# Patient Record
Sex: Male | Born: 1960 | Race: White | Hispanic: No | Marital: Married | Smoking: Never smoker
Health system: Southern US, Community
[De-identification: ages and names within clinical notes are randomized; demographics above are authoritative.]

## PROBLEM LIST (undated history)

## (undated) DIAGNOSIS — G4733 Obstructive sleep apnea (adult) (pediatric): Secondary | ICD-10-CM

## (undated) DIAGNOSIS — G4730 Sleep apnea, unspecified: Secondary | ICD-10-CM

## (undated) DIAGNOSIS — Z87442 Personal history of urinary calculi: Secondary | ICD-10-CM

## (undated) DIAGNOSIS — F419 Anxiety disorder, unspecified: Secondary | ICD-10-CM

## (undated) DIAGNOSIS — C44611 Basal cell carcinoma of skin of unspecified upper limb, including shoulder: Secondary | ICD-10-CM

## (undated) DIAGNOSIS — M9112 Juvenile osteochondrosis of head of femur [Legg-Calve-Perthes], left leg: Secondary | ICD-10-CM

## (undated) DIAGNOSIS — I83899 Varicose veins of unspecified lower extremities with other complications: Secondary | ICD-10-CM

## (undated) DIAGNOSIS — K611 Rectal abscess: Secondary | ICD-10-CM

## (undated) DIAGNOSIS — R7303 Prediabetes: Secondary | ICD-10-CM

## (undated) DIAGNOSIS — I872 Venous insufficiency (chronic) (peripheral): Secondary | ICD-10-CM

## (undated) DIAGNOSIS — T8859XA Other complications of anesthesia, initial encounter: Secondary | ICD-10-CM

## (undated) DIAGNOSIS — N400 Enlarged prostate without lower urinary tract symptoms: Secondary | ICD-10-CM

## (undated) DIAGNOSIS — I10 Essential (primary) hypertension: Secondary | ICD-10-CM

## (undated) DIAGNOSIS — D229 Melanocytic nevi, unspecified: Secondary | ICD-10-CM

## (undated) DIAGNOSIS — K458 Other specified abdominal hernia without obstruction or gangrene: Secondary | ICD-10-CM

## (undated) DIAGNOSIS — E291 Testicular hypofunction: Secondary | ICD-10-CM

## (undated) DIAGNOSIS — E559 Vitamin D deficiency, unspecified: Secondary | ICD-10-CM

## (undated) DIAGNOSIS — E785 Hyperlipidemia, unspecified: Secondary | ICD-10-CM

## (undated) DIAGNOSIS — M6208 Separation of muscle (nontraumatic), other site: Secondary | ICD-10-CM

## (undated) DIAGNOSIS — K5792 Diverticulitis of intestine, part unspecified, without perforation or abscess without bleeding: Secondary | ICD-10-CM

## (undated) DIAGNOSIS — J342 Deviated nasal septum: Secondary | ICD-10-CM

## (undated) DIAGNOSIS — N200 Calculus of kidney: Secondary | ICD-10-CM

## (undated) HISTORY — DX: Sleep apnea, unspecified: G47.30

## (undated) HISTORY — DX: Anxiety disorder, unspecified: F41.9

## (undated) HISTORY — DX: Basal cell carcinoma of skin of unspecified upper limb, including shoulder: C44.611

## (undated) HISTORY — DX: Obstructive sleep apnea (adult) (pediatric): G47.33

## (undated) HISTORY — DX: Varicose veins of unspecified lower extremity with other complications: I83.899

## (undated) HISTORY — DX: Hyperlipidemia, unspecified: E78.5

## (undated) HISTORY — DX: Other specified abdominal hernia without obstruction or gangrene: K45.8

## (undated) HISTORY — DX: Benign prostatic hyperplasia without lower urinary tract symptoms: N40.0

## (undated) HISTORY — DX: Testicular hypofunction: E29.1

## (undated) HISTORY — DX: Deviated nasal septum: J34.2

## (undated) HISTORY — DX: Rectal abscess: K61.1

## (undated) HISTORY — DX: Juvenile osteochondrosis of head of femur (legg-calve-perthes), left leg: M91.12

## (undated) HISTORY — DX: Diverticulitis of intestine, part unspecified, without perforation or abscess without bleeding: K57.92

## (undated) HISTORY — DX: Essential (primary) hypertension: I10

## (undated) HISTORY — DX: Separation of muscle (nontraumatic), other site: M62.08

## (undated) HISTORY — DX: Vitamin D deficiency, unspecified: E55.9

## (undated) HISTORY — DX: Calculus of kidney: N20.0

## (undated) HISTORY — PX: TISSUE GRAFT: SHX251

## (undated) HISTORY — DX: Melanocytic nevi, unspecified: D22.9

## (undated) HISTORY — PX: INCISION AND DRAINAGE PERIRECTAL ABSCESS: SHX1804

## (undated) HISTORY — PX: HERNIA REPAIR: SHX51

## (undated) HISTORY — PX: NASAL SEPTUM SURGERY: SHX37

---
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
## 1996-10-14 HISTORY — PX: INCISION AND DRAINAGE PERIRECTAL ABSCESS: SHX1804

## 2001-10-14 HISTORY — PX: SHOULDER ARTHROSCOPY: SHX128

## 2007-10-15 DIAGNOSIS — C439 Malignant melanoma of skin, unspecified: Secondary | ICD-10-CM

## 2007-10-15 HISTORY — DX: Malignant melanoma of skin, unspecified: C43.9

## 2007-10-15 HISTORY — PX: OTHER SURGICAL HISTORY: SHX169

## 2007-10-15 HISTORY — PX: MELANOMA EXCISION: SHX5266

## 2008-10-14 DIAGNOSIS — M5430 Sciatica, unspecified side: Secondary | ICD-10-CM

## 2008-10-14 HISTORY — DX: Sciatica, unspecified side: M54.30

## 2010-04-13 HISTORY — PX: COLONOSCOPY: SHX174

## 2010-04-13 LAB — HM COLONOSCOPY

## 2013-10-11 ENCOUNTER — Encounter: Payer: Self-pay | Admitting: Internal Medicine

## 2013-10-13 ENCOUNTER — Ambulatory Visit (INDEPENDENT_AMBULATORY_CARE_PROVIDER_SITE_OTHER): Payer: BC Managed Care – PPO | Admitting: Internal Medicine

## 2013-10-13 ENCOUNTER — Encounter: Payer: Self-pay | Admitting: Internal Medicine

## 2013-10-13 VITALS — BP 148/92 | HR 80 | Temp 97.9°F | Resp 18 | Wt 208.6 lb

## 2013-10-13 DIAGNOSIS — E349 Endocrine disorder, unspecified: Secondary | ICD-10-CM | POA: Insufficient documentation

## 2013-10-13 DIAGNOSIS — I10 Essential (primary) hypertension: Secondary | ICD-10-CM

## 2013-10-13 DIAGNOSIS — E782 Mixed hyperlipidemia: Secondary | ICD-10-CM

## 2013-10-13 DIAGNOSIS — G4733 Obstructive sleep apnea (adult) (pediatric): Secondary | ICD-10-CM

## 2013-10-13 DIAGNOSIS — E559 Vitamin D deficiency, unspecified: Secondary | ICD-10-CM

## 2013-10-13 DIAGNOSIS — Z79899 Other long term (current) drug therapy: Secondary | ICD-10-CM

## 2013-10-13 DIAGNOSIS — E291 Testicular hypofunction: Secondary | ICD-10-CM

## 2013-10-13 DIAGNOSIS — R7309 Other abnormal glucose: Secondary | ICD-10-CM

## 2013-10-13 HISTORY — DX: Endocrine disorder, unspecified: E34.9

## 2013-10-13 LAB — HEPATIC FUNCTION PANEL
AST: 23 U/L (ref 0–37)
Albumin: 4.6 g/dL (ref 3.5–5.2)
Bilirubin, Direct: 0.1 mg/dL (ref 0.0–0.3)
Total Bilirubin: 0.4 mg/dL (ref 0.3–1.2)

## 2013-10-13 LAB — LIPID PANEL
Cholesterol: 217 mg/dL — ABNORMAL HIGH (ref 0–200)
HDL: 51 mg/dL (ref >39)
LDL Cholesterol: 117 mg/dL — ABNORMAL HIGH (ref 0–99)
Total CHOL/HDL Ratio: 4.3 Ratio
Triglycerides: 244 mg/dL — ABNORMAL HIGH (ref <150)
VLDL: 49 mg/dL — ABNORMAL HIGH (ref 0–40)

## 2013-10-13 LAB — TESTOSTERONE: Testosterone: 190 ng/dL — ABNORMAL LOW (ref 300–890)

## 2013-10-13 NOTE — Progress Notes (Signed)
Patient ID: Tony Bailey, male   DOB: Jul 30, 1961, 52 y.o.   MRN: [id number]   This very nice 52 y.o. MWM - new patient  presents for 2 month follow up with Hypertension, Hyperlipidemia, Pre-Diabetes and Vitamin D Deficiency.    BP was 146/90 at 1st OV in Oct and is high again today at 148/92 by nurse and recheck by myself is 160/104. Patient denies any cardiac type chest pain, palpitations, dyspnea/orthopnea/PND, dizziness, claudication, or dependent edema.   He had been treated for hyperlipidemia at another office.  At last OV -  Cholesterol was  210, Triglycerides were 192, HDL 50 and LDL 122 and Tx was switched from Pravastatin to alternate day Atorvastatin. Patient denies myalgias or other med SE's.    Also, he was found to have PreDiabetes with an A1c of 5.8%. Patient denies any symptoms of reactive hypoglycemia, diabetic polys, paresthesias or visual blurring.   Further, the patient has Vitamin D Deficiency with a vitamin D of  47 and was advised to begin supplementation. Patient supplements vitamin D without any suspected side-effects.  Medication Sig Dispense Refill  . aspirin 81 MG tablet Take 81 mg by mouth daily.      . Multiple Vitamin (MULTIVITAMIN) tablet Take 1 tablet by mouth daily.       Vitamin D 1000 units 1 tablet bid    . Omega-3 Fatty Acids (FISH OIL) 1000 MG CAPS Take 1,000 mg by mouth 3(three) times daily.          No Known Allergies  PMHx:   Past Medical History  Diagnosis Date  . Hypertension   . Hyperlipidemia   . Kidney stones   . Anxiety    PreDiabetes    Vitamin D Deficiency    Testosterone Deficiency   . OSA (obstructive sleep apnea)     FHx:    Reviewed / unchanged  SHx:    Reviewed / unchanged  Systems Review: Constitutional: Denies fever, chills, wt changes, headaches, insomnia, fatigue, night sweats, change in appetite. Eyes: Denies redness, blurred vision, diplopia, discharge, itchy, watery eyes.  ENT: Denies discharge, congestion, post  nasal drip, epistaxis, sore throat, earache, hearing loss, dental pain, tinnitus, vertigo, sinus pain, snoring.  CV: Denies chest pain, palpitations, irregular heartbeat, syncope, dyspnea, diaphoresis, orthopnea, PND, claudication, edema. Respiratory: denies cough, dyspnea, DOE, pleurisy, hoarseness, laryngitis, wheezing.  Gastrointestinal: Denies dysphagia, odynophagia, heartburn, reflux, water brash, abdominal pain or cramps, nausea, vomiting, bloating, diarrhea, constipation, hematemesis, melena, hematochezia,  or hemorrhoids. Genitourinary: Denies dysuria, frequency, urgency, nocturia, hesitancy, discharge, hematuria, flank pain. Musculoskeletal: Denies arthralgias, myalgias, stiffness, jt. swelling, pain, limp, strain/sprain.  Skin: Denies pruritus, rash, hives, warts, acne, eczema, change in skin lesion(s). Neuro: No weakness, tremor, incoordination, spasms, paresthesia, or pain. Psychiatric: Denies confusion, memory loss, or sensory loss. Endo: Denies change in weight, skin, hair change.  Heme/Lymph: No excessive bleeding, bruising, orenlarged lymph nodes.  BP: 148/92         ----------------rechecked at               160/104  Pulse: 80  Temp: 97.9 F (36.6 C)  Resp: 18      On Exam: Appears well nourished - in no distress. Eyes: PERRLA, EOMs, conjunctiva no swelling or erythema. Sinuses: No frontal/maxillary tenderness ENT/Mouth: EAC's clear, TM's nl w/o erythema, bulging. Nares clear w/o erythema, swelling, exudates. Oropharynx clear without erythema or exudates. Oral hygiene is good. Tongue normal, non obstructing. Hearing intact.  Neck: Supple. Thyroid nl. Car 2+/2+ without bruits,  nodes or JVD. Chest: Respirations nl with BS clear & equal w/o rales, rhonchi, wheezing or stridor.  Cor: Heart sounds normal w/ regular rate and rhythm without sig. murmurs, gallops, clicks, or rubs. Peripheral pulses normal and equal  without edema.  Abdomen: Soft & bowel sounds normal. Non-tender  w/o guarding, rebound, hernias, masses, or organomegaly.  Lymphatics: Unremarkable.  Musculoskeletal: Full ROM all peripheral extremities, joint stability, 5/5 strength, and normal gait.  Skin: Warm, dry without exposed rashes, lesions, ecchymosis apparent.  Neuro: Cranial nerves intact, reflexes equal bilaterally. Sensory-motor testing grossly intact. Tendon reflexes grossly intact.  Pysch: Alert & oriented x 3. Insight and judgement nl & appropriate. No ideations.  Assessment and Plan:  1. Hypertension, labile - Patient defers initiation of treatment until he has opportunity to monitor random BP's at home  2. Hyperlipidemia - Continue diet/meds, exercise,& lifestyle modifications. Continue monitor periodic cholesterol/liver & renal functions   3. Pre-diabetes -  diet, exercise, lifestyle modifications. Recc cinnamon. Monitor appropriate labs.  4. Vitamin D Deficiency - Continue supplementation.  5. OSA on CPAP  Recommended regular exercise, BP monitoring, weight control, and discussed med and SE's. Recommended labs to assess and monitor clinical status. Further disposition pending results of labs.  Approx 40 min spent in chart prep/review, interviewing/examining and counseling the patient.

## 2013-10-13 NOTE — Patient Instructions (Signed)
Hypertension As your heart beats, it forces blood through your arteries. This force is your blood pressure. If the pressure is too high, it is called hypertension (HTN) or high blood pressure. HTN is dangerous because you may have it and not know it. High blood pressure may mean that your heart has to work harder to pump blood. Your arteries may be narrow or stiff. The extra work puts you at risk for heart disease, stroke, and other problems.  Blood pressure consists of two numbers, a higher number over a lower, 110/72, for example. It is stated as "110 over 72." The ideal is below 120 for the top number (systolic) and under 80 for the bottom (diastolic). Write down your blood pressure today. You should pay close attention to your blood pressure if you have certain conditions such as:  Heart failure.  Prior heart attack.  Diabetes  Chronic kidney disease.  Prior stroke.  Multiple risk factors for heart disease. To see if you have HTN, your blood pressure should be measured while you are seated with your arm held at the level of the heart. It should be measured at least twice. A one-time elevated blood pressure reading (especially in the Emergency Department) does not mean that you need treatment. There may be conditions in which the blood pressure is different between your right and left arms. It is important to see your caregiver soon for a recheck. Most people have essential hypertension which means that there is not a specific cause. This type of high blood pressure may be lowered by changing lifestyle factors such as:  Stress.  Smoking.  Lack of exercise.  Excessive weight.  Drug/tobacco/alcohol use.  Eating less salt. Most people do not have symptoms from high blood pressure until it has caused damage to the body. Effective treatment can often prevent, delay or reduce that damage. TREATMENT  When a cause has been identified, treatment for high blood pressure is directed at the  cause. There are a large number of medications to treat HTN. These fall into several categories, and your caregiver will help you select the medicines that are best for you. Medications may have side effects. You should review side effects with your caregiver. If your blood pressure stays high after you have made lifestyle changes or started on medicines,   Your medication(s) may need to be changed.  Other problems may need to be addressed.  Be certain you understand your prescriptions, and know how and when to take your medicine.  Be sure to follow up with your caregiver within the time frame advised (usually within two weeks) to have your blood pressure rechecked and to review your medications.  If you are taking more than one medicine to lower your blood pressure, make sure you know how and at what times they should be taken. Taking two medicines at the same time can result in blood pressure that is too low. SEEK IMMEDIATE MEDICAL CARE IF:  You develop a severe headache, blurred or changing vision, or confusion.  You have unusual weakness or numbness, or a faint feeling.  You have severe chest or abdominal pain, vomiting, or breathing problems. MAKE SURE YOU:   Understand these instructions.  Will watch your condition.  Will get help right away if you are not doing well or get worse. Document Released: 09/30/2005 Document Revised: 12/23/2011 Document Reviewed: 05/20/2008 [facility] Patient Information 2014 [place], [place].  Vitamin D Deficiency Vitamin D is an important vitamin that your body needs. Having too  little of it in your body is called a deficiency. A very bad deficiency can make your bones soft and can cause a condition called rickets.  Vitamin D is important to your body for different reasons, such as:   It helps your body absorb 2 minerals called calcium and phosphorus.  It helps make your bones healthy.  It may prevent some diseases, such as diabetes and multiple  sclerosis.  It helps your muscles and heart. You can get vitamin D in several ways. It is a natural part of some foods. The vitamin is also added to some dairy products and cereals. Some people take vitamin D supplements. Also, your body makes vitamin D when you are in the sun. It changes the sun's rays into a form of the vitamin that your body can use. CAUSES   Not eating enough foods that contain vitamin D.  Not getting enough sunlight.  Having certain digestive system diseases that make it hard to absorb vitamin D. These diseases include Crohn's disease, chronic pancreatitis, and cystic fibrosis.  Having a surgery in which part of the stomach or small intestine is removed.  Being obese. Fat cells pull vitamin D out of your blood. That means that obese people may not have enough vitamin D left in their blood and in other body tissues.  Having chronic kidney or liver disease. RISK FACTORS Risk factors are things that make you more likely to develop a vitamin D deficiency. They include:  Being older.  Not being able to get outside very much.  Living in a nursing home.  Having had broken bones.  Having weak or thin bones (osteoporosis).  Having a disease or condition that changes how your body absorbs vitamin D.  Having dark skin.  Some medicines such as seizure medicines or steroids.  Being overweight or obese. SYMPTOMS Mild cases of vitamin D deficiency may not have any symptoms. If you have a very bad case, symptoms may include:  Bone pain.  Muscle pain.  Falling often.  Broken bones caused by a minor injury, due to osteoporosis. DIAGNOSIS A blood test is the best way to tell if you have a vitamin D deficiency. TREATMENT Vitamin D deficiency can be treated in different ways. Treatment for vitamin D deficiency depends on what is causing it. Options include:  Taking vitamin D supplements.  Taking a calcium supplement. Your caregiver will suggest what dose is best  for you. HOME CARE INSTRUCTIONS  Take any supplements that your caregiver prescribes. Follow the directions carefully. Take only the suggested amount.  Have your blood tested 2 months after you start taking supplements.  Eat foods that contain vitamin D. Healthy choices include:  Fortified dairy products, cereals, or juices. Fortified means vitamin D has been added to the food. Check the label on the package to be sure.  Fatty fish like salmon or trout.  Eggs.  Oysters.  Do not use a tanning bed.  Keep your weight at a healthy level. Lose weight if you need to.  Keep all follow-up appointments. Your caregiver will need to perform blood tests to make sure your vitamin D deficiency is going away. SEEK MEDICAL CARE IF:  You have any questions about your treatment.  You continue to have symptoms of vitamin D deficiency.  You have nausea or vomiting.  You are constipated.  You feel confused.  You have severe abdominal or back pain. MAKE SURE YOU:  Understand these instructions.  Will watch your condition.  Will get  help right away if you are not doing well or get worse. Document Released: 12/23/2011 Document Revised: 01/25/2013 Document Reviewed: 12/23/2011 [facility] Patient Information 2014 [place], [place].  Testosterone This test is used to determine if your testosterone level is abnormal. This could be used to explain difficulty getting an erection (erectile dysfunction), inability of your partner to get pregnant (infertility), premature or delayed puberty if you are male, or the appearance of masculine physical features if you are male. PREPARATION FOR TEST A blood sample is obtained by inserting a needle into a vein in the arm. NORMAL FINDINGS  Free Testosterone: 0.3-2 pg/mL  % Free Testosterone: 0.1%-0.3% Total Testosterone:  7 mos-9 yrs (Tanner Stage I)  Male: Less than 30 ng/dL  Male: Less than 30 ng/dL  16-10 yrs (Tanner Stage II)  Male: Less  than 300 ng/dL  Male: Less than 40 ng/dL  96-04 yrs (Tanner Stage III)  Male: 170-540 ng/dL  Male: Less than 60 ng/dL  54-09 yrs (Tanner Stage IV, V)  Male: 250-910 ng/dL  Male: Less than 70 ng/dL  20 yrs and over  Male: [phone] ng/dL  Male: Less than 70 ng/dL Ranges for normal findings may vary among different laboratories and hospitals. You should always check with your doctor after having lab work or other tests done to discuss the meaning of your test results and whether your values are considered within normal limits. MEANING OF TEST  Your caregiver will go over the test results with you and discuss the importance and meaning of your results, as well as treatment options and the need for additional tests if necessary. OBTAINING THE TEST RESULTS It is your responsibility to obtain your test results. Ask the lab or department performing the test when and how you will get your results. Document Released: 10/17/2004 Document Revised: 12/23/2011 Document Reviewed: 09/11/2008 [facility] Patient Information 2014 [place], [place].

## 2013-10-14 LAB — VITAMIN D 25 HYDROXY (VIT D DEFICIENCY, FRACTURES): Vit D, 25-Hydroxy: 56 ng/mL (ref 30–89)

## 2013-10-19 ENCOUNTER — Telehealth: Payer: Self-pay | Admitting: *Deleted

## 2013-10-19 NOTE — Telephone Encounter (Signed)
Message copied by Emelda Brothers on Tue Oct 19, 2013 10:58 AM ------      Message from: [name]      Created: Thu Oct 14, 2013  8:30 PM       Liver panel ok      Chol 210 -> 210 still high & LDL 117 -> 122 also still high       Recc increase Atorvastatin 80 from 1/2 tab to a whole tablet  3 x Week  - tu -  thurs -sat      Vit D better from 47  To 56 but still low - ideal is betw 70-90  --- Recc increase Vit D to 5000 units EVERY day       ------

## 2013-11-09 ENCOUNTER — Ambulatory Visit (INDEPENDENT_AMBULATORY_CARE_PROVIDER_SITE_OTHER): Payer: BC Managed Care – PPO | Admitting: Internal Medicine

## 2013-11-09 ENCOUNTER — Encounter: Payer: Self-pay | Admitting: Internal Medicine

## 2013-11-09 VITALS — BP 154/102 | Temp 98.1°F | Resp 16 | Wt 207.8 lb

## 2013-11-09 DIAGNOSIS — I10 Essential (primary) hypertension: Secondary | ICD-10-CM

## 2013-11-09 DIAGNOSIS — E782 Mixed hyperlipidemia: Secondary | ICD-10-CM

## 2013-11-09 DIAGNOSIS — E559 Vitamin D deficiency, unspecified: Secondary | ICD-10-CM

## 2013-11-09 DIAGNOSIS — Z79899 Other long term (current) drug therapy: Secondary | ICD-10-CM | POA: Insufficient documentation

## 2013-11-09 MED ORDER — BISOPROLOL-HYDROCHLOROTHIAZIDE 5-6.25 MG PO TABS: 1.0000 | ORAL_TABLET | Freq: Every day | ORAL | Status: DC

## 2013-11-09 NOTE — Progress Notes (Signed)
Patient ID: Tony Bailey, male   DOB: 10-Apr-1961, 53 y.o.   MRN: [id number]   This very nice 53 y.o. MWM presents for  month follow up with Hypertension, Hyperlipidemia, OSA/CPAP, Pre-Diabetes and Vitamin D Deficiency.    HTN predates since Oct 2014. At last visit BP was 148/92 and rechecked at 160/104. Today's BP: 154/102 mmHg. Reviewed list of patient's BP's and they range 145-170+/ 80-95. Patient denies any cardiac type chest pain, palpitations, dyspnea/orthopnea/PND, dizziness, claudication, or dependent edema.   Hyperlipidemia is controlled with diet & meds. Last Cholesterol was  217, Triglycerides were  244, HDL 51  and LDL 117 in Dec 2014 and he was switched from Simvastatin to Atorvastatin 80 x 1/2 TIW. Patient denies myalgias or other med SE's.    Also, the patient has history of PreDiabetes since 07/2013 with last A1c of 5.8%.  He recently started Cinnamon for his insulin sensitivity. Patient denies any symptoms of reactive hypoglycemia, diabetic polys, paresthesias or visual blurring.   Further, Patient has history of Vitamin D Deficiency (47) with last vitamin D of 56 with recommendations to increase to 5,000 u/da. Patient supplements vitamin D without any suspected side-effects.    Medication List       aspirin 81 MG tablet  Take 81 mg by mouth daily.     atorvastatin 80 MG tablet  Commonly known as:  LIPITOR  Take 80 mg by mouth daily. Takes 1/2 tab on Tues Thur and Sat     bisoprolol-hydrochlorothiazide 5-6.25 MG per tablet  Commonly known as:  ZIAC  Take 1 tablet by mouth daily. For BP     cholecalciferol 1000 UNITS tablet  Commonly known as:  VITAMIN D  Take 1,000 Units by mouth 4 (four) times daily.     Cinnamon 500 MG capsule  Take 500 mg by mouth 4 (four) times daily.     Fish Oil 1000 MG Caps  Take 1,000 mg by mouth 3 (three) times daily.     fluticasone 50 MCG/ACT nasal spray  Commonly known as:  FLONASE  Place 1 spray into both nostrils daily. At bedtime      multivitamin tablet  Take 1 tablet by mouth daily.         No Known Allergies  PMHx:   Past Medical History  Diagnosis Date  . Hypertension   . Hyperlipidemia   . Kidney stones   . Anxiety   . OSA (obstructive sleep apnea)   . Unspecified vitamin D deficiency   . Other testicular hypofunction     FHx:    Reviewed / unchanged  SHx:    Reviewed / unchanged  Systems Review: Constitutional: Denies fever, chills, wt changes, headaches, insomnia, fatigue, night sweats, change in appetite. Eyes: Denies redness, blurred vision, diplopia, discharge, itchy, watery eyes.  ENT: Denies discharge, congestion, post nasal drip, epistaxis, sore throat, earache, hearing loss, dental pain, tinnitus, vertigo, sinus pain, snoring.  CV: Denies chest pain, palpitations, irregular heartbeat, syncope, dyspnea, diaphoresis, orthopnea, PND, claudication, edema. Respiratory: denies cough, dyspnea, DOE, pleurisy, hoarseness, laryngitis, wheezing.  Gastrointestinal: Denies dysphagia, odynophagia, heartburn, reflux, water brash, abdominal pain or cramps, nausea, vomiting, bloating, diarrhea, constipation, hematemesis, melena, hematochezia,  or hemorrhoids. Genitourinary: Denies dysuria, frequency, urgency, nocturia, hesitancy, discharge, hematuria, flank pain. Musculoskeletal: Denies arthralgias, myalgias, stiffness, jt. swelling, pain, limp, strain/sprain.  Skin: Denies pruritus, rash, hives, warts, acne, eczema, change in skin lesion(s). Neuro: No weakness, tremor, incoordination, spasms, paresthesia, or pain. Psychiatric: Denies confusion, memory loss, or sensory loss.  Endo: Denies change in weight, skin, hair change.  Heme/Lymph: No excessive bleeding, bruising, orenlarged lymph nodes.  BP: 154/102  Temp: 98.1 F (36.7 C)  Resp: 16     On Exam: Appears well nourished - in no distress. Eyes: PERRLA, EOMs, conjunctiva no swelling or erythema. Sinuses: No frontal/maxillary  tenderness ENT/Mouth: EAC's clear, TM's nl w/o erythema, bulging. Nares clear w/o erythema, swelling, exudates. Oropharynx clear without erythema or exudates. Oral hygiene is good. Tongue normal, non obstructing. Hearing intact.  Neck: Supple. Thyroid nl. Car 2+/2+ without bruits, nodes or JVD. Chest: Respirations nl with BS clear & equal w/o rales, rhonchi, wheezing or stridor.  Cor: Heart sounds normal w/ regular rate and rhythm without sig. murmurs, gallops, clicks, or rubs. Peripheral pulses normal and equal  without edema.  Musculoskeletal: Full ROM all peripheral extremities, joint stability, 5/5 strength, and normal gait.  Skin: Warm, dry without exposed rashes, lesions, ecchymosis apparent.  Neuro: Cranial nerves intact, reflexes equal bilaterally. Sensory-motor testing grossly intact. Tendon reflexes grossly intact.  Pysch: Alert & oriented x 3. Insight and judgement nl & appropriate. No ideations.  Assessment and Plan:  1. Hypertension - Continue monitor blood pressure at home. Continue diet/meds same.  2. Hyperlipidemia - Continue diet/meds, exercise,& lifestyle modifications. Continue monitor periodic cholesterol/liver & renal functions   3. Pre-diabetes - Continue diet, exercise, lifestyle modifications. Monitor appropriate labs.  4. Vitamin D Deficiency - Continue supplementation.  5. OSA/CPAP  Recommended regular exercise, BP monitoring, weight control, and discussed med and SE's. Recommended labs to assess and monitor clinical status. Further disposition pending results of labs ( Lipid profile and HFP today) and plann 3 month F/U.

## 2013-11-09 NOTE — Patient Instructions (Signed)
Cholesterol Cholesterol is a white, waxy, fat-like protein needed by your body in small amounts. The liver makes all the cholesterol you need. It is carried from the liver by the blood through the blood vessels. Deposits (plaque) may build up on blood vessel walls. This makes the arteries narrower and stiffer. Plaque increases the risk for heart attack and stroke. You cannot feel your cholesterol level even if it is very high. The only way to know is by a blood test to check your lipid (fats) levels. Once you know your cholesterol levels, you should keep a record of the test results. Work with your caregiver to to keep your levels in the desired range. WHAT THE RESULTS MEAN:  Total cholesterol is a rough measure of all the cholesterol in your blood.  LDL is the so-called bad cholesterol. This is the type that deposits cholesterol in the walls of the arteries. You want this level to be low.  HDL is the good cholesterol because it cleans the arteries and carries the LDL away. You want this level to be high.  Triglycerides are fat that the body can either burn for energy or store. High levels are closely linked to heart disease. DESIRED LEVELS:  Total cholesterol below 200.  LDL below 100 for people at risk, below 70 for very high risk.  HDL above 50 is good, above 60 is best.  Triglycerides below 150. HOW TO LOWER YOUR CHOLESTEROL:  Diet.  Choose fish or white meat chicken and [place], roasted or baked. Limit fatty cuts of red meat, fried foods, and processed meats, such as sausage and lunch meat.  Eat lots of fresh fruits and vegetables. Choose whole grains, beans, pasta, potatoes and cereals.  Use only small amounts of olive, corn or canola oils. Avoid butter, mayonnaise, shortening or palm kernel oils. Avoid foods with trans-fats.  Use skim/nonfat milk and low-fat/nonfat yogurt and cheeses. Avoid whole milk, cream, ice cream, egg yolks and cheeses. Healthy desserts include angel food  cake, ginger snaps, animal crackers, hard candy, popsicles, and low-fat/nonfat frozen yogurt. Avoid pastries, cakes, pies and cookies.  Exercise.  A regular program helps decrease LDL and raises HDL.  Helps with weight control.  Do things that increase your activity level like gardening, walking, or taking the stairs.  Medication.  May be prescribed by your caregiver to help lowering cholesterol and the risk for heart disease.  You may need medicine even if your levels are normal if you have several risk factors. HOME CARE INSTRUCTIONS   Follow your diet and exercise programs as suggested by your caregiver.  Take medications as directed.  Have blood work done when your caregiver feels it is necessary. MAKE SURE YOU:   Understand these instructions.  Will watch your condition.  Will get help right away if you are not doing well or get worse. Document Released: 06/25/2001 Document Revised: 12/23/2011 Document Reviewed: 07/14/2013 [facility] Patient Information 2014 [place], [place]. Hypertension As your heart beats, it forces blood through your arteries. This force is your blood pressure. If the pressure is too high, it is called hypertension (HTN) or high blood pressure. HTN is dangerous because you may have it and not know it. High blood pressure may mean that your heart has to work harder to pump blood. Your arteries may be narrow or stiff. The extra work puts you at risk for heart disease, stroke, and other problems.  Blood pressure consists of two numbers, a higher number over a lower, 110/72, for example.  It is stated as "110 over 72." The ideal is below 120 for the top number (systolic) and under 80 for the bottom (diastolic). Write down your blood pressure today. You should pay close attention to your blood pressure if you have certain conditions such as:  Heart failure.  Prior heart attack.  Diabetes  Chronic kidney disease.  Prior stroke.  Multiple risk factors for  heart disease. To see if you have HTN, your blood pressure should be measured while you are seated with your arm held at the level of the heart. It should be measured at least twice. A one-time elevated blood pressure reading (especially in the Emergency Department) does not mean that you need treatment. There may be conditions in which the blood pressure is different between your right and left arms. It is important to see your caregiver soon for a recheck. Most people have essential hypertension which means that there is not a specific cause. This type of high blood pressure may be lowered by changing lifestyle factors such as:  Stress.  Smoking.  Lack of exercise.  Excessive weight.  Drug/tobacco/alcohol use.  Eating less salt. Most people do not have symptoms from high blood pressure until it has caused damage to the body. Effective treatment can often prevent, delay or reduce that damage. TREATMENT  When a cause has been identified, treatment for high blood pressure is directed at the cause. There are a large number of medications to treat HTN. These fall into several categories, and your caregiver will help you select the medicines that are best for you. Medications may have side effects. You should review side effects with your caregiver. If your blood pressure stays high after you have made lifestyle changes or started on medicines,   Your medication(s) may need to be changed.  Other problems may need to be addressed.  Be certain you understand your prescriptions, and know how and when to take your medicine.  Be sure to follow up with your caregiver within the time frame advised (usually within two weeks) to have your blood pressure rechecked and to review your medications.  If you are taking more than one medicine to lower your blood pressure, make sure you know how and at what times they should be taken. Taking two medicines at the same time can result in blood pressure that is  too low. SEEK IMMEDIATE MEDICAL CARE IF:  You develop a severe headache, blurred or changing vision, or confusion.  You have unusual weakness or numbness, or a faint feeling.  You have severe chest or abdominal pain, vomiting, or breathing problems. MAKE SURE YOU:   Understand these instructions.  Will watch your condition.  Will get help right away if you are not doing well or get worse. Document Released: 09/30/2005 Document Revised: 12/23/2011 Document Reviewed: 05/20/2008 [facility] Patient Information 2014 [place].

## 2013-11-10 LAB — HEPATIC FUNCTION PANEL
ALBUMIN: 4.8 g/dL (ref 3.5–5.2)
ALT: 29 U/L (ref 0–53)
AST: 22 U/L (ref 0–37)
Alkaline Phosphatase: 56 U/L (ref 39–117)
BILIRUBIN INDIRECT: 0.3 mg/dL (ref 0.0–0.9)
Bilirubin, Direct: 0.1 mg/dL (ref 0.0–0.3)
TOTAL PROTEIN: 7.3 g/dL (ref 6.0–8.3)
Total Bilirubin: 0.4 mg/dL (ref 0.3–1.2)

## 2013-11-10 LAB — LIPID PANEL
CHOLESTEROL: 180 mg/dL (ref 0–200)
HDL: 48 mg/dL (ref >39)
LDL Cholesterol: 75 mg/dL (ref 0–99)
TRIGLYCERIDES: 284 mg/dL — AB (ref <150)
Total CHOL/HDL Ratio: 3.8 Ratio
VLDL: 57 mg/dL — ABNORMAL HIGH (ref 0–40)

## 2013-11-24 ENCOUNTER — Ambulatory Visit: Payer: Self-pay | Admitting: Internal Medicine

## 2014-02-15 ENCOUNTER — Ambulatory Visit: Payer: Self-pay | Admitting: Physician Assistant

## 2014-02-22 ENCOUNTER — Ambulatory Visit: Payer: Self-pay | Admitting: Physician Assistant

## 2014-03-15 ENCOUNTER — Ambulatory Visit: Payer: Self-pay | Admitting: Physician Assistant

## 2014-05-17 ENCOUNTER — Ambulatory Visit: Payer: Self-pay | Admitting: Internal Medicine

## 2014-05-17 ENCOUNTER — Encounter: Payer: Self-pay | Admitting: Internal Medicine

## 2014-05-17 ENCOUNTER — Ambulatory Visit (INDEPENDENT_AMBULATORY_CARE_PROVIDER_SITE_OTHER): Payer: BC Managed Care – PPO | Admitting: Internal Medicine

## 2014-05-17 VITALS — BP 112/70 | HR 76 | Temp 98.2°F | Resp 16 | Ht 74.0 in | Wt 206.2 lb

## 2014-05-17 DIAGNOSIS — E782 Mixed hyperlipidemia: Secondary | ICD-10-CM

## 2014-05-17 DIAGNOSIS — R7309 Other abnormal glucose: Secondary | ICD-10-CM

## 2014-05-17 DIAGNOSIS — E559 Vitamin D deficiency, unspecified: Secondary | ICD-10-CM

## 2014-05-17 DIAGNOSIS — I10 Essential (primary) hypertension: Secondary | ICD-10-CM

## 2014-05-17 DIAGNOSIS — Z79899 Other long term (current) drug therapy: Secondary | ICD-10-CM

## 2014-05-17 LAB — CBC WITH DIFFERENTIAL/PLATELET
Basophils Absolute: 0.1 10*3/uL (ref 0.0–0.1)
Basophils Relative: 1 % (ref 0–1)
Eosinophils Absolute: 0.1 10*3/uL (ref 0.0–0.7)
Eosinophils Relative: 1 % (ref 0–5)
HEMATOCRIT: 42.1 % (ref 39.0–52.0)
HEMOGLOBIN: 14.9 g/dL (ref 13.0–17.0)
Lymphocytes Relative: 32 % (ref 12–46)
Lymphs Abs: 2.1 10*3/uL (ref 0.7–4.0)
MCH: 30.9 pg (ref 26.0–34.0)
MCHC: 35.4 g/dL (ref 30.0–36.0)
MCV: 87.3 fL (ref 78.0–100.0)
MONO ABS: 0.5 10*3/uL (ref 0.1–1.0)
MONOS PCT: 7 % (ref 3–12)
NEUTROS ABS: 3.9 10*3/uL (ref 1.7–7.7)
Neutrophils Relative %: 59 % (ref 43–77)
Platelets: 321 10*3/uL (ref 150–400)
RBC: 4.82 MIL/uL (ref 4.22–5.81)
RDW: 12.9 % (ref 11.5–15.5)
WBC: 6.6 10*3/uL (ref 4.0–10.5)

## 2014-05-17 LAB — HEMOGLOBIN A1C
Hgb A1c MFr Bld: 5.8 % — ABNORMAL HIGH (ref <5.7)
Mean Plasma Glucose: 120 mg/dL — ABNORMAL HIGH (ref <117)

## 2014-05-17 NOTE — Patient Instructions (Signed)

## 2014-05-17 NOTE — Progress Notes (Signed)
Patient ID: Tony Bailey, male   DOB: 1961-04-09, 53 y.o.   MRN: [id number]   This very nice 53 y.o.male presents for 3 month follow up with Hypertension, Hyperlipidemia, Pre-Diabetes and Vitamin D Deficiency.    Patient is treated for HTN & BP has been controlled at home. Today's BP: 112/70 mmHg. Patient denies any cardiac type chest pain, palpitations, dyspnea/orthopnea/PND, dizziness, claudication, or dependent edema.   Hyperlipidemia is controlled with diet & meds. Patient denies myalgias or other med SE's. Last Lipids were 11/09/2013: Cholesterol, Total 180; HDL Cholesterol by NMR 48; LDL (calc) 75; Triglycerides 284*   Also, the patient has history of PreDiabetes and patient denies any symptoms of reactive hypoglycemia, diabetic polys, paresthesias or visual blurring.  Last A1c was No results found for requested labs within last 365 days.   Further, Patient has history of Vitamin D Deficiency and patient supplements vitamin D without any suspected side-effects. Last vitamin D was 10/13/2013: Vit D, 25-Hydroxy 56   Medication List   aspirin 81 MG tablet  Take 81 mg by mouth daily.     atorvastatin 80 MG tablet  Commonly known as:  LIPITOR  Take 80 mg by mouth daily. Takes 1 tab on Tues Thur and Sat     bisoprolol-hydrochlorothiazide 5-6.25 MG per tablet  Commonly known as:  ZIAC  Take 1 tablet by mouth daily. For BP     Cinnamon 500 MG capsule  Take 500 mg by mouth 4 (four) times daily.     Fish Oil 1000 MG Caps  Take 1,400 mg by mouth 2 (two) times daily.     fluticasone 50 MCG/ACT nasal spray  Commonly known as:  FLONASE  Place 1 spray into both nostrils daily. At bedtime     multivitamin tablet  Take 1 tablet by mouth daily.     VITAMIN D-3 PO  Take 2,000 Units by mouth 2 (two) times daily.       No Known Allergies  PMHx:   Past Medical History  Diagnosis Date  . Hypertension   . Hyperlipidemia   . Kidney stones   . Anxiety   . OSA (obstructive sleep apnea)    . Unspecified vitamin D deficiency   . Other testicular hypofunction    FHx:    Reviewed / unchanged SHx:    Reviewed / unchanged  Systems Review:  Constitutional: Denies fever, chills, wt changes, headaches, insomnia, fatigue, night sweats, change in appetite. Eyes: Denies redness, blurred vision, diplopia, discharge, itchy, watery eyes.  ENT: Denies discharge, congestion, post nasal drip, epistaxis, sore throat, earache, hearing loss, dental pain, tinnitus, vertigo, sinus pain, snoring.  CV: Denies chest pain, palpitations, irregular heartbeat, syncope, dyspnea, diaphoresis, orthopnea, PND, claudication or edema. Respiratory: denies cough, dyspnea, DOE, pleurisy, hoarseness, laryngitis, wheezing.  Gastrointestinal: Denies dysphagia, odynophagia, heartburn, reflux, water brash, abdominal pain or cramps, nausea, vomiting, bloating, diarrhea, constipation, hematemesis, melena, hematochezia  or hemorrhoids. Genitourinary: Denies dysuria, frequency, urgency, nocturia, hesitancy, discharge, hematuria or flank pain. Musculoskeletal: Denies arthralgias, myalgias, stiffness, jt. swelling, pain, limping or strain/sprain.  Skin: Denies pruritus, rash, hives, warts, acne, eczema or change in skin lesion(s). Neuro: No weakness, tremor, incoordination, spasms, paresthesia or pain. Psychiatric: Denies confusion, memory loss or sensory loss. Endo: Denies change in weight, skin or hair change.  Heme/Lymph: No excessive bleeding, bruising or enlarged lymph nodes.  Exam:  BP 112/70  Pulse 76  Temp(Src) 98.2 F (36.8 C) (Temporal)  Resp 16  Ht 6\' 2"  (1.88 m)  Wt 206  lb 3.2 oz (93.532 kg)  BMI 26.46 kg/m2  Appears well nourished and in no distress. Eyes: PERRLA, EOMs, conjunctiva no swelling or erythema. Sinuses: No frontal/maxillary tenderness ENT/Mouth: EAC's clear, TM's nl w/o erythema, bulging. Nares clear w/o erythema, swelling, exudates. Oropharynx clear without erythema or exudates. Oral  hygiene is good. Tongue normal, non obstructing. Hearing intact.  Neck: Supple. Thyroid nl. Car 2+/2+ without bruits, nodes or JVD. Chest: Respirations nl with BS clear & equal w/o rales, rhonchi, wheezing or stridor.  Cor: Heart sounds normal w/ regular rate and rhythm without sig. murmurs, gallops, clicks, or rubs. Peripheral pulses normal and equal  without edema.  Abdomen: Soft & bowel sounds normal. Non-tender w/o guarding, rebound, hernias, masses, or organomegaly.  Lymphatics: Unremarkable.  Musculoskeletal: Full ROM all peripheral extremities, joint stability, 5/5 strength, and normal gait.  Skin: Warm, dry without exposed rashes, lesions or ecchymosis apparent.  Neuro: Cranial nerves intact, reflexes equal bilaterally. Sensory-motor testing grossly intact. Tendon reflexes grossly intact.  Pysch: Alert & oriented x 3. Insight and judgement nl & appropriate. No ideations.  Assessment and Plan:  1. Hypertension - Continue monitor blood pressure at home. Continue diet/meds same.  2. Hyperlipidemia - Continue diet/meds, exercise,& lifestyle modifications. Continue monitor periodic cholesterol/liver & renal functions   3. Pre-Diabetes - Continue diet, exercise, lifestyle modifications. Monitor appropriate labs.  4. Vitamin D Deficiency - Continue supplementation.  Recommended regular exercise, BP monitoring, weight control, and discussed med and SE's. Recommended labs to assess and monitor clinical status. Further disposition pending results of labs.

## 2014-05-18 LAB — HEPATIC FUNCTION PANEL
ALT: 24 U/L (ref 0–53)
AST: 20 U/L (ref 0–37)
Albumin: 4.8 g/dL (ref 3.5–5.2)
Alkaline Phosphatase: 48 U/L (ref 39–117)
BILIRUBIN DIRECT: 0.1 mg/dL (ref 0.0–0.3)
BILIRUBIN TOTAL: 0.6 mg/dL (ref 0.2–1.2)
Indirect Bilirubin: 0.5 mg/dL (ref 0.2–1.2)
Total Protein: 7.1 g/dL (ref 6.0–8.3)

## 2014-05-18 LAB — LIPID PANEL
Cholesterol: 191 mg/dL (ref 0–200)
HDL: 48 mg/dL (ref >39)
LDL Cholesterol: 90 mg/dL (ref 0–99)
Total CHOL/HDL Ratio: 4 Ratio
Triglycerides: 266 mg/dL — ABNORMAL HIGH (ref <150)
VLDL: 53 mg/dL — ABNORMAL HIGH (ref 0–40)

## 2014-05-18 LAB — VITAMIN D 25 HYDROXY (VIT D DEFICIENCY, FRACTURES): Vit D, 25-Hydroxy: 66 ng/mL (ref 30–89)

## 2014-05-18 LAB — MAGNESIUM: MAGNESIUM: 2.1 mg/dL (ref 1.5–2.5)

## 2014-05-18 LAB — BASIC METABOLIC PANEL WITH GFR
BUN: 19 mg/dL (ref 6–23)
CO2: 30 mEq/L (ref 19–32)
Calcium: 9.9 mg/dL (ref 8.4–10.5)
Chloride: 100 mEq/L (ref 96–112)
Creat: 0.94 mg/dL (ref 0.50–1.35)
GFR, Est African American: >89 mL/min
GLUCOSE: 139 mg/dL — AB (ref 70–99)
POTASSIUM: 4.2 meq/L (ref 3.5–5.3)
Sodium: 139 mEq/L (ref 135–145)

## 2014-05-18 LAB — TSH: TSH: 2.116 u[IU]/mL (ref 0.350–4.500)

## 2014-05-18 LAB — INSULIN, FASTING: INSULIN FASTING, SERUM: 78 u[IU]/mL — AB (ref 3–28)

## 2014-08-23 ENCOUNTER — Encounter: Payer: Self-pay | Admitting: Internal Medicine

## 2014-08-23 ENCOUNTER — Ambulatory Visit (INDEPENDENT_AMBULATORY_CARE_PROVIDER_SITE_OTHER): Payer: BC Managed Care – PPO | Admitting: Internal Medicine

## 2014-08-23 VITALS — BP 110/64 | HR 72 | Temp 97.6°F | Resp 16 | Ht 73.25 in | Wt 207.6 lb

## 2014-08-23 DIAGNOSIS — G4733 Obstructive sleep apnea (adult) (pediatric): Secondary | ICD-10-CM

## 2014-08-23 DIAGNOSIS — E782 Mixed hyperlipidemia: Secondary | ICD-10-CM

## 2014-08-23 DIAGNOSIS — Z1212 Encounter for screening for malignant neoplasm of rectum: Secondary | ICD-10-CM

## 2014-08-23 DIAGNOSIS — Z113 Encounter for screening for infections with a predominantly sexual mode of transmission: Secondary | ICD-10-CM

## 2014-08-23 DIAGNOSIS — R945 Abnormal results of liver function studies: Secondary | ICD-10-CM

## 2014-08-23 DIAGNOSIS — I10 Essential (primary) hypertension: Secondary | ICD-10-CM

## 2014-08-23 DIAGNOSIS — Z125 Encounter for screening for malignant neoplasm of prostate: Secondary | ICD-10-CM

## 2014-08-23 DIAGNOSIS — R6889 Other general symptoms and signs: Secondary | ICD-10-CM

## 2014-08-23 DIAGNOSIS — E291 Testicular hypofunction: Secondary | ICD-10-CM

## 2014-08-23 DIAGNOSIS — Z111 Encounter for screening for respiratory tuberculosis: Secondary | ICD-10-CM

## 2014-08-23 DIAGNOSIS — E349 Endocrine disorder, unspecified: Secondary | ICD-10-CM

## 2014-08-23 DIAGNOSIS — R7303 Prediabetes: Secondary | ICD-10-CM

## 2014-08-23 DIAGNOSIS — Z0001 Encounter for general adult medical examination with abnormal findings: Secondary | ICD-10-CM

## 2014-08-23 DIAGNOSIS — R7989 Other specified abnormal findings of blood chemistry: Secondary | ICD-10-CM

## 2014-08-23 DIAGNOSIS — E559 Vitamin D deficiency, unspecified: Secondary | ICD-10-CM

## 2014-08-23 DIAGNOSIS — Z23 Encounter for immunization: Secondary | ICD-10-CM

## 2014-08-23 DIAGNOSIS — Z79899 Other long term (current) drug therapy: Secondary | ICD-10-CM

## 2014-08-23 DIAGNOSIS — Z9989 Dependence on other enabling machines and devices: Secondary | ICD-10-CM

## 2014-08-23 LAB — CBC WITH DIFFERENTIAL/PLATELET
Basophils Absolute: 0.1 10*3/uL (ref 0.0–0.1)
Basophils Relative: 1 % (ref 0–1)
Eosinophils Absolute: 0.1 10*3/uL (ref 0.0–0.7)
Eosinophils Relative: 1 % (ref 0–5)
HCT: 41.6 % (ref 39.0–52.0)
Hemoglobin: 14.7 g/dL (ref 13.0–17.0)
Lymphocytes Relative: 23 % (ref 12–46)
Lymphs Abs: 1.7 10*3/uL (ref 0.7–4.0)
MCH: 30.9 pg (ref 26.0–34.0)
MCHC: 35.3 g/dL (ref 30.0–36.0)
MCV: 87.4 fL (ref 78.0–100.0)
Monocytes Absolute: 0.8 10*3/uL (ref 0.1–1.0)
Monocytes Relative: 10 % (ref 3–12)
Neutro Abs: 4.9 10*3/uL (ref 1.7–7.7)
Neutrophils Relative %: 65 % (ref 43–77)
Platelets: 346 10*3/uL (ref 150–400)
RBC: 4.76 MIL/uL (ref 4.22–5.81)
RDW: 12.6 % (ref 11.5–15.5)
WBC: 7.5 10*3/uL (ref 4.0–10.5)

## 2014-08-23 LAB — HEMOGLOBIN A1C
Hgb A1c MFr Bld: 5.7 % — ABNORMAL HIGH (ref <5.7)
Mean Plasma Glucose: 117 mg/dL — ABNORMAL HIGH (ref <117)

## 2014-08-23 NOTE — Progress Notes (Signed)
Patient ID: Tony Bailey, male   DOB: [birth year], 53 y.o.   MRN: [id number]  Annual Screening Comprehensive Examination  This very nice 53 y.o.MWM presents for complete physical.  Patient has been followed for HTN, Prediabetes, Hyperlipidemia, and Vitamin D Deficiency.   Patient has been on Ziac for HTN since Dec 2014. Patient's BP has been controlled at home.Today's BP: 110/64 mmHg. Patient denies any cardiac symptoms as chest pain, palpitations, shortness of breath, dizziness or ankle swelling.   Patient's hyperlipidemia is controlled with diet and medications. Patient denies myalgias or other medication SE's. Last lipids were at goal with Total Chol 191; HDL 48; LDL 90; but elevated Trig 266 on 05/17/2014.   Patient has known prediabetes since Oct 2014 with A1c 5.8% and patient denies reactive hypoglycemic symptoms, visual blurring, diabetic polys or paresthesias. Last A1c was  Hemoglobin-A1c 5.8% on   05/17/2014.   Patient has hx/o Low T with Testosterone 151 in Oct 2014 and patient has declined replacement therapy .Finally, patient has history of Vitamin D Deficiency of 95 in Oct 2014 and last vitamin D was 66 on  05/17/2014.  Medication Sig  . aspirin 81 MG tablet Take 81 mg by mouth daily.  Marland Kitchen atorvastatin (LIPITOR) 80 MG tablet Take 80 mg by mouth daily. Takes 1 tab on Tues Thur and Sat  . bisoprolol-hydrochlorothiazide (ZIAC) 5-6.25 MG per tablet Take 1 tablet by mouth daily. For BP  . Cholecalciferol (VITAMIN D-3 PO) Take 2,000 Units by mouth 2 (two) times daily.  . Cinnamon 500 MG capsule Take 500 mg by mouth 4 (four) times daily.  . fluticasone (FLONASE) 50 MCG/ACT nasal spray Place 1 spray into both nostrils daily. At bedtime  . Multiple Vitamin (MULTIVITAMIN) tablet Take 1 tablet by mouth daily.  . Omega-3 Fatty Acids (FISH OIL) 1000 MG CAPS Take 1,400 mg by mouth 2 (two) times daily.    No Known Allergies  Past Medical History  Diagnosis Date  . Hypertension   . Hyperlipidemia    . Kidney stones   . Anxiety   . OSA (obstructive sleep apnea)   . Unspecified vitamin D deficiency   . Other testicular hypofunction    Health Maintenance  Topic Date Due  . TETANUS/TDAP  06/23/1978  . INFLUENZA VACCINE  05/14/2014  . COLONOSCOPY  04/13/2020   Immunization History  Administered Date(s) Administered  . Influenza Split 08/23/2014  . Influenza Whole 08/09/2013  . PPD Test 08/23/2014  . Pneumococcal Conjugate-13 08/23/2014   Past Surgical History  Procedure Laterality Date  . Wisdom tooth extraction  1976  . Nasal septum surgery    . Incision and drainage perirectal abscess    . Melanoma excision  2009  . Hernia repair Left groin    1970  . Hernia repair Right groin    2011   Family History  Problem Relation Age of Onset  . Aneurysm Mother   . Kidney disease Father   . Kidney failure Father    History   Social History  . Marital Status: Married    Spouse Name: N/A    Number of Children: N/A  . Years of Education: N/A   Occupational History  .    Social History Main Topics  . Smoking status: Never Smoker   . Smokeless tobacco: Not on file  . Alcohol Use: No  . Drug Use: No  . Sexual Activity: Not on file   ROS Constitutional: Denies fever, chills, weight loss/gain, headaches, insomnia, fatigue, night sweats or  change in appetite. Eyes: Denies redness, blurred vision, diplopia, discharge, itchy or watery eyes.  ENT: Denies discharge, congestion, post nasal drip, epistaxis, sore throat, earache, hearing loss, dental pain, Tinnitus, Vertigo, Sinus pain or snoring.  Cardio: Denies chest pain, palpitations, irregular heartbeat, syncope, dyspnea, diaphoresis, orthopnea, PND, claudication or edema Respiratory: denies cough, dyspnea, DOE, pleurisy, hoarseness, laryngitis or wheezing.  Gastrointestinal: Denies dysphagia, heartburn, reflux, water brash, pain, cramps, nausea, vomiting, bloating, diarrhea, constipation, hematemesis, melena, hematochezia,  jaundice or hemorrhoids Genitourinary: Denies dysuria, frequency, urgency, nocturia, hesitancy, discharge, hematuria or flank pain Musculoskeletal: Denies arthralgia, myalgia, stiffness, Jt. Swelling, pain, limp or strain/sprain. Denies Falls. Skin: Denies puritis, rash, hives, warts, acne, eczema or change in skin lesion Neuro: No weakness, tremor, incoordination, spasms, paresthesia or pain Psychiatric: Denies confusion, memory loss or sensory loss. Denies Depression. Endocrine: Denies change in weight, skin, hair change, nocturia, and paresthesia, diabetic polys, visual blurring or hyper / hypo glycemic episodes.  Heme/Lymph: No excessive bleeding, bruising or enlarged lymph nodes.  Physical Exam  BP 110/64 mmHg  Pulse 72  Temp(Src) 97.6 F (36.4 C)  Resp 16  Ht 6' 1.25" (1.861 m)  Wt 207 lb 9.6 oz (94.167 kg)  BMI 27.19 kg/m2  General Appearance: Well nourished, in no apparent distress. Eyes: PERRLA, EOMs, conjunctiva no swelling or erythema, normal fundi and vessels. Sinuses: No frontal/maxillary tenderness ENT/Mouth: EACs patent / TMs  nl. Nares clear without erythema, swelling, mucoid exudates. Oral hygiene is good. No erythema, swelling, or exudate. Tongue normal, non-obstructing. Tonsils not swollen or erythematous. Hearing normal.  Neck: Supple, thyroid normal. No bruits, nodes or JVD. Respiratory: Respiratory effort normal.  BS equal and clear bilateral without rales, rhonci, wheezing or stridor. Cardio: Heart sounds are normal with regular rate and rhythm and no murmurs, rubs or gallops. Peripheral pulses are normal and equal bilaterally without edema. No aortic or femoral bruits. Chest: symmetric with normal excursions and percussion.  Abdomen: Flat, soft, with bowl sounds. Nontender, no guarding, rebound, hernias, masses, or organomegaly.  Lymphatics: Non tender without lymphadenopathy.  Genitourinary: No hernias.Testes nl. DRE - prostate nl for age - smooth & firm w/o  nodules. Musculoskeletal: Full ROM all peripheral extremities, joint stability, 5/5 strength, and normal gait. Skin: Warm and dry without rashes, lesions, cyanosis, clubbing or  ecchymosis.  Neuro: Cranial nerves intact, reflexes equal bilaterally. Normal muscle tone, no cerebellar symptoms. Sensation intact.  Pysch: Awake and oriented X 3with normal affect, insight and judgment appropriate.   Assessment and Plan  1. Encounter for general adult medical examination with abnormal findings  - Urine Microscopic - EKG 12-Lead - Vitamin B12 - Iron and TIBC - CBC with Differential - BASIC METABOLIC PANEL WITH GFR - Hepatic function panel - Magnesium - TSH  2. Essential hypertension  - Microalbumin / creatinine urine ratio - [place], RETROPERITNL ABD,  LTD  3. Hyperlipidemia  - Lipid panel  4. Prediabetes  - Hemoglobin A1c - Insulin, fasting  5. Vitamin D deficiency  - Vit D  25 hydroxy (rtn osteoporosis monitoring)  6. Testosterone deficiency  - Testosterone  7. OSA on CPAP   8. Screening for rectal cancer  - POC Hemoccult Bld/Stl (3-Cd Home Screen); Future  9. Screening for prostate cancer  - PSA  10. Screening for venereal disease (VD)   11. Abnormal LFTs   12. Screening examination for pulmonary tuberculosis  - TB Skin Test  13. Need for prophylactic vaccination and inoculation against influenza  - Flu vaccine greater than or equal  to 3yo with preservative IM  14. Need for prophylactic vaccination against Streptococcus pneumoniae (pneumococcus)  - Pneumococcal conjugate vaccine 13-valent   Continue prudent diet as discussed, weight control, BP monitoring, regular exercise, and medications as discussed.  Discussed med effects and SE's. Routine screening labs and tests as requested with regular follow-up as recommended.

## 2014-08-23 NOTE — Patient Instructions (Signed)

## 2014-08-24 LAB — URINALYSIS, MICROSCOPIC ONLY
Bacteria, UA: NONE SEEN
Casts: NONE SEEN
Crystals: NONE SEEN
Squamous Epithelial / LPF: NONE SEEN

## 2014-08-24 LAB — BASIC METABOLIC PANEL WITH GFR
BUN: 16 mg/dL (ref 6–23)
CHLORIDE: 100 meq/L (ref 96–112)
CO2: 29 meq/L (ref 19–32)
Calcium: 9.8 mg/dL (ref 8.4–10.5)
Creat: 0.9 mg/dL (ref 0.50–1.35)
GFR, Est Non African American: >89 mL/min
GLUCOSE: 103 mg/dL — AB (ref 70–99)
Potassium: 4.2 mEq/L (ref 3.5–5.3)
SODIUM: 138 meq/L (ref 135–145)

## 2014-08-24 LAB — MAGNESIUM: Magnesium: 1.9 mg/dL (ref 1.5–2.5)

## 2014-08-24 LAB — LIPID PANEL
CHOL/HDL RATIO: 3.6 ratio
CHOLESTEROL: 173 mg/dL (ref 0–200)
HDL: 48 mg/dL (ref >39)
LDL Cholesterol: 66 mg/dL (ref 0–99)
Triglycerides: 293 mg/dL — ABNORMAL HIGH (ref <150)
VLDL: 59 mg/dL — AB (ref 0–40)

## 2014-08-24 LAB — VITAMIN B12: Vitamin B-12: 486 pg/mL (ref 211–911)

## 2014-08-24 LAB — TSH: TSH: 2.268 u[IU]/mL (ref 0.350–4.500)

## 2014-08-24 LAB — MICROALBUMIN / CREATININE URINE RATIO
Creatinine, Urine: 140.3 mg/dL
MICROALB UR: 3.5 mg/dL — AB (ref <2.0)
Microalb Creat Ratio: 24.9 mg/g (ref 0.0–30.0)

## 2014-08-24 LAB — HEPATIC FUNCTION PANEL
ALT: 23 U/L (ref 0–53)
AST: 21 U/L (ref 0–37)
Albumin: 4.6 g/dL (ref 3.5–5.2)
Alkaline Phosphatase: 48 U/L (ref 39–117)
BILIRUBIN DIRECT: 0.1 mg/dL (ref 0.0–0.3)
Indirect Bilirubin: 0.5 mg/dL (ref 0.2–1.2)
Total Bilirubin: 0.6 mg/dL (ref 0.2–1.2)
Total Protein: 7 g/dL (ref 6.0–8.3)

## 2014-08-24 LAB — IRON AND TIBC
%SAT: 26 % (ref 20–55)
Iron: 101 ug/dL (ref 42–165)
TIBC: 389 ug/dL (ref 215–435)
UIBC: 288 ug/dL (ref 125–400)

## 2014-08-24 LAB — TESTOSTERONE: Testosterone: 213 ng/dL — ABNORMAL LOW (ref 300–890)

## 2014-08-24 LAB — PSA: PSA: 1.26 ng/mL (ref <=4.00)

## 2014-08-24 LAB — VITAMIN D 25 HYDROXY (VIT D DEFICIENCY, FRACTURES): Vit D, 25-Hydroxy: 69 ng/mL (ref 30–89)

## 2014-08-24 LAB — INSULIN, FASTING: Insulin fasting, serum: 13.3 u[IU]/mL (ref 2.0–19.6)

## 2014-09-14 ENCOUNTER — Other Ambulatory Visit: Payer: Self-pay | Admitting: *Deleted

## 2014-09-14 DIAGNOSIS — Z1212 Encounter for screening for malignant neoplasm of rectum: Secondary | ICD-10-CM

## 2014-09-14 LAB — POC HEMOCCULT BLD/STL (HOME/3-CARD/SCREEN)
Card #2 Fecal Occult Blod, POC: NEGATIVE
Card #3 Fecal Occult Blood, POC: NEGATIVE
FECAL OCCULT BLD: NEGATIVE

## 2014-10-13 ENCOUNTER — Other Ambulatory Visit: Payer: Self-pay | Admitting: Internal Medicine

## 2014-10-13 DIAGNOSIS — K5732 Diverticulitis of large intestine without perforation or abscess without bleeding: Secondary | ICD-10-CM

## 2014-10-13 MED ORDER — HYOSCYAMINE SULFATE 0.125 MG PO TABS: ORAL_TABLET | ORAL | Status: DC

## 2014-10-13 MED ORDER — METRONIDAZOLE 500 MG PO TABS: 500.0000 mg | ORAL_TABLET | Freq: Three times a day (TID) | ORAL | Status: DC

## 2014-10-13 MED ORDER — CIPROFLOXACIN HCL 500 MG PO TABS: 500.0000 mg | ORAL_TABLET | Freq: Two times a day (BID) | ORAL | Status: DC

## 2014-12-01 ENCOUNTER — Ambulatory Visit: Payer: Self-pay | Admitting: Physician Assistant

## 2014-12-01 ENCOUNTER — Encounter: Payer: Self-pay | Admitting: Internal Medicine

## 2014-12-01 MED ORDER — ATORVASTATIN CALCIUM 80 MG PO TABS: 80.0000 mg | ORAL_TABLET | Freq: Every day | ORAL | Status: DC

## 2014-12-01 MED ORDER — BISOPROLOL-HYDROCHLOROTHIAZIDE 5-6.25 MG PO TABS: 1.0000 | ORAL_TABLET | Freq: Every day | ORAL | Status: DC

## 2014-12-05 ENCOUNTER — Other Ambulatory Visit: Payer: Self-pay | Admitting: *Deleted

## 2014-12-05 ENCOUNTER — Other Ambulatory Visit: Payer: Self-pay | Admitting: Internal Medicine

## 2014-12-05 DIAGNOSIS — E782 Mixed hyperlipidemia: Secondary | ICD-10-CM

## 2014-12-05 MED ORDER — ATORVASTATIN CALCIUM 80 MG PO TABS: ORAL_TABLET | ORAL | Status: DC

## 2014-12-06 ENCOUNTER — Other Ambulatory Visit: Payer: Self-pay | Admitting: *Deleted

## 2014-12-06 DIAGNOSIS — E782 Mixed hyperlipidemia: Secondary | ICD-10-CM

## 2014-12-06 MED ORDER — ATORVASTATIN CALCIUM 80 MG PO TABS: ORAL_TABLET | ORAL | Status: DC

## 2014-12-14 ENCOUNTER — Other Ambulatory Visit: Payer: Self-pay

## 2014-12-14 ENCOUNTER — Ambulatory Visit (INDEPENDENT_AMBULATORY_CARE_PROVIDER_SITE_OTHER): Payer: BLUE CROSS/BLUE SHIELD | Admitting: Physician Assistant

## 2014-12-14 VITALS — BP 130/82 | HR 76 | Temp 97.7°F | Resp 16

## 2014-12-14 DIAGNOSIS — I10 Essential (primary) hypertension: Secondary | ICD-10-CM

## 2014-12-14 DIAGNOSIS — R7309 Other abnormal glucose: Secondary | ICD-10-CM

## 2014-12-14 DIAGNOSIS — E559 Vitamin D deficiency, unspecified: Secondary | ICD-10-CM

## 2014-12-14 DIAGNOSIS — Z79899 Other long term (current) drug therapy: Secondary | ICD-10-CM

## 2014-12-14 DIAGNOSIS — E782 Mixed hyperlipidemia: Secondary | ICD-10-CM

## 2014-12-14 DIAGNOSIS — Z9989 Dependence on other enabling machines and devices: Secondary | ICD-10-CM

## 2014-12-14 DIAGNOSIS — G4733 Obstructive sleep apnea (adult) (pediatric): Secondary | ICD-10-CM

## 2014-12-14 DIAGNOSIS — R7303 Prediabetes: Secondary | ICD-10-CM

## 2014-12-14 LAB — CBC WITH DIFFERENTIAL/PLATELET
Basophils Absolute: 0.1 10*3/uL (ref 0.0–0.1)
Basophils Relative: 1 % (ref 0–1)
EOS PCT: 1 % (ref 0–5)
Eosinophils Absolute: 0.1 10*3/uL (ref 0.0–0.7)
HEMATOCRIT: 40.4 % (ref 39.0–52.0)
Hemoglobin: 13.8 g/dL (ref 13.0–17.0)
LYMPHS PCT: 30 % (ref 12–46)
Lymphs Abs: 2 10*3/uL (ref 0.7–4.0)
MCH: 30.9 pg (ref 26.0–34.0)
MCHC: 34.2 g/dL (ref 30.0–36.0)
MCV: 90.6 fL (ref 78.0–100.0)
MONOS PCT: 11 % (ref 3–12)
MPV: 8.9 fL (ref 8.6–12.4)
Monocytes Absolute: 0.7 10*3/uL (ref 0.1–1.0)
NEUTROS ABS: 3.8 10*3/uL (ref 1.7–7.7)
Neutrophils Relative %: 57 % (ref 43–77)
Platelets: 361 10*3/uL (ref 150–400)
RBC: 4.46 MIL/uL (ref 4.22–5.81)
RDW: 13.7 % (ref 11.5–15.5)
WBC: 6.7 10*3/uL (ref 4.0–10.5)

## 2014-12-14 MED ORDER — ATORVASTATIN CALCIUM 80 MG PO TABS: 80.0000 mg | ORAL_TABLET | Freq: Every day | ORAL | Status: DC

## 2014-12-14 NOTE — Patient Instructions (Addendum)
9 Ways to Naturally Increase Testosterone Levels  1.   Lose Weight If you're overweight, shedding the excess pounds may increase your testosterone levels, according to research presented at the Endocrine Society's 2012 meeting. Overweight men are more likely to have low testosterone levels to begin with, so this is an important trick to increase your body's testosterone production when you need it most.  2.   High-Intensity Exercise like Peak Fitness  Short intense exercise has a proven positive effect on increasing testosterone levels and preventing its decline. That's unlike aerobics or prolonged moderate exercise, which have shown to have negative or no effect on testosterone levels. Having a whey protein meal after exercise can further enhance the satiety/testosterone-boosting impact (hunger hormones cause the opposite effect on your testosterone and libido). Here's a summary of what a typical high-intensity Peak Fitness routine might look like: " Warm up for three minutes  " Exercise as hard and fast as you can for 30 seconds. You should feel like you couldn't possibly go on another few seconds  " Recover at a slow to moderate pace for 90 seconds  " Repeat the high intensity exercise and recovery 7 more times .  3.   Consume Plenty of Zinc The mineral zinc is important for testosterone production, and supplementing your diet for as little as six weeks has been shown to cause a marked improvement in testosterone among men with low levels.1 Likewise, research has shown that restricting dietary sources of zinc leads to a significant decrease in testosterone, while zinc supplementation increases it2 -- and even protects men from exercised-induced reductions in testosterone levels.3 It's estimated that up to 45 percent of adults over the age of 60 may have lower than recommended zinc intakes; even when dietary supplements were added in, an estimated 20-25 percent of older adults still had inadequate  zinc intakes, according to a National Health and Nutrition Examination Survey.4 Your diet is the best source of zinc; along with protein-rich foods like meats and fish, other good dietary sources of zinc include raw milk, raw cheese, beans, and yogurt or kefir made from raw milk. It can be difficult to obtain enough dietary zinc if you're a vegetarian, and also for meat-eaters as well, largely because of conventional farming methods that rely heavily on chemical fertilizers and pesticides. These chemicals deplete the soil of nutrients ... nutrients like zinc that must be absorbed by plants in order to be passed on to you. In many cases, you may further deplete the nutrients in your food by the way you prepare it. For most food, cooking it will drastically reduce its levels of nutrients like zinc . particularly over-cooking, which many people do. If you decide to use a zinc supplement, stick to a dosage of less than 40 mg a day, as this is the recommended adult upper limit. Taking too much zinc can interfere with your body's ability to absorb other minerals, especially copper, and may cause nausea as a side effect.  4.   Strength Training In addition to Peak Fitness, strength training is also known to boost testosterone levels, provided you are doing so intensely enough. When strength training to boost testosterone, you'll want to increase the weight and lower your number of reps, and then focus on exercises that work a large number of muscles, such as dead lifts or squats.  You can "turbo-charge" your weight training by going slower. By slowing down your movement, you're actually turning it into a high-intensity exercise. Super Slow   movement allows your muscle, at the microscopic level, to access the maximum number of cross-bridges between the protein filaments that produce movement in the muscle.   5.   Optimize Your Vitamin D Levels Vitamin D, a steroid hormone, is essential for the healthy development  of the nucleus of the sperm cell, and helps maintain semen quality and sperm count. Vitamin D also increases levels of testosterone, which may boost libido. In one study, overweight men who were given vitamin D supplements had a significant increase in testosterone levels after one year.5   6.   Reduce Stress When you're under a lot of stress, your body releases high levels of the stress hormone cortisol. This hormone actually blocks the effects of testosterone,6 presumably because, from a biological standpoint, testosterone-associated behaviors (mating, competing, aggression) may have lowered your chances of survival in an emergency (hence, the "fight or flight" response is dominant, courtesy of cortisol).  7.   Limit or Eliminate Sugar from Your Diet Testosterone levels decrease after you eat sugar, which is likely because the sugar leads to a high insulin level, another factor leading to low testosterone.7 Based on USDA estimates, the average American consumes 12 teaspoons of sugar a day, which equates to about TWO TONS of sugar during a lifetime.  8.   Eat Healthy Fats By healthy, this means not only mon- and polyunsaturated fats, like that found in avocadoes and nuts, but also saturated, as these are essential for building testosterone. Research shows that a diet with less than 40 percent of energy as fat (and that mainly from animal sources, i.e. saturated) lead to a decrease in testosterone levels.8 My personal diet is about 60-70 percent healthy fat, and other experts agree that the ideal diet includes somewhere between 50-70 percent fat.  It's important to understand that your body requires saturated fats from animal and vegetable sources (such as meat, dairy, certain oils, and tropical plants like coconut) for optimal functioning, and if you neglect this important food group in favor of sugar, grains and other starchy carbs, your health and weight are almost guaranteed to suffer. Examples of  healthy fats you can eat more of to give your testosterone levels a boost include: Olives and Olive oil  Coconuts and coconut oil Butter made from raw grass-fed organic milk Raw nuts, such as, almonds or pecans Organic pastured egg yolks Avocados Grass-fed meats Palm oil Unheated organic nut oils   9.   Boost Your Intake of Branch Chain Amino Acids (BCAA) from Foods Like [place] suggests that BCAAs result in higher testosterone levels, particularly when taken along with resistance training.9 While BCAAs are available in supplement form, you'll find the highest concentrations of BCAAs like leucine in dairy products - especially quality cheeses and whey protein. Even when getting leucine from your natural food supply, it's often wasted or used as a building block instead of an anabolic agent. So to create the correct anabolic environment, you need to boost leucine consumption way beyond mere maintenance levels. That said, keep in mind that using leucine as a free form amino acid can be highly counterproductive as when free form amino acids are artificially administrated, they rapidly enter your circulation while disrupting insulin function, and impairing your body's glycemic control. Food-based leucine is really the ideal form that can benefit your muscles without side effects.    Benefiber is good for constipation/diarrhea/irritable bowel syndrome, it helps with weight loss and can help lower your bad cholesterol. Please do 1-2 TBSP  in the morning in water, coffee, or tea. It can take up to a month before you can see a difference with your bowel movements. It is cheapest from [employer], [employer], [employer].

## 2014-12-14 NOTE — Progress Notes (Signed)
Assessment and Plan:  Hypertension: Continue medication, monitor blood pressure at home. Continue DASH diet.  Reminder to go to the ER if any CP, SOB, nausea, dizziness, severe HA, changes vision/speech, left arm numbness and tingling, and jaw pain. Cholesterol: Continue diet and exercise. Check cholesterol.  Pre-diabetes-Continue diet and exercise. Check A1C Vitamin D Def- check level and continue medications.  Hypogonadism- given natural ways to increase testosterone  Continue diet and meds as discussed. Further disposition pending results of labs.  HPI 54 y.o. male  presents for 3 month follow up with hypertension, hyperlipidemia, prediabetes and vitamin D.  His blood pressure has been controlled at home, today their BP is BP: 130/82 mmHg  He does not workout, trying to get back into it with the warm weather He denies chest pain, shortness of breath, dizziness. He is on CPAP.   He is on cholesterol medication, lipitor 80 take 3 a week and denies myalgias. His cholesterol is at goal. The cholesterol last visit was:   Lab Results  Component Value Date   CHOL 173 08/23/2014   HDL 48 08/23/2014   LDLCALC 66 08/23/2014   TRIG 293* 08/23/2014   CHOLHDL 3.6 08/23/2014    He has been working on diet and exercise for prediabetes, and denies paresthesia of the feet, polydipsia, polyuria and visual disturbances. Last A1C in the office was:  Lab Results  Component Value Date   HGBA1C 5.7* 08/23/2014  Patient is on Vitamin D supplement.   Lab Results  Component Value Date   VD25OH 69 08/23/2014   Does have a history of diverticulitis, had some LLQ pain in Dec, call Dr. Melford Aase and had cipro/flagyl called in and he states he is feeling better without complications.    Current Medications:  Current Outpatient Prescriptions on File Prior to Visit  Medication Sig Dispense Refill  . aspirin 81 MG tablet Take 81 mg by mouth daily.    . bisoprolol-hydrochlorothiazide (ZIAC) 5-6.25 MG per tablet  Take 1 tablet by mouth daily. For BP 90 tablet 1  . Cholecalciferol (VITAMIN D-3 PO) Take 4,000 Units by mouth 2 (two) times daily.     . Cinnamon 500 MG capsule Take 500 mg by mouth 4 (four) times daily.    . fluticasone (FLONASE) 50 MCG/ACT nasal spray Place 1 spray into both nostrils daily. At bedtime    . Multiple Vitamin (MULTIVITAMIN) tablet Take 1 tablet by mouth daily.    . Omega-3 Fatty Acids (FISH OIL PO) Take 1,200 mg by mouth 2 (two) times daily.      No current facility-administered medications on file prior to visit.   Medical History:  Past Medical History  Diagnosis Date  . Hypertension   . Hyperlipidemia   . Kidney stones   . Anxiety   . OSA (obstructive sleep apnea)   . Unspecified vitamin D deficiency   . Other testicular hypofunction    Allergies: No Known Allergies   Review of Systems:  Review of Systems  Constitutional: Negative.   HENT: Negative.   Eyes: Negative.   Respiratory: Negative.   Cardiovascular: Negative.   Gastrointestinal: Negative.   Genitourinary: Negative.   Musculoskeletal: Negative.   Skin: Negative.   Neurological: Negative.   Endo/Heme/Allergies: Negative.   Psychiatric/Behavioral: Negative.     Family history- Review and unchanged Social history- Review and unchanged Physical Exam: BP 130/82 mmHg  Pulse 76  Temp(Src) 97.7 F (36.5 C)  Resp 16 Wt Readings from Last 3 Encounters:  08/23/14 207 lb  9.6 oz (94.167 kg)  05/17/14 206 lb 3.2 oz (93.532 kg)  11/09/13 207 lb 12.8 oz (94.257 kg)   General Appearance: Well nourished, in no apparent distress. Eyes: PERRLA, EOMs, conjunctiva no swelling or erythema Sinuses: No Frontal/maxillary tenderness ENT/Mouth: Ext aud canals clear, TMs without erythema, bulging. No erythema, swelling, or exudate on post pharynx.  Tonsils not swollen or erythematous. Hearing normal.  Neck: Supple, thyroid normal.  Respiratory: Respiratory effort normal, BS equal bilaterally without rales,  rhonchi, wheezing or stridor.  Cardio: RRR with no MRGs. Brisk peripheral pulses without edema.  Abdomen: Soft, + BS,  Non tender, no guarding, rebound, hernias, masses. Lymphatics: Non tender without lymphadenopathy.  Musculoskeletal: Full ROM, 5/5 strength, Normal gait Skin: Warm, dry without rashes, lesions, ecchymosis.  Neuro: Cranial nerves intact. Normal muscle tone, no cerebellar symptoms. Psych: Awake and oriented X 3, normal affect, Insight and Judgment appropriate.    Vicie Mutters, PA-C 4:12 PM [facility]

## 2014-12-15 LAB — LIPID PANEL
CHOL/HDL RATIO: 4.1 ratio
Cholesterol: 195 mg/dL (ref 0–200)
HDL: 47 mg/dL (ref >=40)
LDL CALC: 107 mg/dL — AB (ref 0–99)
Triglycerides: 205 mg/dL — ABNORMAL HIGH (ref <150)
VLDL: 41 mg/dL — AB (ref 0–40)

## 2014-12-15 LAB — BASIC METABOLIC PANEL WITH GFR
BUN: 25 mg/dL — ABNORMAL HIGH (ref 6–23)
CO2: 29 mEq/L (ref 19–32)
Calcium: 10.2 mg/dL (ref 8.4–10.5)
Chloride: 102 mEq/L (ref 96–112)
Creat: 0.98 mg/dL (ref 0.50–1.35)
GFR, EST NON AFRICAN AMERICAN: 86 mL/min
Glucose, Bld: 91 mg/dL (ref 70–99)
POTASSIUM: 4.5 meq/L (ref 3.5–5.3)
SODIUM: 141 meq/L (ref 135–145)

## 2014-12-15 LAB — HEPATIC FUNCTION PANEL
ALT: 25 U/L (ref 0–53)
AST: 20 U/L (ref 0–37)
Albumin: 4.8 g/dL (ref 3.5–5.2)
Alkaline Phosphatase: 50 U/L (ref 39–117)
BILIRUBIN INDIRECT: 0.4 mg/dL (ref 0.2–1.2)
Bilirubin, Direct: 0.1 mg/dL (ref 0.0–0.3)
Total Bilirubin: 0.5 mg/dL (ref 0.2–1.2)
Total Protein: 7.3 g/dL (ref 6.0–8.3)

## 2014-12-15 LAB — HEMOGLOBIN A1C
Hgb A1c MFr Bld: 5.8 % — ABNORMAL HIGH (ref <5.7)
Mean Plasma Glucose: 120 mg/dL — ABNORMAL HIGH (ref <117)

## 2014-12-15 LAB — VITAMIN D 25 HYDROXY (VIT D DEFICIENCY, FRACTURES): Vit D, 25-Hydroxy: 61 ng/mL (ref 30–100)

## 2014-12-15 LAB — MAGNESIUM: MAGNESIUM: 2.2 mg/dL (ref 1.5–2.5)

## 2014-12-15 LAB — TSH: TSH: 2.267 u[IU]/mL (ref 0.350–4.500)

## 2014-12-15 LAB — INSULIN, FASTING: INSULIN FASTING, SERUM: 12.7 u[IU]/mL (ref 2.0–19.6)

## 2015-03-16 ENCOUNTER — Ambulatory Visit (INDEPENDENT_AMBULATORY_CARE_PROVIDER_SITE_OTHER): Payer: BLUE CROSS/BLUE SHIELD | Admitting: Internal Medicine

## 2015-03-16 ENCOUNTER — Other Ambulatory Visit: Payer: Self-pay | Admitting: *Deleted

## 2015-03-16 ENCOUNTER — Encounter: Payer: Self-pay | Admitting: Internal Medicine

## 2015-03-16 VITALS — BP 120/76 | HR 60 | Temp 97.5°F | Resp 16 | Ht 73.25 in | Wt 207.2 lb

## 2015-03-16 DIAGNOSIS — J309 Allergic rhinitis, unspecified: Secondary | ICD-10-CM

## 2015-03-16 DIAGNOSIS — E782 Mixed hyperlipidemia: Secondary | ICD-10-CM

## 2015-03-16 DIAGNOSIS — E291 Testicular hypofunction: Secondary | ICD-10-CM

## 2015-03-16 DIAGNOSIS — L739 Follicular disorder, unspecified: Secondary | ICD-10-CM

## 2015-03-16 DIAGNOSIS — E559 Vitamin D deficiency, unspecified: Secondary | ICD-10-CM

## 2015-03-16 DIAGNOSIS — E349 Endocrine disorder, unspecified: Secondary | ICD-10-CM

## 2015-03-16 DIAGNOSIS — Z9989 Dependence on other enabling machines and devices: Secondary | ICD-10-CM

## 2015-03-16 DIAGNOSIS — R7309 Other abnormal glucose: Secondary | ICD-10-CM

## 2015-03-16 DIAGNOSIS — G4733 Obstructive sleep apnea (adult) (pediatric): Secondary | ICD-10-CM

## 2015-03-16 DIAGNOSIS — R7303 Prediabetes: Secondary | ICD-10-CM

## 2015-03-16 DIAGNOSIS — I10 Essential (primary) hypertension: Secondary | ICD-10-CM

## 2015-03-16 DIAGNOSIS — Z79899 Other long term (current) drug therapy: Secondary | ICD-10-CM

## 2015-03-16 LAB — CBC WITH DIFFERENTIAL/PLATELET
Basophils Absolute: 0.1 10*3/uL (ref 0.0–0.1)
Basophils Relative: 1 % (ref 0–1)
EOS ABS: 0.1 10*3/uL (ref 0.0–0.7)
Eosinophils Relative: 1 % (ref 0–5)
HEMATOCRIT: 42.8 % (ref 39.0–52.0)
Hemoglobin: 14.7 g/dL (ref 13.0–17.0)
LYMPHS ABS: 1.5 10*3/uL (ref 0.7–4.0)
Lymphocytes Relative: 24 % (ref 12–46)
MCH: 30.4 pg (ref 26.0–34.0)
MCHC: 34.3 g/dL (ref 30.0–36.0)
MCV: 88.4 fL (ref 78.0–100.0)
MONO ABS: 0.5 10*3/uL (ref 0.1–1.0)
MPV: 9.2 fL (ref 8.6–12.4)
Monocytes Relative: 8 % (ref 3–12)
Neutro Abs: 4.2 10*3/uL (ref 1.7–7.7)
Neutrophils Relative %: 66 % (ref 43–77)
Platelets: 312 10*3/uL (ref 150–400)
RBC: 4.84 MIL/uL (ref 4.22–5.81)
RDW: 12.7 % (ref 11.5–15.5)
WBC: 6.4 10*3/uL (ref 4.0–10.5)

## 2015-03-16 LAB — MAGNESIUM: Magnesium: 1.8 mg/dL (ref 1.5–2.5)

## 2015-03-16 LAB — BASIC METABOLIC PANEL WITH GFR
BUN: 20 mg/dL (ref 6–23)
CO2: 29 mEq/L (ref 19–32)
CREATININE: 0.99 mg/dL (ref 0.50–1.35)
Calcium: 9.2 mg/dL (ref 8.4–10.5)
Chloride: 102 mEq/L (ref 96–112)
GFR, Est African American: >89 mL/min
GFR, Est Non African American: 85 mL/min
Glucose, Bld: 93 mg/dL (ref 70–99)
Potassium: 4.6 mEq/L (ref 3.5–5.3)
SODIUM: 140 meq/L (ref 135–145)

## 2015-03-16 LAB — HEPATIC FUNCTION PANEL
ALT: 30 U/L (ref 0–53)
AST: 22 U/L (ref 0–37)
Albumin: 4.2 g/dL (ref 3.5–5.2)
Alkaline Phosphatase: 45 U/L (ref 39–117)
BILIRUBIN DIRECT: 0.1 mg/dL (ref 0.0–0.3)
BILIRUBIN TOTAL: 0.6 mg/dL (ref 0.2–1.2)
Indirect Bilirubin: 0.5 mg/dL (ref 0.2–1.2)
Total Protein: 6.5 g/dL (ref 6.0–8.3)

## 2015-03-16 LAB — LIPID PANEL
Cholesterol: 149 mg/dL (ref 0–200)
HDL: 47 mg/dL (ref >=40)
LDL CALC: 72 mg/dL (ref 0–99)
Total CHOL/HDL Ratio: 3.2 Ratio
Triglycerides: 152 mg/dL — ABNORMAL HIGH (ref <150)
VLDL: 30 mg/dL (ref 0–40)

## 2015-03-16 MED ORDER — MOMETASONE FUROATE 50 MCG/ACT NA SUSP: NASAL | Status: DC

## 2015-03-16 MED ORDER — DOXYCYCLINE HYCLATE 100 MG PO CAPS: ORAL_CAPSULE | ORAL | Status: DC

## 2015-03-16 NOTE — Patient Instructions (Signed)

## 2015-03-16 NOTE — Progress Notes (Signed)
Patient ID: Tony Bailey, male   DOB: [birth year], 54 y.o.   MRN: [id number]   This very nice 54 y.o. MWM presents for 3 month follow up with Hypertension, Hyperlipidemia, Pre-Diabetes and Vitamin D Deficiency.    Patient is treated for HTN & BP has been controlled at home. Today's BP: 120/76 mmHg. Patient has had no complaints of any cardiac type chest pain, palpitations, dyspnea/orthopnea/PND, dizziness, claudication, or dependent edema.   Hyperlipidemia is controlled with diet & meds. Patient denies myalgias or other med SE's. Last Lipids were  Total Chol 195; HDL 47; LDL 107; Trig 205 on 12/14/2014.   Also, the patient has history of PreDiabetes and has had no symptoms of reactive hypoglycemia, diabetic polys, paresthesias or visual blurring.  Last A1c was  5.8% on 12/14/2014.   Further, the patient also has history of Vitamin D Deficiency and supplements vitamin D without any suspected side-effects. Last vitamin D was  61 on 12/14/2014.     Medication Sig  . aspirin 81 MG tablet Take 81 mg by mouth daily.  Marland Kitchen atorvastatin  80 MG tablet Take 1 tablet (80 mg total) by mouth daily. Take 1 tab or as directed for cholesterol  . bisoprolol-hctz [facility]) 5-6.25  Take 1 tablet by mouth daily. For BP  . VITAMIN D-3 Take 4,000 Units by mouth 2 (two) times daily.   . Cinnamon 500 MG capsule Take 500 mg by mouth 4 (four) times daily.  . Multiple Vitamin  Take 1 tablet by mouth daily.  Marland Kitchen FISH OIL Take 1,200 mg by mouth 2 (two) times daily.    No Known Allergies  PMHx:   Past Medical History  Diagnosis Date  . Hypertension   . Hyperlipidemia   . Kidney stones   . Anxiety   . OSA (obstructive sleep apnea)   . Unspecified vitamin D deficiency   . Other testicular hypofunction    Immunization History  Administered Date(s) Administered  . Influenza Split 08/23/2014  . Influenza Whole 08/09/2013  . PPD Test 08/23/2014  . Pneumococcal Conjugate-13 08/23/2014   Past Surgical History  Procedure  Laterality Date  . Wisdom tooth extraction  1976  . Nasal septum surgery    . Incision and drainage perirectal abscess    . Melanoma excision  2009  . Hernia repair Left groin    1970  . Hernia repair Right groin    2011   FHx:    Reviewed / unchanged  SHx:    Reviewed / unchanged  Systems Review:  Constitutional: Denies fever, chills, wt changes, headaches, insomnia, fatigue, night sweats, change in appetite. Eyes: Denies redness, blurred vision, diplopia, discharge, itchy, watery eyes.  ENT: Denies discharge, congestion, post nasal drip, epistaxis, sore throat, earache, hearing loss, dental pain, tinnitus, vertigo, sinus pain, snoring.  CV: Denies chest pain, palpitations, irregular heartbeat, syncope, dyspnea, diaphoresis, orthopnea, PND, claudication or edema. Respiratory: denies cough, dyspnea, DOE, pleurisy, hoarseness, laryngitis, wheezing.  Gastrointestinal: Denies dysphagia, odynophagia, heartburn, reflux, water brash, abdominal pain or cramps, nausea, vomiting, bloating, diarrhea, constipation, hematemesis, melena, hematochezia  or hemorrhoids. Genitourinary: Denies dysuria, frequency, urgency, nocturia, hesitancy, discharge, hematuria or flank pain. Musculoskeletal: Denies arthralgias, myalgias, stiffness, jt. swelling, pain, limping or strain/sprain.  Skin: Denies pruritus, rash, hives, warts, acne, eczema or change in skin lesion(s). Neuro: No weakness, tremor, incoordination, spasms, paresthesia or pain. Psychiatric: Denies confusion, memory loss or sensory loss. Endo: Denies change in weight, skin or hair change.  Heme/Lymph: No excessive bleeding, bruising or enlarged  lymph nodes.  Physical Exam  BP 120/76   Pulse 60  Temp 97.5 F   Resp 16  Ht 6' 1.25"   Wt 207 lb 3.2 oz     BMI 27.14  Appears well nourished and in no distress. Eyes: PERRLA, EOMs, conjunctiva no swelling or erythema. Sinuses: No frontal/maxillary tenderness ENT/Mouth: EAC's clear, TM's nl  w/o erythema, bulging. Nares clear w/o erythema, swelling, exudates. Oropharynx clear without erythema or exudates. Oral hygiene is good. Tongue normal, non obstructing. Hearing intact.  Neck: Supple. Thyroid nl. Car 2+/2+ without bruits, nodes or JVD. Chest: Respirations nl with BS clear & equal w/o rales, rhonchi, wheezing or stridor.  Cor: Heart sounds normal w/ regular rate and rhythm without sig. murmurs, gallops, clicks, or rubs. Peripheral pulses normal and equal  without edema.  Abdomen: Soft & bowel sounds normal. Non-tender w/o guarding, rebound, hernias, masses, or organomegaly.  Lymphatics: Unremarkable.  Musculoskeletal: Full ROM all peripheral extremities, joint stability, 5/5 strength, and normal gait.  Skin: Warm, dry without  lesions or ecchymosis apparent. There are a few scattered  areas of peri-folliculitis with a few tiny pustules Neuro: Cranial nerves intact, reflexes equal bilaterally. Sensory-motor testing grossly intact. Tendon reflexes grossly intact.  Pysch: Alert & oriented x 3.  Insight and judgement nl & appropriate. No ideations.  Assessment and Plan:  1. Essential hypertension  - TSH  2. Hyperlipidemia  - Lipid panel  3. Prediabetes  - Hemoglobin A1c - Insulin, random  4. Vitamin D deficiency  - Vit D  25 hydroxy (rtn osteoporosis monitoring)  5. Testosterone deficiency   6. OSA on CPAP   7. Medication management  - CBC with Differential/Platelet - BASIC METABOLIC PANEL WITH GFR - Hepatic function panel - Magnesium  8. Folliculitis   - Rx doxycycline 100 mg #30  X 2 rf   Recommended regular exercise, BP monitoring, weight control, and discussed med and SE's. Recommended labs to assess and monitor clinical status. Further disposition pending results of labs. Over 30 minutes of exam, counseling, chart review was performed

## 2015-03-17 LAB — HEMOGLOBIN A1C
Hgb A1c MFr Bld: 6.1 % — ABNORMAL HIGH (ref <5.7)
Mean Plasma Glucose: 128 mg/dL — ABNORMAL HIGH (ref <117)

## 2015-03-17 LAB — VITAMIN D 25 HYDROXY (VIT D DEFICIENCY, FRACTURES): Vit D, 25-Hydroxy: 56 ng/mL (ref 30–100)

## 2015-03-17 LAB — TSH: TSH: 1.926 u[IU]/mL (ref 0.350–4.500)

## 2015-03-17 LAB — INSULIN, RANDOM: Insulin: 32.6 u[IU]/mL — ABNORMAL HIGH (ref 2.0–19.6)

## 2015-03-20 ENCOUNTER — Other Ambulatory Visit: Payer: Self-pay | Admitting: Internal Medicine

## 2015-03-20 DIAGNOSIS — J309 Allergic rhinitis, unspecified: Secondary | ICD-10-CM

## 2015-03-20 MED ORDER — FLUTICASONE PROPIONATE 50 MCG/ACT NA SUSP: NASAL | Status: DC

## 2015-06-21 ENCOUNTER — Ambulatory Visit (INDEPENDENT_AMBULATORY_CARE_PROVIDER_SITE_OTHER): Payer: BLUE CROSS/BLUE SHIELD | Admitting: Internal Medicine

## 2015-06-21 ENCOUNTER — Encounter: Payer: Self-pay | Admitting: Internal Medicine

## 2015-06-21 VITALS — BP 138/80 | HR 72 | Temp 98.4°F | Resp 16 | Ht 73.25 in | Wt 211.0 lb

## 2015-06-21 DIAGNOSIS — R7303 Prediabetes: Secondary | ICD-10-CM

## 2015-06-21 DIAGNOSIS — E559 Vitamin D deficiency, unspecified: Secondary | ICD-10-CM

## 2015-06-21 DIAGNOSIS — Z79899 Other long term (current) drug therapy: Secondary | ICD-10-CM

## 2015-06-21 DIAGNOSIS — E782 Mixed hyperlipidemia: Secondary | ICD-10-CM | POA: Diagnosis not present

## 2015-06-21 DIAGNOSIS — I10 Essential (primary) hypertension: Secondary | ICD-10-CM | POA: Diagnosis not present

## 2015-06-21 DIAGNOSIS — R7309 Other abnormal glucose: Secondary | ICD-10-CM | POA: Diagnosis not present

## 2015-06-21 LAB — CBC WITH DIFFERENTIAL/PLATELET
Basophils Absolute: 0 10*3/uL (ref 0.0–0.1)
Basophils Relative: 0 % (ref 0–1)
Eosinophils Absolute: 0.1 10*3/uL (ref 0.0–0.7)
Eosinophils Relative: 1 % (ref 0–5)
HEMATOCRIT: 42.2 % (ref 39.0–52.0)
HEMOGLOBIN: 14.7 g/dL (ref 13.0–17.0)
LYMPHS ABS: 2.2 10*3/uL (ref 0.7–4.0)
Lymphocytes Relative: 28 % (ref 12–46)
MCH: 31.2 pg (ref 26.0–34.0)
MCHC: 34.8 g/dL (ref 30.0–36.0)
MCV: 89.6 fL (ref 78.0–100.0)
MONOS PCT: 10 % (ref 3–12)
MPV: 9 fL (ref 8.6–12.4)
Monocytes Absolute: 0.8 10*3/uL (ref 0.1–1.0)
NEUTROS ABS: 4.7 10*3/uL (ref 1.7–7.7)
Neutrophils Relative %: 61 % (ref 43–77)
Platelets: 306 10*3/uL (ref 150–400)
RBC: 4.71 MIL/uL (ref 4.22–5.81)
RDW: 12.9 % (ref 11.5–15.5)
WBC: 7.7 10*3/uL (ref 4.0–10.5)

## 2015-06-21 LAB — HEPATIC FUNCTION PANEL
ALT: 34 U/L (ref 9–46)
AST: 24 U/L (ref 10–35)
Albumin: 4.7 g/dL (ref 3.6–5.1)
Alkaline Phosphatase: 50 U/L (ref 40–115)
BILIRUBIN DIRECT: 0.1 mg/dL (ref <=0.2)
BILIRUBIN TOTAL: 0.7 mg/dL (ref 0.2–1.2)
Indirect Bilirubin: 0.6 mg/dL (ref 0.2–1.2)
Total Protein: 7 g/dL (ref 6.1–8.1)

## 2015-06-21 LAB — BASIC METABOLIC PANEL WITH GFR
BUN: 17 mg/dL (ref 7–25)
CHLORIDE: 101 mmol/L (ref 98–110)
CO2: 30 mmol/L (ref 20–31)
CREATININE: 0.84 mg/dL (ref 0.70–1.33)
Calcium: 9.8 mg/dL (ref 8.6–10.3)
GFR, Est African American: >89 mL/min (ref >=60)
GFR, Est Non African American: >89 mL/min (ref >=60)
Glucose, Bld: 89 mg/dL (ref 65–99)
Potassium: 4.1 mmol/L (ref 3.5–5.3)
Sodium: 139 mmol/L (ref 135–146)

## 2015-06-21 LAB — LIPID PANEL
CHOL/HDL RATIO: 3.4 ratio (ref <=5.0)
CHOLESTEROL: 151 mg/dL (ref 125–200)
HDL: 44 mg/dL (ref >=40)
LDL Cholesterol: 76 mg/dL (ref <130)
TRIGLYCERIDES: 153 mg/dL — AB (ref <150)
VLDL: 31 mg/dL — ABNORMAL HIGH (ref <30)

## 2015-06-21 LAB — TSH: TSH: 1.995 u[IU]/mL (ref 0.350–4.500)

## 2015-06-21 LAB — MAGNESIUM: MAGNESIUM: 2.1 mg/dL (ref 1.5–2.5)

## 2015-06-21 MED ORDER — MUPIROCIN CALCIUM 2 % EX CREA: TOPICAL_CREAM | CUTANEOUS | Status: DC

## 2015-06-21 NOTE — Patient Instructions (Signed)
Please ask the dermatologist about either using topical clindamycin gel or bactroban to help with red bumps on the skin instead of taking doxycycline.

## 2015-06-21 NOTE — Progress Notes (Signed)
Patient ID: Tony Bailey, male   DOB: 1961-04-21, 54 y.o.   MRN: [id number]  Assessment and Plan:  Hypertension:  -Continue medication,  -monitor blood pressure at home.  -Continue DASH diet.   -Reminder to go to the ER if any CP, SOB, nausea, dizziness, severe HA, changes vision/speech, left arm numbness and tingling, and jaw pain.  Cholesterol: -Continue diet and exercise.  -Check cholesterol.   Pre-diabetes: -Continue diet and exercise.  -Check A1C  Vitamin D Def: -check level -continue medications.   Continue diet and meds as discussed. Further disposition pending results of labs.  HPI 54 y.o. male  presents for 3 month follow up with hypertension, hyperlipidemia, prediabetes and vitamin D.   His blood pressure has been controlled at home, today their BP is BP: 138/80 mmHg.   He does workout. He denies chest pain, shortness of breath, dizziness.  He does a lot of walking with his dog.     He is on cholesterol medication and denies myalgias. His cholesterol is at goal. The cholesterol last visit was:   Lab Results  Component Value Date   CHOL 149 03/16/2015   HDL 47 03/16/2015   LDLCALC 72 03/16/2015   TRIG 152* 03/16/2015   CHOLHDL 3.2 03/16/2015     He has been working on diet and exercise for prediabetes, and denies foot ulcerations, hyperglycemia, hypoglycemia , increased appetite, nausea, paresthesia of the feet, polydipsia, polyuria, visual disturbances, vomiting and weight loss. Last A1C in the office was:  Lab Results  Component Value Date   HGBA1C 6.1* 03/16/2015    Patient is on Vitamin D supplement.  Lab Results  Component Value Date   VD25OH 56 03/16/2015      Patient reports that he has been struggling with folliculitis and has been taking doxycycline intermittently to help with the bumps.  He reports that a similar thing happened last year over the summer.  It is generally intermittent in nature.    Current Medications:  Current Outpatient  Prescriptions on File Prior to Visit  Medication Sig Dispense Refill  . aspirin 81 MG tablet Take 81 mg by mouth daily.    Marland Kitchen atorvastatin (LIPITOR) 80 MG tablet Take 1 tablet (80 mg total) by mouth daily. Take 1 tab or as directed for cholesterol 90 tablet 4  . bisoprolol-hydrochlorothiazide (ZIAC) 5-6.25 MG per tablet Take 1 tablet by mouth daily. For BP 90 tablet 1  . Cholecalciferol (VITAMIN D-3 PO) Take 4,000 Units by mouth 2 (two) times daily.     . Cinnamon 500 MG capsule Take 500 mg by mouth 4 (four) times daily.    . fluticasone (FLONASE) 50 MCG/ACT nasal spray 2 sprays each nostril daily 48 g 99  . Multiple Vitamin (MULTIVITAMIN) tablet Take 1 tablet by mouth daily.    . Omega-3 Fatty Acids (FISH OIL PO) Take 1,200 mg by mouth 2 (two) times daily.      No current facility-administered medications on file prior to visit.    Medical History:  Past Medical History  Diagnosis Date  . Hypertension   . Hyperlipidemia   . Kidney stones   . Anxiety   . OSA (obstructive sleep apnea)   . Unspecified vitamin D deficiency   . Other testicular hypofunction     Allergies: No Known Allergies   Review of Systems:  Review of Systems  Constitutional: Negative for fever, chills and malaise/fatigue.  HENT: Negative for congestion, ear pain and sore throat.   Respiratory: Negative for  cough, shortness of breath and wheezing.   Cardiovascular: Negative for chest pain, palpitations and leg swelling.  Gastrointestinal: Negative for diarrhea, constipation, blood in stool and melena.  Genitourinary: Negative.   Musculoskeletal: Positive for joint pain.  Skin: Positive for rash.  Neurological: Negative for dizziness, sensory change, loss of consciousness and headaches.  Psychiatric/Behavioral: Negative for depression. The patient is not nervous/anxious and does not have insomnia.     Family history- Review and unchanged  Social history- Review and unchanged  Physical Exam: BP 138/80 mmHg   Pulse 72  Temp(Src) 98.4 F (36.9 C) (Temporal)  Resp 16  Ht 6' 1.25" (1.861 m)  Wt 211 lb (95.709 kg)  BMI 27.64 kg/m2 Wt Readings from Last 3 Encounters:  06/21/15 211 lb (95.709 kg)  03/16/15 207 lb 3.2 oz (93.985 kg)  08/23/14 207 lb 9.6 oz (94.167 kg)    General Appearance: Well nourished well developed, in no apparent distress. Eyes: PERRLA, EOMs, conjunctiva no swelling or erythema ENT/Mouth: Ear canals normal without obstruction, swelling, erythma, discharge.  TMs normal bilaterally.  Oropharynx moist, clear, without exudate, or postoropharyngeal swelling. Neck: Supple, thyroid normal,no cervical adenopathy  Respiratory: Respiratory effort normal, Breath sounds clear A&P without rhonchi, wheeze, or rale.  No retractions, no accessory usage. Cardio: RRR with no MRGs. Brisk peripheral pulses without edema.  Abdomen: Soft, + BS,  Non tender, no guarding, rebound, hernias, masses. Musculoskeletal: Full ROM, 5/5 strength, Normal gait Skin: Warm, dry without rashes, lesions, ecchymosis.  Neuro: Awake and oriented X 3, Cranial nerves intact. Normal muscle tone, no cerebellar symptoms. Psych: Normal affect, Insight and Judgment appropriate.    Starlyn Skeans, PA-C 4:38 PM [facility]

## 2015-06-22 LAB — HEMOGLOBIN A1C
HEMOGLOBIN A1C: 6 % — AB (ref <5.7)
Mean Plasma Glucose: 126 mg/dL — ABNORMAL HIGH (ref <117)

## 2015-06-22 LAB — INSULIN, RANDOM: Insulin: 6.6 u[IU]/mL (ref 2.0–19.6)

## 2015-06-22 LAB — VITAMIN D 25 HYDROXY (VIT D DEFICIENCY, FRACTURES): Vit D, 25-Hydroxy: 71 ng/mL (ref 30–100)

## 2015-09-05 ENCOUNTER — Encounter: Payer: Self-pay | Admitting: Internal Medicine

## 2015-10-18 ENCOUNTER — Encounter: Payer: Self-pay | Admitting: Internal Medicine

## 2015-10-18 ENCOUNTER — Ambulatory Visit (INDEPENDENT_AMBULATORY_CARE_PROVIDER_SITE_OTHER): Payer: BLUE CROSS/BLUE SHIELD | Admitting: Internal Medicine

## 2015-10-18 VITALS — BP 132/84 | HR 72 | Temp 97.0°F | Resp 16 | Ht 73.75 in | Wt 208.8 lb

## 2015-10-18 DIAGNOSIS — E559 Vitamin D deficiency, unspecified: Secondary | ICD-10-CM | POA: Diagnosis not present

## 2015-10-18 DIAGNOSIS — E349 Endocrine disorder, unspecified: Secondary | ICD-10-CM

## 2015-10-18 DIAGNOSIS — E782 Mixed hyperlipidemia: Secondary | ICD-10-CM

## 2015-10-18 DIAGNOSIS — R7303 Prediabetes: Secondary | ICD-10-CM

## 2015-10-18 DIAGNOSIS — R5383 Other fatigue: Secondary | ICD-10-CM

## 2015-10-18 DIAGNOSIS — Z0000 Encounter for general adult medical examination without abnormal findings: Secondary | ICD-10-CM | POA: Diagnosis not present

## 2015-10-18 DIAGNOSIS — Z125 Encounter for screening for malignant neoplasm of prostate: Secondary | ICD-10-CM | POA: Diagnosis not present

## 2015-10-18 DIAGNOSIS — G4733 Obstructive sleep apnea (adult) (pediatric): Secondary | ICD-10-CM

## 2015-10-18 DIAGNOSIS — Z111 Encounter for screening for respiratory tuberculosis: Secondary | ICD-10-CM

## 2015-10-18 DIAGNOSIS — I10 Essential (primary) hypertension: Secondary | ICD-10-CM | POA: Diagnosis not present

## 2015-10-18 DIAGNOSIS — Z0001 Encounter for general adult medical examination with abnormal findings: Secondary | ICD-10-CM

## 2015-10-18 DIAGNOSIS — Z79899 Other long term (current) drug therapy: Secondary | ICD-10-CM | POA: Diagnosis not present

## 2015-10-18 DIAGNOSIS — Z1212 Encounter for screening for malignant neoplasm of rectum: Secondary | ICD-10-CM

## 2015-10-18 DIAGNOSIS — Z9989 Dependence on other enabling machines and devices: Secondary | ICD-10-CM

## 2015-10-18 NOTE — Patient Instructions (Signed)

## 2015-10-18 NOTE — Progress Notes (Signed)
Patient ID: Tony Bailey, male   DOB: [birth year], 55 y.o.   MRN: [id number]  Annual  Screening/Preventative Visit And Comprehensive Evaluation & Examination  This very nice 55 y.o. MWM presents for presents for a Wellness/Preventative Visit & comprehensive evaluation and management of multiple medical co-morbidities.  Patient has been followed for HTN, Prediabetes, Hyperlipidemia and Vitamin D Deficiency.   HTN predates since Oct 2014. Patient's BP has been controlled at home.Today's BP: 132/84 mmHg. Patient denies any cardiac symptoms as chest pain, palpitations, shortness of breath, dizziness or ankle swelling.   Patient's hyperlipidemia is controlled with diet and medications. Patient denies myalgias or other medication SE's. Last lipids were at goal with Cholesterol 151; HDL 44; LDL 76; Triglycerides 153 on  06/21/2015.   Patient has preDM since Oct 2014 with A1c 5.8% and patient denies reactive hypoglycemic symptoms, visual blurring, diabetic polys or paresthesias. Last A1c was 6.0% on 06/21/2015.   Finally, patient has history of Vitamin D Deficiency of 81 in Oct 2014 and last vitamin D was 71 on  06/21/2015.  Medication Sig  . aspirin 81 MG  Take 81 mg by mouth daily.  Marland Kitchen atorvastatin  80 MG  Take 1 tablet (80 mg total) by mouth daily. Take 1 tab or as directed for cholesterol  . bisoprolol-hctz [facility]) 5-6.25 Take 1 tablet by mouth daily. For BP  . VITAMIN D Take 4,000 Units by mouth 2 (two) times daily.   . Cinnamon 500 MG  Take 500 mg by mouth 4 (four) times daily.  Marland Kitchen FLONASE nasal spray 2 sprays each nostril daily  . Multiple Vitamin  Take 1 tablet by mouth daily.  . Omega-3 FISH OIL Take 1,200 mg by mouth 2 (two) times daily.    No Known Allergies   Past Medical History  Diagnosis Date  . Hypertension   . Hyperlipidemia   . Kidney stones   . Anxiety   . OSA (obstructive sleep apnea)   . Unspecified vitamin D deficiency   . Other testicular hypofunction    Health Maintenance   Topic Date Due  . Hepatitis C Screening  Jun 17, 1961  . HIV Screening  06/23/1974  . TETANUS/TDAP  06/23/1978  . INFLUENZA VACCINE  05/15/2015  . COLONOSCOPY  04/13/2020   Immunization History  Administered Date(s) Administered  . Influenza Split 08/23/2014  . Influenza Whole 08/09/2013  . PPD Test 08/23/2014  . Pneumococcal Conjugate-13 08/23/2014   Past Surgical History  Procedure Laterality Date  . Wisdom tooth extraction  1976  . Nasal septum surgery    . Incision and drainage perirectal abscess    . Melanoma excision  2009  . Hernia repair Left groin    1970  . Hernia repair Right groin    2011   Family History  Problem Relation Age of Onset  . Aneurysm Mother   . Kidney disease Father   . Kidney failure Father    Social History   Social History  . Marital Status: Married    Spouse Name: N/A  . Number of Children: none  . 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 Smoking status: Never Smoker   . Smokeless tobacco: Not on file  . Alcohol Use: No  . Drug Use: No  . Sexual Activity: Active    ROS Constitutional: Denies fever, chills, weight loss/gain, headaches, insomnia,  night sweats or change in appetite. Does c/o fatigue. Eyes:  Denies redness, blurred vision, diplopia, discharge, itchy or watery eyes.  ENT: Denies discharge, congestion, post nasal drip, epistaxis, sore throat, earache, hearing loss, dental pain, Tinnitus, Vertigo, Sinus pain or snoring.  Cardio: Denies chest pain, palpitations, irregular heartbeat, syncope, dyspnea, diaphoresis, orthopnea, PND, claudication or edema Respiratory: denies cough, dyspnea, DOE, pleurisy, hoarseness, laryngitis or wheezing.  Gastrointestinal: Denies dysphagia, heartburn, reflux, water brash, pain, cramps, nausea, vomiting, bloating, diarrhea, constipation, hematemesis, melena, hematochezia, jaundice or hemorrhoids Genitourinary: Denies dysuria,  frequency, urgency, nocturia, hesitancy, discharge, hematuria or flank pain Musculoskeletal: Denies arthralgia, myalgia, stiffness, Jt. Swelling, pain, limp or strain/sprain. Denies Falls. Skin: Denies puritis, rash, hives, warts, acne, eczema or change in skin lesion Neuro: No weakness, tremor, incoordination, spasms, paresthesia or pain Psychiatric: Denies confusion, memory loss or sensory loss. Denies Depression. Endocrine: Denies change in weight, skin, hair change, nocturia, and paresthesia, diabetic polys, visual blurring or hyper / hypo glycemic episodes.  Heme/Lymph: No excessive bleeding, bruising or enlarged lymph nodes.  Physical Exam  BP 132/84 mmHg  Pulse 72  Temp(Src) 97 F (36.1 C)  Resp 16  Ht 6' 1.75" (1.873 m)  Wt 208 lb 12.8 oz (94.711 kg)  BMI 27.00 kg/m2  General Appearance: Well nourished, in no apparent distress. Eyes: PERRLA, EOMs, conjunctiva no swelling or erythema, normal fundi and vessels. Sinuses: No frontal/maxillary tenderness ENT/Mouth: EACs patent / TMs  nl. Nares clear without erythema, swelling, mucoid exudates. Oral hygiene is good. No erythema, swelling, or exudate. Tongue normal, non-obstructing. Tonsils not swollen or erythematous. Hearing normal.  Neck: Supple, thyroid normal. No bruits, nodes or JVD. Respiratory: Respiratory effort normal.  BS equal and clear bilateral without rales, rhonci, wheezing or stridor. Cardio: Heart sounds are normal with regular rate and rhythm and no murmurs, rubs or gallops. Peripheral pulses are normal and equal bilaterally without edema. No aortic or femoral bruits. Chest: symmetric with normal excursions and percussion.  Abdomen: Soft, with Nl bowel sounds. Nontender, no guarding, rebound, hernias, masses, or organomegaly.  Lymphatics: Non tender without lymphadenopathy.  Genitourinary: No hernias.Testes nl. DRE - prostate nl for age - smooth & firm w/o nodules. Musculoskeletal: Full ROM all peripheral  extremities, joint stability, 5/5 strength, and normal gait. Skin: Warm and dry without rashes, lesions, cyanosis, clubbing or  ecchymosis.  Neuro: Cranial nerves intact, reflexes equal bilaterally. Normal muscle tone, no cerebellar symptoms. Sensation intact.  Pysch: Alert and oriented X 3 with normal affect, insight and judgment appropriate.   Assessment and Plan  1. Annual Preventative/Screening Exam    - Microalbumin / creatinine urine ratio - EKG 12-Lead - [place], RETROPERITNL ABD,  LTD - Urinalysis, Routine w reflex microscopic - POC Hemoccult Bld/Stl  - Vitamin B12 - Iron and TIBC - PSA - Testosterone - CBC with Differential/Platelet - Hepatic function panel - BASIC METABOLIC PANEL WITH GFR - Magnesium - Lipid panel - TSH - Hemoglobin A1c - Insulin, random - VITAMIN D 25 Hydroxy   2. Essential hypertension  - Microalbumin / creatinine urine ratio - EKG 12-Lead - [place], RETROPERITNL ABD,  LTD - TSH  3. Hyperlipidemia  - Lipid panel - TSH  4. Prediabetes  - Hemoglobin A1c - Insulin, random  5. Vitamin D deficiency  - VITAMIN D 25 Hydroxy   6. Testosterone deficiency  - Testosterone  7. OSA on CPAP   8. Screening for rectal cancer  - POC Hemoccult Bld/Stl   9. Prostate cancer screening  - PSA  10. Other fatigue  - Vitamin B12 - Iron and  TIBC - Testosterone  11. Medication management  - Urinalysis, Routine w reflex microscopic  12. Screening examination for pulmonary tuberculosis  - PPD   Continue prudent diet as discussed, weight control, BP monitoring, regular exercise, and medications as discussed.  Discussed med effects and SE's. Routine screening labs and tests as requested with regular follow-up as recommended. Over 40 minutes of exam, counseling, chart review and high complex critical decision making was performed

## 2015-10-19 LAB — IRON AND TIBC
%SAT: 23 % (ref 15–60)
Iron: 94 ug/dL (ref 50–180)
TIBC: 409 ug/dL (ref 250–425)
UIBC: 315 ug/dL (ref 125–400)

## 2015-10-19 LAB — CBC WITH DIFFERENTIAL/PLATELET
BASOS ABS: 0.1 10*3/uL (ref 0.0–0.1)
BASOS PCT: 1 % (ref 0–1)
EOS ABS: 0.1 10*3/uL (ref 0.0–0.7)
Eosinophils Relative: 1 % (ref 0–5)
HCT: 42.9 % (ref 39.0–52.0)
HEMOGLOBIN: 14.5 g/dL (ref 13.0–17.0)
LYMPHS ABS: 2 10*3/uL (ref 0.7–4.0)
Lymphocytes Relative: 32 % (ref 12–46)
MCH: 30.5 pg (ref 26.0–34.0)
MCHC: 33.8 g/dL (ref 30.0–36.0)
MCV: 90.3 fL (ref 78.0–100.0)
MONOS PCT: 13 % — AB (ref 3–12)
MPV: 9 fL (ref 8.6–12.4)
Monocytes Absolute: 0.8 10*3/uL (ref 0.1–1.0)
NEUTROS ABS: 3.3 10*3/uL (ref 1.7–7.7)
NEUTROS PCT: 53 % (ref 43–77)
PLATELETS: 359 10*3/uL (ref 150–400)
RBC: 4.75 MIL/uL (ref 4.22–5.81)
RDW: 13.1 % (ref 11.5–15.5)
WBC: 6.3 10*3/uL (ref 4.0–10.5)

## 2015-10-19 LAB — URINALYSIS, ROUTINE W REFLEX MICROSCOPIC
BILIRUBIN URINE: NEGATIVE
GLUCOSE, UA: NEGATIVE
HGB URINE DIPSTICK: NEGATIVE
KETONES UR: NEGATIVE
Leukocytes, UA: NEGATIVE
Nitrite: NEGATIVE
PROTEIN: NEGATIVE
Specific Gravity, Urine: 1.012 (ref 1.001–1.035)
pH: 5.5 (ref 5.0–8.0)

## 2015-10-19 LAB — BASIC METABOLIC PANEL WITH GFR
BUN: 19 mg/dL (ref 7–25)
CHLORIDE: 97 mmol/L — AB (ref 98–110)
CO2: 31 mmol/L (ref 20–31)
CREATININE: 0.92 mg/dL (ref 0.70–1.33)
Calcium: 9.8 mg/dL (ref 8.6–10.3)
GFR, Est African American: >89 mL/min (ref >=60)
GFR, Est Non African American: >89 mL/min (ref >=60)
GLUCOSE: 101 mg/dL — AB (ref 65–99)
POTASSIUM: 4.2 mmol/L (ref 3.5–5.3)
Sodium: 139 mmol/L (ref 135–146)

## 2015-10-19 LAB — MICROALBUMIN / CREATININE URINE RATIO
Creatinine, Urine: 85 mg/dL (ref 20–370)
MICROALB UR: 1.8 mg/dL
MICROALB/CREAT RATIO: 21 ug/mg{creat} (ref <30)

## 2015-10-19 LAB — LIPID PANEL
CHOL/HDL RATIO: 3.8 ratio (ref <=5.0)
Cholesterol: 174 mg/dL (ref 125–200)
HDL: 46 mg/dL (ref >=40)
LDL CALC: 82 mg/dL (ref <130)
Triglycerides: 231 mg/dL — ABNORMAL HIGH (ref <150)
VLDL: 46 mg/dL — AB (ref <30)

## 2015-10-19 LAB — HEPATIC FUNCTION PANEL
ALK PHOS: 54 U/L (ref 40–115)
ALT: 35 U/L (ref 9–46)
AST: 24 U/L (ref 10–35)
Albumin: 4.6 g/dL (ref 3.6–5.1)
BILIRUBIN DIRECT: 0.1 mg/dL (ref <=0.2)
BILIRUBIN INDIRECT: 0.5 mg/dL (ref 0.2–1.2)
BILIRUBIN TOTAL: 0.6 mg/dL (ref 0.2–1.2)
Total Protein: 7.1 g/dL (ref 6.1–8.1)

## 2015-10-19 LAB — VITAMIN D 25 HYDROXY (VIT D DEFICIENCY, FRACTURES): VIT D 25 HYDROXY: 72 ng/mL (ref 30–100)

## 2015-10-19 LAB — VITAMIN B12: Vitamin B-12: 547 pg/mL (ref 211–911)

## 2015-10-19 LAB — MAGNESIUM: MAGNESIUM: 2.1 mg/dL (ref 1.5–2.5)

## 2015-10-19 LAB — PSA: PSA: 1.41 ng/mL (ref <=4.00)

## 2015-10-19 LAB — TESTOSTERONE: TESTOSTERONE: 147 ng/dL — AB (ref 300–890)

## 2015-10-19 LAB — TSH: TSH: 2.081 u[IU]/mL (ref 0.350–4.500)

## 2015-10-19 LAB — HEMOGLOBIN A1C
HEMOGLOBIN A1C: 5.7 % — AB (ref <5.7)
Mean Plasma Glucose: 117 mg/dL — ABNORMAL HIGH (ref <117)

## 2015-10-19 LAB — INSULIN, RANDOM: Insulin: 15.7 u[IU]/mL (ref 2.0–19.6)

## 2015-10-20 ENCOUNTER — Encounter: Payer: Self-pay | Admitting: Internal Medicine

## 2015-10-23 ENCOUNTER — Encounter: Payer: Self-pay | Admitting: Internal Medicine

## 2015-10-23 LAB — TB SKIN TEST
INDURATION: 0 mm
TB SKIN TEST: NEGATIVE

## 2015-10-24 ENCOUNTER — Other Ambulatory Visit: Payer: Self-pay | Admitting: *Deleted

## 2015-10-24 MED ORDER — BISOPROLOL-HYDROCHLOROTHIAZIDE 5-6.25 MG PO TABS: 1.0000 | ORAL_TABLET | Freq: Every day | ORAL | Status: DC

## 2015-10-26 ENCOUNTER — Other Ambulatory Visit: Payer: Self-pay | Admitting: Internal Medicine

## 2015-10-26 ENCOUNTER — Encounter: Payer: Self-pay | Admitting: Internal Medicine

## 2015-10-26 DIAGNOSIS — J208 Acute bronchitis due to other specified organisms: Secondary | ICD-10-CM

## 2015-10-26 MED ORDER — PREDNISONE 20 MG PO TABS: ORAL_TABLET | ORAL | Status: DC

## 2015-10-26 MED ORDER — PROMETHAZINE-DM 6.25-15 MG/5ML PO SYRP: ORAL_SOLUTION | ORAL | Status: DC

## 2015-10-26 MED ORDER — AZITHROMYCIN 250 MG PO TABS: ORAL_TABLET | ORAL | Status: DC

## 2015-11-13 ENCOUNTER — Other Ambulatory Visit: Payer: Self-pay | Admitting: *Deleted

## 2015-11-13 DIAGNOSIS — Z0001 Encounter for general adult medical examination with abnormal findings: Secondary | ICD-10-CM

## 2015-11-13 DIAGNOSIS — Z1212 Encounter for screening for malignant neoplasm of rectum: Secondary | ICD-10-CM

## 2015-11-13 LAB — POC HEMOCCULT BLD/STL (HOME/3-CARD/SCREEN)
Card #2 Fecal Occult Blod, POC: NEGATIVE
Card #3 Fecal Occult Blood, POC: NEGATIVE
Fecal Occult Blood, POC: NEGATIVE

## 2016-01-17 ENCOUNTER — Ambulatory Visit (INDEPENDENT_AMBULATORY_CARE_PROVIDER_SITE_OTHER): Payer: BLUE CROSS/BLUE SHIELD | Admitting: Internal Medicine

## 2016-01-17 ENCOUNTER — Encounter: Payer: Self-pay | Admitting: Internal Medicine

## 2016-01-17 VITALS — BP 142/76 | HR 80 | Temp 98.2°F | Resp 16 | Ht 73.75 in | Wt 215.0 lb

## 2016-01-17 DIAGNOSIS — E559 Vitamin D deficiency, unspecified: Secondary | ICD-10-CM

## 2016-01-17 DIAGNOSIS — I10 Essential (primary) hypertension: Secondary | ICD-10-CM

## 2016-01-17 DIAGNOSIS — E349 Endocrine disorder, unspecified: Secondary | ICD-10-CM

## 2016-01-17 DIAGNOSIS — R7303 Prediabetes: Secondary | ICD-10-CM | POA: Diagnosis not present

## 2016-01-17 DIAGNOSIS — Z79899 Other long term (current) drug therapy: Secondary | ICD-10-CM | POA: Diagnosis not present

## 2016-01-17 DIAGNOSIS — E291 Testicular hypofunction: Secondary | ICD-10-CM | POA: Diagnosis not present

## 2016-01-17 DIAGNOSIS — Z23 Encounter for immunization: Secondary | ICD-10-CM

## 2016-01-17 DIAGNOSIS — E782 Mixed hyperlipidemia: Secondary | ICD-10-CM | POA: Diagnosis not present

## 2016-01-17 LAB — CBC WITH DIFFERENTIAL/PLATELET
BASOS PCT: 1 %
Basophils Absolute: 66 cells/uL (ref 0–200)
EOS PCT: 2 %
Eosinophils Absolute: 132 cells/uL (ref 15–500)
HEMATOCRIT: 42.8 % (ref 38.5–50.0)
HEMOGLOBIN: 14.5 g/dL (ref 13.2–17.1)
LYMPHS ABS: 2178 {cells}/uL (ref 850–3900)
Lymphocytes Relative: 33 %
MCH: 30.2 pg (ref 27.0–33.0)
MCHC: 33.9 g/dL (ref 32.0–36.0)
MCV: 89.2 fL (ref 80.0–100.0)
MONO ABS: 660 {cells}/uL (ref 200–950)
MPV: 9.1 fL (ref 7.5–12.5)
Monocytes Relative: 10 %
NEUTROS ABS: 3564 {cells}/uL (ref 1500–7800)
NEUTROS PCT: 54 %
PLATELETS: 293 10*3/uL (ref 140–400)
RBC: 4.8 MIL/uL (ref 4.20–5.80)
RDW: 12.6 % (ref 11.0–15.0)
WBC: 6.6 10*3/uL (ref 3.8–10.8)

## 2016-01-17 NOTE — Progress Notes (Signed)
Assessment and Plan:  Hypertension:  -Continue medication,  -monitor blood pressure at home.  -Continue DASH diet.   -Reminder to go to the ER if any CP, SOB, nausea, dizziness, severe HA, changes vision/speech, left arm numbness and tingling, and jaw pain.  Cholesterol: -Continue diet and exercise.    Pre-diabetes: -Continue diet and exercise.   Vitamin D Def: -continue medications.   TDAP -tetanus updated today  Continue diet and meds as discussed. Further disposition pending results of labs.  HPI 55 y.o. male  presents for 3 month follow up with hypertension, hyperlipidemia, prediabetes and vitamin D.   His blood pressure has been controlled at home, today their BP is BP: (!) 142/76 mmHg.   He does not workout. He denies chest pain, shortness of breath, dizziness.  He reports that he has been busy with work.     He is on cholesterol medication and denies myalgias. His cholesterol is at goal. The cholesterol last visit was:   Lab Results  Component Value Date   CHOL 174 10/18/2015   HDL 46 10/18/2015   LDLCALC 82 10/18/2015   TRIG 231* 10/18/2015   CHOLHDL 3.8 10/18/2015     He has been working on diet and exercise for prediabetes, and denies foot ulcerations, hyperglycemia, hypoglycemia , increased appetite, nausea, paresthesia of the feet, polydipsia, polyuria, visual disturbances, vomiting and weight loss. Last A1C in the office was:  Lab Results  Component Value Date   HGBA1C 5.7* 10/18/2015    Patient is on Vitamin D supplement.  Lab Results  Component Value Date   VD25OH 72 10/18/2015     He was due for a tetanus shot which he was given today.    Current Medications:  Current Outpatient Prescriptions on File Prior to Visit  Medication Sig Dispense Refill  . aspirin 81 MG tablet Take 81 mg by mouth daily.    Marland Kitchen atorvastatin (LIPITOR) 80 MG tablet Take 1 tablet (80 mg total) by mouth daily. Take 1 tab or as directed for cholesterol 90 tablet 4  .  bisoprolol-hydrochlorothiazide (ZIAC) 5-6.25 MG tablet Take 1 tablet by mouth daily. For BP 90 tablet 3  . Cholecalciferol (VITAMIN D-3 PO) Take 4,000 Units by mouth 2 (two) times daily.     . Cinnamon 500 MG capsule Take 500 mg by mouth 4 (four) times daily.    . fluticasone (FLONASE) 50 MCG/ACT nasal spray 2 sprays each nostril daily 48 g 99  . Multiple Vitamin (MULTIVITAMIN) tablet Take 1 tablet by mouth daily.    . Omega-3 Fatty Acids (FISH OIL PO) Take 1,200 mg by mouth 2 (two) times daily.      No current facility-administered medications on file prior to visit.    Medical History:  Past Medical History  Diagnosis Date  . Hypertension   . Hyperlipidemia   . Kidney stones   . Anxiety   . OSA (obstructive sleep apnea)   . Unspecified vitamin D deficiency   . Other testicular hypofunction     Allergies: No Known Allergies   Review of Systems:  Review of Systems  Constitutional: Negative for fever, chills and malaise/fatigue.  HENT: Negative for congestion, ear pain and sore throat.   Eyes: Negative.   Respiratory: Negative for cough, shortness of breath and wheezing.   Cardiovascular: Negative for chest pain, palpitations and leg swelling.  Gastrointestinal: Negative for heartburn, abdominal pain, diarrhea, constipation, blood in stool and melena.  Genitourinary: Negative.   Skin: Negative.   Neurological: Negative  for dizziness, sensory change, loss of consciousness and headaches.  Psychiatric/Behavioral: Negative for depression. The patient is not nervous/anxious and does not have insomnia.     Family history- Review and unchanged  Social history- Review and unchanged  Physical Exam: BP 142/76 mmHg  Pulse 80  Temp(Src) 98.2 F (36.8 C) (Temporal)  Resp 16  Ht 6' 1.75" (1.873 m)  Wt 215 lb (97.523 kg)  BMI 27.80 kg/m2 Wt Readings from Last 3 Encounters:  01/17/16 215 lb (97.523 kg)  10/18/15 208 lb 12.8 oz (94.711 kg)  06/21/15 211 lb (95.709 kg)     General Appearance: Well nourished well developed, in no apparent distress. Eyes: PERRLA, EOMs, conjunctiva no swelling or erythema ENT/Mouth: Ear canals normal without obstruction, swelling, erythma, discharge.  TMs normal bilaterally.  Oropharynx moist, clear, without exudate, or postoropharyngeal swelling. Neck: Supple, thyroid normal,no cervical adenopathy  Respiratory: Respiratory effort normal, Breath sounds clear A&P without rhonchi, wheeze, or rale.  No retractions, no accessory usage. Cardio: RRR with no MRGs. Brisk peripheral pulses without edema.  Abdomen: Soft, + BS,  Non tender, no guarding, rebound, hernias, masses. Musculoskeletal: Full ROM, 5/5 strength, Normal gait Skin: Warm, dry without rashes, lesions, ecchymosis.  Neuro: Awake and oriented X 3, Cranial nerves intact. Normal muscle tone, no cerebellar symptoms. Psych: Normal affect, Insight and Judgment appropriate.    Starlyn Skeans, PA-C 4:20 PM [facility]

## 2016-01-18 LAB — HEPATIC FUNCTION PANEL
ALBUMIN: 4.4 g/dL (ref 3.6–5.1)
ALK PHOS: 48 U/L (ref 40–115)
ALT: 29 U/L (ref 9–46)
AST: 22 U/L (ref 10–35)
BILIRUBIN INDIRECT: 0.5 mg/dL (ref 0.2–1.2)
BILIRUBIN TOTAL: 0.6 mg/dL (ref 0.2–1.2)
Bilirubin, Direct: 0.1 mg/dL (ref <=0.2)
Total Protein: 6.8 g/dL (ref 6.1–8.1)

## 2016-01-18 LAB — LIPID PANEL
CHOL/HDL RATIO: 3.8 ratio (ref <=5.0)
Cholesterol: 156 mg/dL (ref 125–200)
HDL: 41 mg/dL (ref >=40)
LDL Cholesterol: 52 mg/dL (ref <130)
Triglycerides: 316 mg/dL — ABNORMAL HIGH (ref <150)
VLDL: 63 mg/dL — AB (ref <30)

## 2016-01-18 LAB — BASIC METABOLIC PANEL WITH GFR
BUN: 20 mg/dL (ref 7–25)
CHLORIDE: 103 mmol/L (ref 98–110)
CO2: 22 mmol/L (ref 20–31)
Calcium: 9.3 mg/dL (ref 8.6–10.3)
Creat: 0.79 mg/dL (ref 0.70–1.33)
Glucose, Bld: 99 mg/dL (ref 65–99)
POTASSIUM: 3.6 mmol/L (ref 3.5–5.3)
Sodium: 140 mmol/L (ref 135–146)

## 2016-01-18 LAB — HEMOGLOBIN A1C
HEMOGLOBIN A1C: 6 % — AB (ref <5.7)
MEAN PLASMA GLUCOSE: 126 mg/dL

## 2016-01-18 LAB — TSH: TSH: 1.8 mIU/L (ref 0.40–4.50)

## 2016-01-23 ENCOUNTER — Ambulatory Visit (INDEPENDENT_AMBULATORY_CARE_PROVIDER_SITE_OTHER): Payer: BLUE CROSS/BLUE SHIELD | Admitting: Internal Medicine

## 2016-01-23 ENCOUNTER — Encounter: Payer: Self-pay | Admitting: Internal Medicine

## 2016-01-23 VITALS — BP 160/100 | HR 93 | Temp 97.3°F | Resp 16 | Ht 73.0 in | Wt 211.6 lb

## 2016-01-23 DIAGNOSIS — M5431 Sciatica, right side: Secondary | ICD-10-CM | POA: Diagnosis not present

## 2016-01-23 MED ORDER — DIAZEPAM 5 MG PO TABS: 5.0000 mg | ORAL_TABLET | Freq: Four times a day (QID) | ORAL | Status: DC | PRN

## 2016-01-23 MED ORDER — KETOROLAC TROMETHAMINE 30 MG/ML IJ SOLN
30.0000 mg | Freq: Once | INTRAMUSCULAR | Status: AC
  Administered 2016-01-24: 30 mg via INTRAMUSCULAR

## 2016-01-23 MED ORDER — PREDNISONE 20 MG PO TABS: ORAL_TABLET | ORAL | Status: DC

## 2016-01-23 MED ORDER — TRAMADOL HCL 50 MG PO TABS: 100.0000 mg | ORAL_TABLET | Freq: Four times a day (QID) | ORAL | Status: DC | PRN

## 2016-01-23 NOTE — Patient Instructions (Signed)
Radicular Pain °Radicular pain in either the arm or leg is usually from a bulging or herniated disk in the spine. A piece of the herniated disk may press against the nerves as the nerves exit the spine. This causes pain which is felt at the tips of the nerves down the arm or leg. Other causes of radicular pain may include: °· Fractures. °· Heart disease. °· Cancer. °· An abnormal and usually degenerative state of the nervous system or nerves (neuropathy). °Diagnosis may require CT or MRI scanning to determine the primary cause.  °Nerves that start at the neck (nerve roots) may cause radicular pain in the outer shoulder and arm. It can spread down to the thumb and fingers. The symptoms vary depending on which nerve root has been affected. In most cases radicular pain improves with conservative treatment. Neck problems may require physical therapy, a neck collar, or cervical traction. Treatment may take many weeks, and surgery may be considered if the symptoms do not improve.  °Conservative treatment is also recommended for sciatica. Sciatica causes pain to radiate from the lower back or buttock area down the leg into the foot. Often there is a history of back problems. Most patients with sciatica are better after 2 to 4 weeks of rest and other supportive care. Short term bed rest can reduce the disk pressure considerably. Sitting, however, is not a good position since this increases the pressure on the disk. You should avoid bending, lifting, and all other activities which make the problem worse. Traction can be used in severe cases. Surgery is usually reserved for patients who do not improve within the first months of treatment. °Only take over-the-counter or prescription medicines for pain, discomfort, or fever as directed by your caregiver. Narcotics and muscle relaxants may help by relieving more severe pain and spasm and by providing mild sedation. Cold or massage can give significant relief. Spinal manipulation  is not recommended. It can increase the degree of disc protrusion. Epidural steroid injections are often effective treatment for radicular pain. These injections deliver medicine to the spinal nerve in the space between the protective covering of the spinal cord and back bones (vertebrae). Your caregiver can give you more information about steroid injections. These injections are most effective when given within two weeks of the onset of pain.  °You should see your caregiver for follow up care as recommended. A program for neck and back injury rehabilitation with stretching and strengthening exercises is an important part of management.  °SEEK IMMEDIATE MEDICAL CARE IF: °· You develop increased pain, weakness, or numbness in your arm or leg. °· You develop difficulty with bladder or bowel control. °· You develop abdominal pain. °  °This information is not intended to replace advice given to you by your health care provider. Make sure you discuss any questions you have with your health care provider. °  °Document Released: 11/07/2004 Document Revised: 10/21/2014 Document Reviewed: 04/26/2015 °Elsevier Interactive Patient Education ©2016 [employer] ° °

## 2016-01-23 NOTE — Progress Notes (Signed)
   Subjective:    Patient ID: Tony Bailey, male    DOB: [birth year], 55 y.o.   MRN: [id number]  HPI  Patient presents to the office for evaluation of right hip pain times 2 days.  He describes the pain radiates down the entire right leg.  He reports that the pain is aching and constant.  He reports that pain is worse with walking and pressure.  He does report the pain is exacerbated by twisting.  He has tried [employer] and has also tried advil.  Neither really helped much.  No problems with his back or his hip previously.  He had some sciatca on the left side but not nearly this bad.  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  He has had some numbness to the right foot.    Review of Systems  Constitutional: Negative for fever, chills and fatigue.  Respiratory: Negative for chest tightness and shortness of breath.   Cardiovascular: Negative for chest pain and palpitations.  Gastrointestinal: Negative for nausea, vomiting, abdominal pain, diarrhea, constipation and blood in stool.  Genitourinary: Negative for dysuria, frequency, hematuria, flank pain, decreased urine volume and difficulty urinating.  Musculoskeletal: Positive for back pain. Negative for myalgias, joint swelling, arthralgias, gait problem, neck pain and neck stiffness.  Neurological: Positive for numbness. Negative for dizziness, weakness and headaches.       Objective:   Physical Exam  Constitutional: He is oriented to person, place, and time. He appears well-developed and well-nourished. No distress.  HENT:  Head: Normocephalic.  Mouth/Throat: Oropharynx is clear and moist. No oropharyngeal exudate.  Eyes: Conjunctivae are normal. No scleral icterus.  Neck: Normal range of motion. Neck supple. No JVD present. No thyromegaly present.  Cardiovascular: Normal rate, regular rhythm, normal heart sounds and intact distal pulses.  Exam reveals no gallop and no friction rub.   No murmur heard. Pulmonary/Chest: Effort normal and breath  sounds normal. No respiratory distress. He has no wheezes. He has no rales. He exhibits no tenderness.  Abdominal: Soft. Bowel sounds are normal. He exhibits no distension and no mass. There is no tenderness. There is no rebound and no guarding.  Musculoskeletal: Normal range of motion.  Patient rises slowly from sitting to standing.  They walk with an antalgic gait.  There is no evidence of erythema, ecchymosis, or gross deformity.  There is no tenderness to palpation over the paraspinal muscles of the lumbar spine, buttocks, or bony spine.  Active ROM is limited due to pain.  Sensation to light touch is intact over all extremities.  Strength is symmetric and equal in all extremities.  Positive right straight leg raise   Lymphadenopathy:    He has no cervical adenopathy.  Neurological: He is alert and oriented to person, place, and time.  Skin: Skin is warm and dry. He is not diaphoretic.  Psychiatric: He has a normal mood and affect. His behavior is normal. Judgment and thought content normal.  Nursing note and vitals reviewed.   Filed Vitals:   01/23/16 1145  BP: 160/100  Pulse: 93  Temp: 97.3 F (36.3 C)  Resp: 16          Assessment & Plan:    1. Sciatica of right side -toradol injection -prednisone -meloxicam -valium -ultram -no red flag symptoms -go to er if cauda equina syndrome

## 2016-01-24 DIAGNOSIS — M5431 Sciatica, right side: Secondary | ICD-10-CM

## 2016-01-29 ENCOUNTER — Encounter: Payer: Self-pay | Admitting: Internal Medicine

## 2016-02-06 ENCOUNTER — Ambulatory Visit: Payer: Self-pay | Admitting: Internal Medicine

## 2016-03-31 ENCOUNTER — Encounter: Payer: Self-pay | Admitting: Internal Medicine

## 2016-04-01 ENCOUNTER — Other Ambulatory Visit: Payer: Self-pay | Admitting: Internal Medicine

## 2016-04-01 MED ORDER — FLUTICASONE PROPIONATE 50 MCG/ACT NA SUSP: 2.0000 | Freq: Every day | NASAL | Status: DC

## 2016-04-17 ENCOUNTER — Ambulatory Visit: Payer: Self-pay | Admitting: Internal Medicine

## 2016-05-08 ENCOUNTER — Encounter: Payer: Self-pay | Admitting: Internal Medicine

## 2016-06-18 ENCOUNTER — Other Ambulatory Visit: Payer: Self-pay | Admitting: Internal Medicine

## 2016-06-18 DIAGNOSIS — E782 Mixed hyperlipidemia: Secondary | ICD-10-CM

## 2016-06-20 ENCOUNTER — Other Ambulatory Visit: Payer: Self-pay | Admitting: *Deleted

## 2016-06-20 DIAGNOSIS — E782 Mixed hyperlipidemia: Secondary | ICD-10-CM

## 2016-06-20 MED ORDER — ATORVASTATIN CALCIUM 80 MG PO TABS: ORAL_TABLET | ORAL | 0 refills | Status: DC

## 2016-07-30 ENCOUNTER — Encounter: Payer: Self-pay | Admitting: Internal Medicine

## 2016-07-30 ENCOUNTER — Ambulatory Visit (INDEPENDENT_AMBULATORY_CARE_PROVIDER_SITE_OTHER): Payer: BLUE CROSS/BLUE SHIELD | Admitting: Internal Medicine

## 2016-07-30 VITALS — BP 140/90 | HR 95 | Temp 97.3°F | Resp 16 | Ht 73.0 in | Wt 208.0 lb

## 2016-07-30 DIAGNOSIS — R7303 Prediabetes: Secondary | ICD-10-CM

## 2016-07-30 DIAGNOSIS — E782 Mixed hyperlipidemia: Secondary | ICD-10-CM | POA: Diagnosis not present

## 2016-07-30 DIAGNOSIS — Z23 Encounter for immunization: Secondary | ICD-10-CM

## 2016-07-30 DIAGNOSIS — Z79899 Other long term (current) drug therapy: Secondary | ICD-10-CM

## 2016-07-30 DIAGNOSIS — I10 Essential (primary) hypertension: Secondary | ICD-10-CM | POA: Diagnosis not present

## 2016-07-30 DIAGNOSIS — E559 Vitamin D deficiency, unspecified: Secondary | ICD-10-CM | POA: Diagnosis not present

## 2016-07-30 NOTE — Progress Notes (Signed)
[facility] [name], M.D.        Uvaldo Bristle. Silverio Lay, P.A.-C       Starlyn Skeans, P.A.-C  [facility]                [address] [place], [place] [id number] Telephone [phone] Telefax [phone] ______________________________________________________________________     This very nice 55 y.o. MWM presents for follow up with Hypertension, Hyperlipidemia, Pre-Diabetes and Vitamin D Deficiency.      Patient is treated for HTN (Oct 2014) & BP has been controlled at home. Today's BP is  140/90. Patient has had no complaints of any cardiac type chest pain, palpitations, dyspnea/orthopnea/PND, dizziness, claudication, or dependent edema.     Hyperlipidemia is controlled with diet & meds. Patient denies myalgias or other med SE's. Last Lipids were at goal albeit elevated Trig's: Lab Results  Component Value Date   CHOL 156 01/17/2016   HDL 41 01/17/2016   LDLCALC 52 01/17/2016   TRIG 316 (H) 01/17/2016   CHOLHDL 3.8 01/17/2016      Also, the patient has history of PreDiabetes circa Oct 2014 with A1c 5.8% and has had no symptoms of reactive hypoglycemia, diabetic polys, paresthesias or visual blurring.  Last A1c was not at goal: Lab Results  Component Value Date   HGBA1C 6.0 (H) 01/17/2016      Further, the patient also has history of Vitamin D Deficiency and supplements vitamin D without any suspected side-effects. Last vitamin D was at goal:  Lab Results  Component Value Date   VD25OH 72 10/18/2015   Current Outpatient Prescriptions on File Prior to Visit  Medication Sig  . aspirin 81 MG tablet Take 81 mg by mouth daily.  Marland Kitchen atorvastatin (LIPITOR) 80 MG tablet TAKE 1 BY MOUTH DAILY OR AS DIRECTED FOR CHOLESTEROL  . bisoprolol-hydrochlorothiazide (ZIAC) 5-6.25 MG tablet Take 1 tablet by mouth daily. For BP  . Cholecalciferol (VITAMIN D-3 PO) Take 4,000 Units by mouth 2 (two) times daily.    . Cinnamon 500 MG capsule Take 500 mg by mouth 4 (four) times daily.  . fluticasone (FLONASE) 50 MCG/ACT nasal spray Place 2 sprays into both nostrils daily.  . Multiple Vitamin (MULTIVITAMIN) tablet Take 1 tablet by mouth daily.  . Omega-3 Fatty Acids (FISH OIL PO) Take 1,200 mg by mouth 2 (two) times daily.   . traMADol (ULTRAM) 50 MG tablet Take 2 tablets (100 mg total) by mouth every 6 (six) hours as needed.   No current facility-administered medications on file prior to visit.    No Known Allergies PMHx:   Past Medical History:  Diagnosis Date  . Anxiety   . Hyperlipidemia   . Hypertension   . Kidney stones   . OSA (obstructive sleep apnea)   . Other testicular hypofunction   . Unspecified vitamin D deficiency    Immunization History  Administered Date(s) Administered  . Influenza Split 08/23/2014  . Influenza Whole 08/09/2013  . Influenza, Seasonal, Injecte, Preservative Fre 07/30/2016  . PPD Test 08/23/2014, 10/18/2015  . Pneumococcal Conjugate-13 08/23/2014  . Tdap 01/17/2016   Past Surgical History:  Procedure Laterality Date  . HERNIA REPAIR Left groin   1970  . HERNIA REPAIR Right groin   2011  . INCISION AND DRAINAGE PERIRECTAL ABSCESS    . MELANOMA EXCISION  2009  . NASAL SEPTUM SURGERY    .  WISDOM TOOTH EXTRACTION  1976   FHx:    Reviewed / unchanged  SHx:    Reviewed / unchanged  Systems Review:  Constitutional: Denies fever, chills, wt changes, headaches, insomnia, fatigue, night sweats, change in appetite. Eyes: Denies redness, blurred vision, diplopia, discharge, itchy, watery eyes.  ENT: Denies discharge, congestion, post nasal drip, epistaxis, sore throat, earache, hearing loss, dental pain, tinnitus, vertigo, sinus pain, snoring.  CV: Denies chest pain, palpitations, irregular heartbeat, syncope, dyspnea, diaphoresis, orthopnea, PND, claudication or edema. Respiratory: denies cough, dyspnea, DOE, pleurisy, hoarseness, laryngitis, wheezing.   Gastrointestinal: Denies dysphagia, odynophagia, heartburn, reflux, water brash, abdominal pain or cramps, nausea, vomiting, bloating, diarrhea, constipation, hematemesis, melena, hematochezia  or hemorrhoids. Genitourinary: Denies dysuria, frequency, urgency, nocturia, hesitancy, discharge, hematuria or flank pain. Musculoskeletal: Denies arthralgias, myalgias, stiffness, jt. swelling, pain, limping or strain/sprain.  Skin: Denies pruritus, rash, hives, warts, acne, eczema or change in skin lesion(s). Neuro: No weakness, tremor, incoordination, spasms, paresthesia or pain. Psychiatric: Denies confusion, memory loss or sensory loss. Endo: Denies change in weight, skin or hair change.  Heme/Lymph: No excessive bleeding, bruising or enlarged lymph nodes.  Physical Exam BP 140/90   Pulse 95   Temp 97.3 F (36.3 C)   Resp 16   Ht 6\' 1"  (1.854 m)   Wt 208 lb (94.3 kg)   SpO2 96%   BMI 27.44 kg/m   Appears well nourished and in no distress.  Eyes: PERRLA, EOMs, conjunctiva no swelling or erythema. Sinuses: No frontal/maxillary tenderness ENT/Mouth: EAC's clear, TM's nl w/o erythema, bulging. Nares clear w/o erythema, swelling, exudates. Oropharynx clear without erythema or exudates. Oral hygiene is good. Tongue normal, non obstructing. Hearing intact.  Neck: Supple. Thyroid nl. Car 2+/2+ without bruits, nodes or JVD. Chest: Respirations nl with BS clear & equal w/o rales, rhonchi, wheezing or stridor.  Cor: Heart sounds normal w/ regular rate and rhythm without sig. murmurs, gallops, clicks, or rubs. Peripheral pulses normal and equal  without edema.  Abdomen: Soft & bowel sounds normal. Non-tender w/o guarding, rebound, hernias, masses, or organomegaly.  Lymphatics: Unremarkable.  Musculoskeletal: Full ROM all peripheral extremities, joint stability, 5/5 strength, and normal gait.  Skin: Warm, dry without exposed rashes, lesions or ecchymosis apparent.  Neuro: Cranial nerves intact,  reflexes equal bilaterally. Sensory-motor testing grossly intact. Tendon reflexes grossly intact.  Pysch: Alert & oriented x 3.  Insight and judgement nl & appropriate. No ideations.  Assessment and Plan:   1. Essential hypertension  - Continue medication, monitor blood pressure at home. Continue DASH diet. Reminder to go to the ER if any CP, SOB, nausea, dizziness, severe HA, changes vision/speech, left arm numbness and tingling and jaw pain.  - TSH  2. Hyperlipidemia  - Continue diet/meds, exercise,& lifestyle modifications. Continue monitor periodic cholesterol/liver & renal functions - Lipid panel - TSH  3. Prediabetes  - Continue diet, exercise, lifestyle modifications. Monitor appropriate labs. - Hemoglobin A1c - Insulin, random  4. Vitamin D deficiency  - Continue supplementation. - VITAMIN D 25 Hydroxy   5. Needs flu shot  - Flu vaccine 6-34mo preservative free IM  6. Medication management  - CBC with Differential/Platelet - BASIC METABOLIC PANEL WITH GFR - Hepatic function panel - Magnesium       Recommended regular exercise, BP monitoring, weight control, and discussed med and SE's. Recommended labs to assess and monitor clinical status. Further disposition pending results of labs. Over 30 minutes of exam, counseling, chart review was performed

## 2016-07-30 NOTE — Patient Instructions (Signed)

## 2016-07-31 LAB — BASIC METABOLIC PANEL WITH GFR
BUN: 20 mg/dL (ref 7–25)
CALCIUM: 9.6 mg/dL (ref 8.6–10.3)
CO2: 26 mmol/L (ref 20–31)
Chloride: 102 mmol/L (ref 98–110)
Creat: 0.88 mg/dL (ref 0.70–1.33)
GLUCOSE: 92 mg/dL (ref 65–99)
Potassium: 4.2 mmol/L (ref 3.5–5.3)
Sodium: 140 mmol/L (ref 135–146)

## 2016-07-31 LAB — HEPATIC FUNCTION PANEL
ALBUMIN: 4.3 g/dL (ref 3.6–5.1)
ALT: 24 U/L (ref 9–46)
AST: 21 U/L (ref 10–35)
Alkaline Phosphatase: 59 U/L (ref 40–115)
BILIRUBIN INDIRECT: 0.4 mg/dL (ref 0.2–1.2)
Bilirubin, Direct: 0.1 mg/dL (ref <=0.2)
TOTAL PROTEIN: 6.7 g/dL (ref 6.1–8.1)
Total Bilirubin: 0.5 mg/dL (ref 0.2–1.2)

## 2016-07-31 LAB — LIPID PANEL
CHOLESTEROL: 179 mg/dL (ref 125–200)
HDL: 50 mg/dL (ref >=40)
LDL CALC: 80 mg/dL (ref <130)
Total CHOL/HDL Ratio: 3.6 Ratio (ref <=5.0)
Triglycerides: 246 mg/dL — ABNORMAL HIGH (ref <150)
VLDL: 49 mg/dL — AB (ref <30)

## 2016-07-31 LAB — CBC WITH DIFFERENTIAL/PLATELET
BASOS ABS: 62 {cells}/uL (ref 0–200)
Basophils Relative: 1 %
EOS ABS: 62 {cells}/uL (ref 15–500)
Eosinophils Relative: 1 %
HEMATOCRIT: 44 % (ref 38.5–50.0)
HEMOGLOBIN: 14.7 g/dL (ref 13.2–17.1)
LYMPHS ABS: 1922 {cells}/uL (ref 850–3900)
LYMPHS PCT: 31 %
MCH: 29.9 pg (ref 27.0–33.0)
MCHC: 33.4 g/dL (ref 32.0–36.0)
MCV: 89.4 fL (ref 80.0–100.0)
MONO ABS: 806 {cells}/uL (ref 200–950)
MPV: 8.6 fL (ref 7.5–12.5)
Monocytes Relative: 13 %
NEUTROS PCT: 54 %
Neutro Abs: 3348 cells/uL (ref 1500–7800)
Platelets: 314 10*3/uL (ref 140–400)
RBC: 4.92 MIL/uL (ref 4.20–5.80)
RDW: 13.3 % (ref 11.0–15.0)
WBC: 6.2 10*3/uL (ref 3.8–10.8)

## 2016-07-31 LAB — HEMOGLOBIN A1C
HEMOGLOBIN A1C: 5.6 % (ref <5.7)
MEAN PLASMA GLUCOSE: 114 mg/dL

## 2016-07-31 LAB — MAGNESIUM: Magnesium: 2 mg/dL (ref 1.5–2.5)

## 2016-07-31 LAB — INSULIN, RANDOM: INSULIN: 10.9 u[IU]/mL (ref 2.0–19.6)

## 2016-07-31 LAB — VITAMIN D 25 HYDROXY (VIT D DEFICIENCY, FRACTURES): Vit D, 25-Hydroxy: 65 ng/mL (ref 30–100)

## 2016-07-31 LAB — TSH: TSH: 1.68 mIU/L (ref 0.40–4.50)

## 2016-08-27 ENCOUNTER — Other Ambulatory Visit: Payer: Self-pay | Admitting: Physician Assistant

## 2016-08-27 ENCOUNTER — Encounter: Payer: Self-pay | Admitting: Internal Medicine

## 2016-08-27 MED ORDER — FLUTICASONE PROPIONATE 50 MCG/ACT NA SUSP: 2.0000 | Freq: Every day | NASAL | 3 refills | Status: DC

## 2016-09-05 ENCOUNTER — Telehealth: Payer: BLUE CROSS/BLUE SHIELD | Admitting: Physician Assistant

## 2016-09-05 DIAGNOSIS — R6889 Other general symptoms and signs: Secondary | ICD-10-CM

## 2016-09-05 MED ORDER — BENZONATATE 100 MG PO CAPS: 100.0000 mg | ORAL_CAPSULE | Freq: Two times a day (BID) | ORAL | 0 refills | Status: DC | PRN

## 2016-09-05 NOTE — Progress Notes (Signed)
E visit for Flu like symptoms   We are sorry that you are not feeling well.  Here is how we plan to help! Based on what you have shared with me it looks like you may have a respiratory virus that may be influenza.  Influenza or "the flu" is   an infection caused by a respiratory virus. The flu virus is highly contagious and persons who did not receive their yearly flu vaccination may "catch" the flu from close contact.  We have anti-viral medications to treat the viruses that cause this infection. They are not a "cure" and only shorten the course of the infection. These prescriptions are effective when they are given within the first 2 days of "flu" symptoms. Antiviral medication are indicated if you have a high risk of complications from the flu. You should  also consider an antiviral medication if you are in close contact with someone who is at risk. These medications can help patients avoid complications from the flu  but have side effects that you should know. Possible side effects from Tamiflu or oseltamivir include nausea, vomiting, diarrhea, dizziness, headaches, eye redness, sleep problems or other respiratory symptoms. You should not take Tamiflu if you have an allergy to oseltamivir or any to the ingredients in Tamiflu.  Based upon your symptoms and potential risk factors I recommend that you follow the flu symptoms recommendation that I have listed below.  ANYONE WHO HAS FLU SYMPTOMS SHOULD: . Stay home. The flu is highly contagious and going out or to work exposes others! . Be sure to drink plenty of fluids. Water is fine as well as fruit juices, sodas and electrolyte beverages. You may want to stay away from caffeine or alcohol. If you are nauseated, try taking small sips of liquids. How do you know if you are getting enough fluid? Your urine should be a pale yellow or almost colorless. . Get rest. . Taking a steamy shower or using a humidifier may help nasal congestion and ease sore  throat pain. Using a saline nasal spray works much the same way. . Cough drops, hard candies and sore throat lozenges may ease your cough. . Line up a caregiver. Have someone check on you regularly.   GET HELP RIGHT AWAY IF: . You cannot keep down liquids or your medications. . You become short of breath . Your fell like you are going to pass out or loose consciousness. . Your symptoms persist after you have completed your treatment plan MAKE SURE YOU   Understand these instructions.  Will watch your condition.  Will get help right away if you are not doing well or get worse.  Your e-visit answers were reviewed by a board certified advanced clinical practitioner to complete your personal care plan.  Depending on the condition, your plan could have included both over the counter or prescription medications.  If there is a problem please reply  once you have received a response from your provider.  Your safety is important to [place].  If you have drug allergies check your prescription carefully.    You can use MyChart to ask questions about today's visit, request a non-urgent call back, or ask for a work or school excuse for 24 hours related to this e-Visit. If it has been greater than 24 hours you will need to follow up with your provider, or enter a new e-Visit to address those concerns.  You will get an e-mail in the next two days asking about your  experience.  I hope that your e-visit has been valuable and will speed your recovery. Thank you for using e-visits.

## 2016-09-05 NOTE — Addendum Note (Signed)
Addended by: Raiford Noble on: 09/05/2016 09:33 PM   Modules accepted: Orders

## 2016-11-04 ENCOUNTER — Encounter: Payer: Self-pay | Admitting: Internal Medicine

## 2016-11-06 ENCOUNTER — Encounter: Payer: Self-pay | Admitting: Internal Medicine

## 2017-01-29 ENCOUNTER — Other Ambulatory Visit: Payer: Self-pay | Admitting: *Deleted

## 2017-01-29 ENCOUNTER — Encounter: Payer: Self-pay | Admitting: Internal Medicine

## 2017-01-29 ENCOUNTER — Ambulatory Visit (INDEPENDENT_AMBULATORY_CARE_PROVIDER_SITE_OTHER): Payer: BLUE CROSS/BLUE SHIELD | Admitting: Internal Medicine

## 2017-01-29 VITALS — BP 140/86 | HR 88 | Temp 97.3°F | Resp 16 | Ht 73.5 in | Wt 212.4 lb

## 2017-01-29 DIAGNOSIS — Z79899 Other long term (current) drug therapy: Secondary | ICD-10-CM

## 2017-01-29 DIAGNOSIS — E782 Mixed hyperlipidemia: Secondary | ICD-10-CM

## 2017-01-29 DIAGNOSIS — Z111 Encounter for screening for respiratory tuberculosis: Secondary | ICD-10-CM | POA: Diagnosis not present

## 2017-01-29 DIAGNOSIS — Z125 Encounter for screening for malignant neoplasm of prostate: Secondary | ICD-10-CM

## 2017-01-29 DIAGNOSIS — E349 Endocrine disorder, unspecified: Secondary | ICD-10-CM

## 2017-01-29 DIAGNOSIS — E559 Vitamin D deficiency, unspecified: Secondary | ICD-10-CM | POA: Diagnosis not present

## 2017-01-29 DIAGNOSIS — Z0000 Encounter for general adult medical examination without abnormal findings: Secondary | ICD-10-CM | POA: Diagnosis not present

## 2017-01-29 DIAGNOSIS — Z0001 Encounter for general adult medical examination with abnormal findings: Secondary | ICD-10-CM

## 2017-01-29 DIAGNOSIS — R7303 Prediabetes: Secondary | ICD-10-CM

## 2017-01-29 DIAGNOSIS — Z1212 Encounter for screening for malignant neoplasm of rectum: Secondary | ICD-10-CM

## 2017-01-29 DIAGNOSIS — Z136 Encounter for screening for cardiovascular disorders: Secondary | ICD-10-CM | POA: Diagnosis not present

## 2017-01-29 DIAGNOSIS — L709 Acne, unspecified: Secondary | ICD-10-CM

## 2017-01-29 DIAGNOSIS — I10 Essential (primary) hypertension: Secondary | ICD-10-CM

## 2017-01-29 DIAGNOSIS — Z9989 Dependence on other enabling machines and devices: Secondary | ICD-10-CM

## 2017-01-29 DIAGNOSIS — G4733 Obstructive sleep apnea (adult) (pediatric): Secondary | ICD-10-CM

## 2017-01-29 DIAGNOSIS — R5383 Other fatigue: Secondary | ICD-10-CM

## 2017-01-29 LAB — BASIC METABOLIC PANEL WITH GFR
BUN: 17 mg/dL (ref 7–25)
CO2: 26 mmol/L (ref 20–31)
CREATININE: 0.95 mg/dL (ref 0.70–1.33)
Calcium: 9.9 mg/dL (ref 8.6–10.3)
Chloride: 104 mmol/L (ref 98–110)
GFR, Est Non African American: 88 mL/min (ref >=60)
GLUCOSE: 87 mg/dL (ref 65–99)
POTASSIUM: 4.1 mmol/L (ref 3.5–5.3)
Sodium: 142 mmol/L (ref 135–146)

## 2017-01-29 LAB — CBC WITH DIFFERENTIAL/PLATELET
BASOS PCT: 1 %
Basophils Absolute: 62 cells/uL (ref 0–200)
EOS ABS: 62 {cells}/uL (ref 15–500)
Eosinophils Relative: 1 %
HCT: 45.2 % (ref 38.5–50.0)
Hemoglobin: 15.3 g/dL (ref 13.2–17.1)
LYMPHS PCT: 29 %
Lymphs Abs: 1798 cells/uL (ref 850–3900)
MCH: 30.2 pg (ref 27.0–33.0)
MCHC: 33.8 g/dL (ref 32.0–36.0)
MCV: 89.3 fL (ref 80.0–100.0)
MONO ABS: 620 {cells}/uL (ref 200–950)
MONOS PCT: 10 %
MPV: 9.1 fL (ref 7.5–12.5)
NEUTROS PCT: 59 %
Neutro Abs: 3658 cells/uL (ref 1500–7800)
PLATELETS: 311 10*3/uL (ref 140–400)
RBC: 5.06 MIL/uL (ref 4.20–5.80)
RDW: 13.1 % (ref 11.0–15.0)
WBC: 6.2 10*3/uL (ref 3.8–10.8)

## 2017-01-29 LAB — HEPATIC FUNCTION PANEL
ALBUMIN: 4.5 g/dL (ref 3.6–5.1)
ALK PHOS: 54 U/L (ref 40–115)
ALT: 28 U/L (ref 9–46)
AST: 24 U/L (ref 10–35)
BILIRUBIN TOTAL: 0.5 mg/dL (ref 0.2–1.2)
Bilirubin, Direct: 0.1 mg/dL (ref <=0.2)
Indirect Bilirubin: 0.4 mg/dL (ref 0.2–1.2)
Total Protein: 7.1 g/dL (ref 6.1–8.1)

## 2017-01-29 LAB — LIPID PANEL
Cholesterol: 198 mg/dL (ref <200)
HDL: 50 mg/dL (ref >40)
LDL CALC: 90 mg/dL (ref <100)
TRIGLYCERIDES: 289 mg/dL — AB (ref <150)
Total CHOL/HDL Ratio: 4 Ratio (ref <5.0)
VLDL: 58 mg/dL — ABNORMAL HIGH (ref <30)

## 2017-01-29 LAB — IRON AND TIBC
%SAT: 23 % (ref 15–60)
Iron: 92 ug/dL (ref 50–180)
TIBC: 406 ug/dL (ref 250–425)
UIBC: 314 ug/dL (ref 125–400)

## 2017-01-29 LAB — TSH: TSH: 2.06 m[IU]/L (ref 0.40–4.50)

## 2017-01-29 MED ORDER — FLUTICASONE PROPIONATE 50 MCG/ACT NA SUSP: 2.0000 | Freq: Every day | NASAL | 1 refills | Status: DC

## 2017-01-29 MED ORDER — BISOPROLOL-HYDROCHLOROTHIAZIDE 5-6.25 MG PO TABS: 1.0000 | ORAL_TABLET | Freq: Every day | ORAL | 1 refills | Status: DC

## 2017-01-29 MED ORDER — CLINDAMYCIN PHOSPHATE 1 % EX GEL: Freq: Two times a day (BID) | CUTANEOUS | 3 refills | Status: DC

## 2017-01-29 MED ORDER — ATORVASTATIN CALCIUM 80 MG PO TABS: ORAL_TABLET | ORAL | 1 refills | Status: DC

## 2017-01-29 NOTE — Progress Notes (Signed)
[facility]   [name], M.D.    Tony Bailey. Silverio Lay, P.A.-C  [facility]                [address] [place], [place] [address] Telephone [phone] Telefax [phone] Annual  Screening/Preventative Visit  & Comprehensive Evaluation & Examination     This very nice 56 y.o. MWM presents for a Screening/Preventative Visit & comprehensive evaluation and management of multiple medical co-morbidities.  Patient has been followed for HTN, Prediabetes, Hyperlipidemia and Vitamin D Deficiency.     HTN predates since 2014. Patient's BP has been controlled at home.  Today's BP is sl elevated at 152/ 90 and rechecked at 140 / 86.  Patient denies any cardiac symptoms as chest pain, palpitations, shortness of breath, dizziness or ankle swelling.     Patient's hyperlipidemia is controlled with diet and medications. Patient denies myalgias or other medication SE's. Last lipids were at goal albeit elevated Trig's: Lab Results  Component Value Date   CHOL 179 07/30/2016   HDL 50 07/30/2016   LDLCALC 80 07/30/2016   TRIG 246 (H) 07/30/2016   CHOLHDL 3.6 07/30/2016      Patient has prediabetes (A1c 5.8% in 2014) and patient denies reactive hypoglycemic symptoms, visual blurring, diabetic polys or paresthesias. Last A1c was at goal: Lab Results  Component Value Date   HGBA1C 5.6 07/30/2016       Finally, patient has history of Vitamin D Deficiency of    and last vitamin D was at goal: Lab Results  Component Value Date   VD25OH 65 07/30/2016   Current Outpatient Prescriptions on File Prior to Visit  Medication Sig  . aspirin 81 MG tablet Take 81 mg by mouth daily.  . Cholecalciferol (VITAMIN D-3 PO) Take 4,000 Units by mouth 2 (two) times daily.   . Cinnamon 500 MG capsule Take 500 mg by mouth 4 (four) times daily.  . Multiple Vitamin (MULTIVITAMIN) tablet Take 1 tablet by mouth daily.  . Omega-3 Fatty  Acids (FISH OIL PO) Take 1,200 mg by mouth 2 (two) times daily.    No current facility-administered medications on file prior to visit.    No Known Allergies   Past Medical History:  Diagnosis Date  . Anxiety   . Hyperlipidemia   . Hypertension   . Kidney stones   . OSA (obstructive sleep apnea)   . Other testicular hypofunction   . Unspecified vitamin D deficiency    Health Maintenance  Topic Date Due  . Hepatitis C Screening  06-Sep-1961  . HIV Screening  06/23/1974  . INFLUENZA VACCINE  05/14/2017  . COLONOSCOPY  04/13/2020  . TETANUS/TDAP  01/16/2026   Immunization History  Administered Date(s) Administered  . Influenza Split 08/23/2014  . Influenza Whole 08/09/2013  . Influenza, Seasonal, Injecte, Preservative Fre 07/30/2016  . PPD Test 08/23/2014, 10/18/2015  . Pneumococcal Conjugate-13 08/23/2014  . Tdap 01/17/2016   Past Surgical History:  Procedure Laterality Date  . HERNIA REPAIR Left groin   1970  . HERNIA REPAIR Right groin   2011  . INCISION AND DRAINAGE PERIRECTAL ABSCESS    . MELANOMA EXCISION  2009  . NASAL SEPTUM SURGERY    . WISDOM TOOTH EXTRACTION  1976   Family History  Problem Relation Age of Onset  . Aneurysm Mother   . Kidney disease Father   .  Kidney failure Father    Social History   Social History  . Marital status: Married    Spouse name: N/A  . Number of children: N/A  . Years of education: N/A   Occupational History  . Not on file.   Social History Main Topics  . Smoking status: Never Smoker  . Smokeless tobacco: Not on file  . Alcohol use No  . Drug use: No  . Sexual activity: Not on file    ROS Constitutional: Denies fever, chills, weight loss/gain, headaches, insomnia,  night sweats or change in appetite. Does c/o fatigue. Eyes: Denies redness, blurred vision, diplopia, discharge, itchy or watery eyes.  ENT: Denies discharge, congestion, post nasal drip, epistaxis, sore throat, earache, hearing loss, dental pain,  Tinnitus, Vertigo, Sinus pain or snoring.  Cardio: Denies chest pain, palpitations, irregular heartbeat, syncope, dyspnea, diaphoresis, orthopnea, PND, claudication or edema Respiratory: denies cough, dyspnea, DOE, pleurisy, hoarseness, laryngitis or wheezing.  Gastrointestinal: Denies dysphagia, heartburn, reflux, water brash, pain, cramps, nausea, vomiting, bloating, diarrhea, constipation, hematemesis, melena, hematochezia, jaundice or hemorrhoids Genitourinary: Denies dysuria, frequency, urgency, nocturia, hesitancy, discharge, hematuria or flank pain Musculoskeletal: Denies arthralgia, myalgia, stiffness, Jt. Swelling, pain, limp or strain/sprain. Denies Falls. Skin: Denies puritis, rash, hives, warts, acne, eczema or change in skin lesion Neuro: No weakness, tremor, incoordination, spasms, paresthesia or pain Psychiatric: Denies confusion, memory loss or sensory loss. Denies Depression. Endocrine: Denies change in weight, skin, hair change, nocturia, and paresthesia, diabetic polys, visual blurring or hyper / hypo glycemic episodes.  Heme/Lymph: No excessive bleeding, bruising or enlarged lymph nodes.  Physical Exam  BP 140/86   Pulse 88   Temp 97.3 F (36.3 C)   Resp 16   Ht 6' 1.5" (1.867 m)   Wt 212 lb 6.4 oz (96.3 kg)   BMI 27.64 kg/m   General Appearance: Well nourished and well groomed and in no apparent distress.  Eyes: PERRLA, EOMs, conjunctiva no swelling or erythema, normal fundi and vessels. Sinuses: No frontal/maxillary tenderness ENT/Mouth: EACs patent / TMs  nl. Nares clear without erythema, swelling, mucoid exudates. Oral hygiene is good. No erythema, swelling, or exudate. Tongue normal, non-obstructing. Tonsils not swollen or erythematous. Hearing normal.  Neck: Supple, thyroid normal. No bruits, nodes or JVD. Respiratory: Respiratory effort normal.  BS equal and clear bilateral without rales, rhonci, wheezing or stridor. Cardio: Heart sounds are normal with  regular rate and rhythm and no murmurs, rubs or gallops. Peripheral pulses are normal and equal bilaterally without edema. No aortic or femoral bruits. Chest: symmetric with normal excursions and percussion.  Abdomen: Soft, with Nl bowel sounds. Nontender, no guarding, rebound, hernias, masses, or organomegaly.  Lymphatics: Non tender without lymphadenopathy.  Genitourinary: No hernias.Testes nl. DRE - prostate nl for age - smooth & firm w/o nodules. Musculoskeletal: Full ROM all peripheral extremities, joint stability, 5/5 strength, and normal gait. Skin: Warm and dry without rashes, lesions, cyanosis, clubbing or  ecchymosis.  Neuro: Cranial nerves intact, reflexes equal bilaterally. Normal muscle tone, no cerebellar symptoms. Sensation intact.  Pysch: Alert and oriented X 3 with normal affect, insight and judgment appropriate.   Assessment and Plan  1. Annual Preventative/Screening Exam    2. Essential hypertension  - EKG 12-Lead - [place], RETROPERITNL ABD,  LTD - Urinalysis, Routine w reflex microscopic - Microalbumin / creatinine urine ratio - CBC with Differential/Platelet - BASIC METABOLIC PANEL WITH GFR - Magnesium - TSH  3. Hyperlipemia, mixed  - EKG 12-Lead - [place], RETROPERITNL ABD,  LTD -  Hepatic function panel - Lipid panel - TSH  4. Prediabetes  - EKG 12-Lead - [place], RETROPERITNL ABD,  LTD - Hemoglobin A1c - Insulin, random  5. Vitamin D deficiency  - VITAMIN D 25 Hydroxy   6. Testosterone deficiency  - Testosterone  7. OSA on CPAP   8. Screening for rectal cancer  - POC Hemoccult Bld/Stl   9. Prostate cancer screening  - PSA  10. Screening examination for pulmonary tuberculosis  - PPD  11. Screening for ischemic heart disease  - EKG 12-Lead  12. Screening for AAA (aortic abdominal aneurysm)  - [place], RETROPERITNL ABD,  LTD  13. Fatigue  - Vitamin B12 - Iron and TIBC - Testosterone - CBC with Differential/Platelet  14. Medication  management  - Urinalysis, Routine w reflex microscopic - Microalbumin / creatinine urine ratio - CBC with Differential/Platelet - BASIC METABOLIC PANEL WITH GFR - Hepatic function panel - Magnesium - Lipid panel - TSH - Hemoglobin A1c - Insulin, random - VITAMIN D 25 Hydroxy        Patient was counseled in prudent diet, weight contro to achieve/maintain BMI less than 25, BP monitoring, regular exercise and medications as discussed.  Discussed med effects and SE's. Routine screening labs and tests as requested with regular follow-up as recommended. Over 40 minutes of exam, counseling, chart review and high complex critical decision making was performed

## 2017-01-29 NOTE — Patient Instructions (Signed)

## 2017-01-30 LAB — HEMOGLOBIN A1C
Hgb A1c MFr Bld: 5.5 % (ref <5.7)
MEAN PLASMA GLUCOSE: 111 mg/dL

## 2017-01-30 LAB — TESTOSTERONE: Testosterone: 200 ng/dL — ABNORMAL LOW (ref 250–827)

## 2017-01-30 LAB — URINALYSIS, ROUTINE W REFLEX MICROSCOPIC
BILIRUBIN URINE: NEGATIVE
GLUCOSE, UA: NEGATIVE
HGB URINE DIPSTICK: NEGATIVE
KETONES UR: NEGATIVE
Leukocytes, UA: NEGATIVE
Nitrite: NEGATIVE
Specific Gravity, Urine: 1.029 (ref 1.001–1.035)
pH: 5 (ref 5.0–8.0)

## 2017-01-30 LAB — MICROALBUMIN / CREATININE URINE RATIO
Creatinine, Urine: 371 mg/dL — ABNORMAL HIGH (ref 20–370)
Microalb Creat Ratio: 29 mcg/mg creat (ref <30)
Microalb, Ur: 10.8 mg/dL

## 2017-01-30 LAB — URINALYSIS, MICROSCOPIC ONLY
Bacteria, UA: NONE SEEN [HPF]
Casts: NONE SEEN [LPF]
SQUAMOUS EPITHELIAL / LPF: NONE SEEN [HPF] (ref <=5)
WBC, UA: NONE SEEN WBC/HPF (ref <=5)
Yeast: NONE SEEN [HPF]

## 2017-01-30 LAB — PSA: PSA: 1.3 ng/mL (ref <=4.0)

## 2017-01-30 LAB — VITAMIN D 25 HYDROXY (VIT D DEFICIENCY, FRACTURES): Vit D, 25-Hydroxy: 67 ng/mL (ref 30–100)

## 2017-01-30 LAB — MAGNESIUM: MAGNESIUM: 2.2 mg/dL (ref 1.5–2.5)

## 2017-01-30 LAB — INSULIN, RANDOM: INSULIN: 8.5 u[IU]/mL (ref 2.0–19.6)

## 2017-01-30 LAB — VITAMIN B12: VITAMIN B 12: 465 pg/mL (ref 200–1100)

## 2017-02-03 ENCOUNTER — Encounter: Payer: Self-pay | Admitting: Internal Medicine

## 2017-02-04 LAB — TB SKIN TEST
Induration: 0 mm
TB Skin Test: NEGATIVE

## 2017-02-18 ENCOUNTER — Other Ambulatory Visit: Payer: Self-pay | Admitting: *Deleted

## 2017-02-18 DIAGNOSIS — Z1212 Encounter for screening for malignant neoplasm of rectum: Secondary | ICD-10-CM

## 2017-02-18 LAB — POC HEMOCCULT BLD/STL (HOME/3-CARD/SCREEN)
Card #3 Fecal Occult Blood, POC: NEGATIVE
FECAL OCCULT BLD: NEGATIVE
Fecal Occult Blood, POC: NEGATIVE

## 2017-03-11 ENCOUNTER — Other Ambulatory Visit: Payer: Self-pay | Admitting: Internal Medicine

## 2017-03-11 ENCOUNTER — Encounter: Payer: Self-pay | Admitting: Internal Medicine

## 2017-05-06 ENCOUNTER — Encounter: Payer: Self-pay | Admitting: Internal Medicine

## 2017-05-19 NOTE — Progress Notes (Signed)
Assessment and Plan:   Essential hypertension - continue medications, DASH diet, exercise and monitor at home. Call if greater than 130/80.  -     CBC with Differential/Platelet -     BASIC METABOLIC PANEL WITH GFR -     Hepatic function panel -     TSH  Hyperlipemia, mixed -continue medications, check lipids, decrease fatty foods, increase activity.  -     Lipid panel  Medication management -     Magnesium  OSA on CPAP Sleep apnea- continue CPAP, CPAP is helping with daytime fatigue, weight loss still advised.   Varicose veins of both lower extremities - weight loss discussed, continue compression stockings and elevation   Continue diet and meds as discussed. Further disposition pending results of labs. Future Appointments Date Time Provider [place]  08/25/2017 3:30 PM [name], MD [facility] None  02/25/2018 2:00 PM [name], MD [facility] None    HPI 56 y.o. male  presents for 3 month follow up with hypertension, hyperlipidemia, prediabetes and vitamin D.  His blood pressure has been controlled at home, today their BP is BP: 140/80  He does not workout. He denies chest pain, shortness of breath, dizziness.  He is on CPAP for OSA  He is on cholesterol medication, lipitor 80 take 3 a week and denies myalgias. His cholesterol is at goal. The cholesterol last visit was:   Lab Results  Component Value Date   CHOL 198 01/29/2017   HDL 50 01/29/2017   LDLCALC 90 01/29/2017   TRIG 289 (H) 01/29/2017   CHOLHDL 4.0 01/29/2017    He has been working on diet and exercise for prediabetes, and denies paresthesia of the feet, polydipsia, polyuria and visual disturbances. Last A1C in the office was:  Lab Results  Component Value Date   HGBA1C 5.5 01/29/2017  Patient is on Vitamin D supplement.   Lab Results  Component Value Date   VD25OH 67 01/29/2017   BMI is Body mass index is 27.25 kg/m., he is working on diet and exercise. He has bilateral  varicose veins, noticed  Wt Readings from Last 3 Encounters:  05/20/17 209 lb 6.4 oz (95 kg)  01/29/17 212 lb 6.4 oz (96.3 kg)  07/30/16 208 lb (94.3 kg)     Current Medications:  Current Outpatient Prescriptions on File Prior to Visit  Medication Sig Dispense Refill  . aspirin 81 MG tablet Take 81 mg by mouth daily.    Marland Kitchen atorvastatin (LIPITOR) 80 MG tablet TAKE 1 BY MOUTH DAILY OR AS DIRECTED FOR CHOLESTEROL 90 tablet 1  . bisoprolol-hydrochlorothiazide (ZIAC) 5-6.25 MG tablet Take 1 tablet by mouth daily. For BP 90 tablet 1  . Cholecalciferol (VITAMIN D-3 PO) Take 4,000 Units by mouth 2 (two) times daily.     . Cinnamon 500 MG capsule Take 500 mg by mouth 4 (four) times daily.    . clindamycin (CLINDAGEL) 1 % gel Apply topically 2 (two) times daily. 60 g 3  . fluticasone (FLONASE) 50 MCG/ACT nasal spray Place 2 sprays into both nostrils daily. 50 g 1  . Multiple Vitamin (MULTIVITAMIN) tablet Take 1 tablet by mouth daily.    . Omega-3 Fatty Acids (FISH OIL PO) Take 1,200 mg by mouth 2 (two) times daily.      No current facility-administered medications on file prior to visit.    Medical History:  Past Medical History:  Diagnosis Date  . Anxiety   . Hyperlipidemia   . Hypertension   .  Kidney stones   . OSA (obstructive sleep apnea)   . Other testicular hypofunction   . Unspecified vitamin D deficiency    Allergies: No Known Allergies   Review of Systems:  Review of Systems  Constitutional: Negative.   HENT: Negative.   Eyes: Negative.   Respiratory: Negative.   Cardiovascular: Negative.   Gastrointestinal: Negative.   Genitourinary: Negative.   Musculoskeletal: Negative.   Skin: Negative.   Neurological: Negative.   Endo/Heme/Allergies: Negative.   Psychiatric/Behavioral: Negative.     Family history- Review and unchanged Social history- Review and unchanged Physical Exam: BP 140/80   Pulse 75   Temp 97.7 F (36.5 C)   Resp 14   Ht 6' 1.5" (1.867 m)   Wt  209 lb 6.4 oz (95 kg)   SpO2 95%   BMI 27.25 kg/m  Wt Readings from Last 3 Encounters:  05/20/17 209 lb 6.4 oz (95 kg)  01/29/17 212 lb 6.4 oz (96.3 kg)  07/30/16 208 lb (94.3 kg)   General Appearance: Well nourished, in no apparent distress. Eyes: PERRLA, EOMs, conjunctiva no swelling or erythema Sinuses: No Frontal/maxillary tenderness ENT/Mouth: Ext aud canals clear, TMs without erythema, bulging. No erythema, swelling, or exudate on post pharynx.  Tonsils not swollen or erythematous. Hearing normal.  Neck: Supple, thyroid normal.  Respiratory: Respiratory effort normal, BS equal bilaterally without rales, rhonchi, wheezing or stridor.  Cardio: RRR with no MRGs. Brisk peripheral pulses without edema.  Abdomen: Soft, + BS,  Non tender, no guarding, rebound, hernias, masses. Lymphatics: Non tender without lymphadenopathy.  Musculoskeletal: Full ROM, 5/5 strength, Normal gait, + varicose veins bilateral legs, no warmth, redness, distal swelling, + hard cord medial left leg near knee.  Skin: Warm, dry without rashes, lesions, ecchymosis.  Neuro: Cranial nerves intact. Normal muscle tone, no cerebellar symptoms. Psych: Awake and oriented X 3, normal affect, Insight and Judgment appropriate.    Vicie Mutters, PA-C 3:41 PM [facility]

## 2017-05-20 ENCOUNTER — Encounter: Payer: Self-pay | Admitting: Physician Assistant

## 2017-05-20 ENCOUNTER — Ambulatory Visit (INDEPENDENT_AMBULATORY_CARE_PROVIDER_SITE_OTHER): Payer: BLUE CROSS/BLUE SHIELD | Admitting: Physician Assistant

## 2017-05-20 VITALS — BP 140/80 | HR 75 | Temp 97.7°F | Resp 14 | Ht 73.5 in | Wt 209.4 lb

## 2017-05-20 DIAGNOSIS — Z9989 Dependence on other enabling machines and devices: Secondary | ICD-10-CM

## 2017-05-20 DIAGNOSIS — I8393 Asymptomatic varicose veins of bilateral lower extremities: Secondary | ICD-10-CM | POA: Diagnosis not present

## 2017-05-20 DIAGNOSIS — G4733 Obstructive sleep apnea (adult) (pediatric): Secondary | ICD-10-CM | POA: Diagnosis not present

## 2017-05-20 DIAGNOSIS — I10 Essential (primary) hypertension: Secondary | ICD-10-CM | POA: Diagnosis not present

## 2017-05-20 DIAGNOSIS — Z79899 Other long term (current) drug therapy: Secondary | ICD-10-CM

## 2017-05-20 DIAGNOSIS — E782 Mixed hyperlipidemia: Secondary | ICD-10-CM | POA: Diagnosis not present

## 2017-05-20 LAB — CBC WITH DIFFERENTIAL/PLATELET
Basophils Absolute: 65 cells/uL (ref 0–200)
Basophils Relative: 1 %
EOS ABS: 65 {cells}/uL (ref 15–500)
Eosinophils Relative: 1 %
HCT: 41.9 % (ref 38.5–50.0)
Hemoglobin: 14.4 g/dL (ref 13.2–17.1)
LYMPHS PCT: 29 %
Lymphs Abs: 1885 cells/uL (ref 850–3900)
MCH: 31.2 pg (ref 27.0–33.0)
MCHC: 34.4 g/dL (ref 32.0–36.0)
MCV: 90.9 fL (ref 80.0–100.0)
MONO ABS: 585 {cells}/uL (ref 200–950)
MPV: 9.3 fL (ref 7.5–12.5)
Monocytes Relative: 9 %
NEUTROS PCT: 60 %
Neutro Abs: 3900 cells/uL (ref 1500–7800)
Platelets: 314 10*3/uL (ref 140–400)
RBC: 4.61 MIL/uL (ref 4.20–5.80)
RDW: 12.7 % (ref 11.0–15.0)
WBC: 6.5 10*3/uL (ref 3.8–10.8)

## 2017-05-20 LAB — TSH: TSH: 1.78 m[IU]/L (ref 0.40–4.50)

## 2017-05-20 NOTE — Patient Instructions (Addendum)
Varicose Veins Varicose veins are veins that have become enlarged and twisted. CAUSES This condition is the result of valves in the veins not working properly. Valves in the veins help return blood from the leg to the heart. When your calf muscles squeeze, the blood moves up your leg then the valves close and this continues until the blood gets back to your heart.  If these valves are damaged, blood flows backwards and backs up into the veins in the leg near the skin OR if your are sitting/standing for a long time without using your calf muscles the blood will back up into the veins in your legs. This causes the veins to become larger. People who are on their feet a lot, sit a lot without walking (like on a plane, at a desk, or in a car), who are pregnant, or who are overweight are more likely to develop varicose veins. SYMPTOMS   Bulging, twisted-appearing, bluish veins, most commonly found on the legs.  Leg pain or a feeling of heaviness. These symptoms may be worse at the end of the day.  Leg swelling.  Skin color changes. DIAGNOSIS  Varicose veins can usually be diagnosed with an exam of your legs by your caregiver. He or she may recommend an ultrasound of your leg veins. TREATMENT  Most varicose veins can be treated at home.However, other treatments are available for people who have persistent symptoms or who want to treat the cosmetic appearance of the varicose veins. But this is only cosmetic and they will return if not properly treated. These include:  Laser treatment of very small varicose veins.  Medicine that is shot (injected) into the vein. This medicine hardens the walls of the vein and closes off the vein. This treatment is called sclerotherapy. Afterwards, you may need to wear clothing or bandages that apply pressure.  Surgery. HOME CARE INSTRUCTIONS   Do not stand or sit in one position for long periods of time. Do not sit with your legs crossed. Rest with your legs raised  during the day.  Your legs have to be higher than your heart so that gravity will force the valves to open, so please really elevate your legs.   Wear elastic stockings or support hose. Do not wear other tight, encircling garments around the legs, pelvis, or waist.  [employer]  has a wide variety of well priced compression stockings. [place], [place] [address] [phone] [place] ARE COPPER INFUSED COMPRESSION SOCKS AT [facility] [place] OR [employer] or [employer]  Walk as much as possible to increase blood flow.  Raise the foot of your bed at night with 2-inch blocks.  If you get a cut in the skin over the vein and the vein bleeds, lie down with your leg raised and press on it with a clean cloth until the bleeding stops. Then place a bandage (dressing) on the cut. See your caregiver if it continues to bleed or needs stitches. SEEK MEDICAL CARE IF:   The skin around your ankle starts to break down.  You have pain, redness, tenderness, or hard swelling developing in your leg over a vein.  You are uncomfortable due to leg pain. Document Released: 07/10/2005 Document Revised: 12/23/2011 Document Reviewed: 11/26/2010 [facility] Patient Information 2014 [place].

## 2017-05-21 LAB — LIPID PANEL
Cholesterol: 153 mg/dL (ref <200)
HDL: 47 mg/dL (ref >40)
LDL CALC: 53 mg/dL (ref <100)
TRIGLYCERIDES: 267 mg/dL — AB (ref <150)
Total CHOL/HDL Ratio: 3.3 Ratio (ref <5.0)
VLDL: 53 mg/dL — ABNORMAL HIGH (ref <30)

## 2017-05-21 LAB — HEPATIC FUNCTION PANEL
ALBUMIN: 4.5 g/dL (ref 3.6–5.1)
ALK PHOS: 60 U/L (ref 40–115)
ALT: 23 U/L (ref 9–46)
AST: 22 U/L (ref 10–35)
Bilirubin, Direct: 0.1 mg/dL (ref <=0.2)
Indirect Bilirubin: 0.4 mg/dL (ref 0.2–1.2)
TOTAL PROTEIN: 6.7 g/dL (ref 6.1–8.1)
Total Bilirubin: 0.5 mg/dL (ref 0.2–1.2)

## 2017-05-21 LAB — BASIC METABOLIC PANEL WITH GFR
BUN: 15 mg/dL (ref 7–25)
CO2: 27 mmol/L (ref 20–32)
Calcium: 10 mg/dL (ref 8.6–10.3)
Chloride: 101 mmol/L (ref 98–110)
Creat: 1.1 mg/dL (ref 0.70–1.33)
GFR, EST NON AFRICAN AMERICAN: 74 mL/min (ref >=60)
GFR, Est African American: 86 mL/min (ref >=60)
GLUCOSE: 102 mg/dL — AB (ref 65–99)
POTASSIUM: 4.1 mmol/L (ref 3.5–5.3)
SODIUM: 141 mmol/L (ref 135–146)

## 2017-05-21 LAB — MAGNESIUM: Magnesium: 2 mg/dL (ref 1.5–2.5)

## 2017-08-25 ENCOUNTER — Ambulatory Visit: Payer: Self-pay | Admitting: Internal Medicine

## 2017-09-02 NOTE — Progress Notes (Signed)
FOLLOW UP  Assessment and Plan:   Hypertension Well controlled with current medications  Monitor blood pressure at home; patient to call if consistently greater than 130/80 Continue DASH diet.   Reminder to go to the ER if any CP, SOB, nausea, dizziness, severe HA, changes vision/speech, left arm numbness and tingling and jaw pain.  Cholesterol Continue medication  Continue low cholesterol diet and exercise.  Check lipid panel.   Prediabetes Currently well controlled by lifestyle; last few A1Cs in normal range Continue diet and exercise.  Perform daily foot/skin check, notify office of any concerning changes.  Check A1C  Overweight Long discussion about weight loss, diet, and exercise Discussed ideal weight for height   Patient will work on portion control, increasing vegetables Will follow up in 3 months  Vitamin D Def/ osteoporosis prevention Continue supplementation Check Vit D level  Testosterone deficiency Continue zinc 50 mg supplement  Weight loss/exercise recommended Check level today to see if improved with zinc  Continue diet and meds as discussed. Further disposition pending results of labs. Discussed med's effects and SE's.   Over 30 minutes of exam, counseling, chart review, and critical decision making was performed.   Future Appointments  Date Time Provider [place]  02/25/2018  2:00 PM [name], MD [facility] None    ----------------------------------------------------------------------------------------------------------------------  HPI 56 y.o. male  presents for 3 month follow up on hypertension, cholesterol, prediabetes, testosterone deficiency, weight and vitamin D deficiency. He did start on zinc 50 mg supplement and has been taking daily since August.   BMI is Body mass index is 27.46 kg/m., he has been working on diet and exercise. Wt Readings from Last 3 Encounters:  09/03/17 211 lb (95.7 kg)  05/20/17 209 lb 6.4 oz (95  kg)  01/29/17 212 lb 6.4 oz (96.3 kg)   His blood pressure has been controlled at home, today their BP is BP: 128/74  He does not workout. He denies chest pain, shortness of breath, dizziness.   He is on cholesterol medication and denies myalgias. His cholesterol is not at goal. The cholesterol last visit was:   Lab Results  Component Value Date   CHOL 153 05/20/2017   HDL 47 05/20/2017   LDLCALC 53 05/20/2017   TRIG 267 (H) 05/20/2017   CHOLHDL 3.3 05/20/2017    He has been working on diet and exercise for prediabetes, and denies increased appetite, nausea, paresthesia of the feet, polydipsia, polyuria and visual disturbances. Last A1C in the office was:  Lab Results  Component Value Date   HGBA1C 5.5 01/29/2017   He has a history of testosterone deficiency and was recommended he start zinc 50 mg daily supplement. Lab Results  Component Value Date   TESTOSTERONE 200 (L) 01/29/2017   Patient is on Vitamin D supplement and near goal:    Lab Results  Component Value Date   VD25OH 67 01/29/2017       Current Medications:  Current Outpatient Medications on File Prior to Visit  Medication Sig  . aspirin 81 MG tablet Take 81 mg by mouth daily.  Marland Kitchen atorvastatin (LIPITOR) 80 MG tablet TAKE 1 BY MOUTH DAILY OR AS DIRECTED FOR CHOLESTEROL  . bisoprolol-hydrochlorothiazide (ZIAC) 5-6.25 MG tablet Take 1 tablet by mouth daily. For BP  . Cholecalciferol (VITAMIN D-3 PO) Take 4,000 Units by mouth 2 (two) times daily.   . Cinnamon 500 MG capsule Take 500 mg by mouth 4 (four) times daily.  . clindamycin (CLINDAGEL) 1 % gel Apply topically  2 (two) times daily.  . fluticasone (FLONASE) 50 MCG/ACT nasal spray Place 2 sprays into both nostrils daily.  . Multiple Vitamin (MULTIVITAMIN) tablet Take 1 tablet by mouth daily.  . Omega-3 Fatty Acids (FISH OIL PO) Take 1,200 mg by mouth 2 (two) times daily.    No current facility-administered medications on file prior to visit.      Allergies:  No Known Allergies   Medical History:  Past Medical History:  Diagnosis Date  . Anxiety   . Hyperlipidemia   . Hypertension   . Kidney stones   . OSA (obstructive sleep apnea)   . Other testicular hypofunction   . Unspecified vitamin D deficiency    Family history- Reviewed and unchanged Social history- Reviewed and unchanged   Review of Systems:  Review of Systems  Constitutional: Negative for malaise/fatigue and weight loss.  HENT: Negative for hearing loss and tinnitus.   Eyes: Negative for blurred vision and double vision.  Respiratory: Negative for cough, shortness of breath and wheezing.   Cardiovascular: Negative for chest pain, palpitations, orthopnea, claudication and leg swelling.  Gastrointestinal: Negative for abdominal pain, blood in stool, constipation, diarrhea, heartburn, melena, nausea and vomiting.  Genitourinary: Negative.   Musculoskeletal: Negative for joint pain and myalgias.  Skin: Negative for rash.  Neurological: Negative for dizziness, tingling, sensory change, weakness and headaches.  Endo/Heme/Allergies: Negative for polydipsia.  Psychiatric/Behavioral: Negative.   All other systems reviewed and are negative.     Physical Exam: BP 128/74   Pulse 61   Temp (!) 97.5 F (36.4 C)   Ht 6' 1.5" (1.867 m)   Wt 211 lb (95.7 kg)   SpO2 96%   BMI 27.46 kg/m  Wt Readings from Last 3 Encounters:  09/03/17 211 lb (95.7 kg)  05/20/17 209 lb 6.4 oz (95 kg)  01/29/17 212 lb 6.4 oz (96.3 kg)   General Appearance: Well nourished, in no apparent distress. Eyes: PERRLA, EOMs, conjunctiva no swelling or erythema Sinuses: No Frontal/maxillary tenderness ENT/Mouth: Ext aud canals clear, TMs without erythema, bulging. No erythema, swelling, or exudate on post pharynx.  Tonsils not swollen or erythematous. Hearing normal.  Neck: Supple, thyroid normal.  Respiratory: Respiratory effort normal, BS equal bilaterally without rales, rhonchi, wheezing or stridor.   Cardio: RRR with no MRGs. Brisk peripheral pulses without edema.  Abdomen: Soft, + BS.  Non tender, no guarding, rebound, hernias, masses. Lymphatics: Non tender without lymphadenopathy.  Musculoskeletal: Full ROM, 5/5 strength, Normal gait Skin: Warm, dry without rashes, lesions, ecchymosis.  Neuro: Cranial nerves intact. No cerebellar symptoms.  Psych: Awake and oriented X 3, normal affect, Insight and Judgment appropriate.    Izora Ribas, NP 2:48 PM [facility]

## 2017-09-03 ENCOUNTER — Encounter: Payer: Self-pay | Admitting: Adult Health

## 2017-09-03 ENCOUNTER — Ambulatory Visit: Payer: BLUE CROSS/BLUE SHIELD | Admitting: Adult Health

## 2017-09-03 VITALS — BP 128/74 | HR 61 | Temp 97.5°F | Ht 73.5 in | Wt 211.0 lb

## 2017-09-03 DIAGNOSIS — E782 Mixed hyperlipidemia: Secondary | ICD-10-CM | POA: Diagnosis not present

## 2017-09-03 DIAGNOSIS — R7303 Prediabetes: Secondary | ICD-10-CM | POA: Diagnosis not present

## 2017-09-03 DIAGNOSIS — E663 Overweight: Secondary | ICD-10-CM

## 2017-09-03 DIAGNOSIS — Z23 Encounter for immunization: Secondary | ICD-10-CM

## 2017-09-03 DIAGNOSIS — I10 Essential (primary) hypertension: Secondary | ICD-10-CM | POA: Diagnosis not present

## 2017-09-03 DIAGNOSIS — E559 Vitamin D deficiency, unspecified: Secondary | ICD-10-CM | POA: Diagnosis not present

## 2017-09-03 DIAGNOSIS — E349 Endocrine disorder, unspecified: Secondary | ICD-10-CM | POA: Diagnosis not present

## 2017-09-03 DIAGNOSIS — Z79899 Other long term (current) drug therapy: Secondary | ICD-10-CM

## 2017-09-03 NOTE — Patient Instructions (Addendum)
  [employer] with no obligation # [phone] Do not have to be a member Tues-Sat 10-6  [place]- free test with no obligation # [phone] MUST BE A MEMBER Call for store hours  Here is some information to help you keep your heart healthy: Move it! - Aim for 30 mins of activity every day. Take it slowly at first. Talk to [place] before starting any new exercise program.   Lose it.  -Body Mass Index (BMI) can indicate if you need to lose weight. A healthy range is 18.5-24.9. For a BMI calculator, go to [employer]  Waist Management -Excess abdominal fat is a risk factor for heart disease, diabetes, asthma, stroke and more. Ideal waist circumference is less than 35" for women and less than 40" for men.   Eat Right -focus on fruits, vegetables, whole grains, and meals you make yourself. Avoid foods with trans fat and high sugar/sodium content.   Snooze or Snore? - Loud snoring can be a sign of sleep apnea, a significant risk factor for high blood pressure, heart attach, stroke, and heart arrhythmias.  Kick the habit -Quit Smoking! Avoid second hand smoke. A single cigarette raises your blood pressure for 20 mins and increases the risk of heart attack and stroke for the next 24 hours.   Are Aspirin and Supplements right for you? -Add ENTERIC COATED low dose 81 mg Aspirin daily OR can do every other day if you have easy bruising to protect your heart and head. As well as to reduce risk of Colon Cancer by 20 %, Skin Cancer by 26 % , Melanoma by 46% and Pancreatic cancer by 60%  Say "No to Stress -There may be little you can do about problems that cause stress. However, techniques such as long walks, meditation, and exercise can help you manage it.   Start Now! - Make changes one at a time and set reasonable goals to increase your likelihood of success.     Have had patient's get good cheaper hearing aids from [employer] The air version has good  reviews.

## 2017-09-04 LAB — CBC WITH DIFFERENTIAL/PLATELET
BASOS ABS: 83 {cells}/uL (ref 0–200)
Basophils Relative: 1 %
EOS ABS: 91 {cells}/uL (ref 15–500)
Eosinophils Relative: 1.1 %
HEMATOCRIT: 42.4 % (ref 38.5–50.0)
HEMOGLOBIN: 14.7 g/dL (ref 13.2–17.1)
LYMPHS ABS: 1909 {cells}/uL (ref 850–3900)
MCH: 30.7 pg (ref 27.0–33.0)
MCHC: 34.7 g/dL (ref 32.0–36.0)
MCV: 88.5 fL (ref 80.0–100.0)
MPV: 9.5 fL (ref 7.5–12.5)
Monocytes Relative: 9.4 %
NEUTROS ABS: 5437 {cells}/uL (ref 1500–7800)
NEUTROS PCT: 65.5 %
PLATELETS: 337 10*3/uL (ref 140–400)
RBC: 4.79 10*6/uL (ref 4.20–5.80)
RDW: 11.9 % (ref 11.0–15.0)
Total Lymphocyte: 23 %
WBC: 8.3 10*3/uL (ref 3.8–10.8)
WBCMIX: 780 {cells}/uL (ref 200–950)

## 2017-09-04 LAB — HEPATIC FUNCTION PANEL
AG RATIO: 1.9 (calc) (ref 1.0–2.5)
ALBUMIN MSPROF: 4.5 g/dL (ref 3.6–5.1)
ALKALINE PHOSPHATASE (APISO): 56 U/L (ref 40–115)
ALT: 22 U/L (ref 9–46)
AST: 20 U/L (ref 10–35)
BILIRUBIN DIRECT: 0.1 mg/dL (ref 0.0–0.2)
BILIRUBIN INDIRECT: 0.5 mg/dL (ref 0.2–1.2)
BILIRUBIN TOTAL: 0.6 mg/dL (ref 0.2–1.2)
GLOBULIN: 2.4 g/dL (ref 1.9–3.7)
Total Protein: 6.9 g/dL (ref 6.1–8.1)

## 2017-09-04 LAB — LIPID PANEL
CHOL/HDL RATIO: 3.2 (calc) (ref <5.0)
CHOLESTEROL: 170 mg/dL (ref <200)
HDL: 53 mg/dL (ref >40)
LDL Cholesterol (Calc): 90 mg/dL (calc)
Non-HDL Cholesterol (Calc): 117 mg/dL (calc) (ref <130)
Triglycerides: 170 mg/dL — ABNORMAL HIGH (ref <150)

## 2017-09-04 LAB — BASIC METABOLIC PANEL WITH GFR
BUN: 14 mg/dL (ref 7–25)
CALCIUM: 9.9 mg/dL (ref 8.6–10.3)
CHLORIDE: 102 mmol/L (ref 98–110)
CO2: 31 mmol/L (ref 20–32)
Creat: 0.92 mg/dL (ref 0.70–1.33)
GFR, Est African American: 106 mL/min/{1.73_m2} (ref >=60)
GFR, Est Non African American: 91 mL/min/{1.73_m2} (ref >=60)
GLUCOSE: 96 mg/dL (ref 65–99)
Potassium: 4.2 mmol/L (ref 3.5–5.3)
Sodium: 141 mmol/L (ref 135–146)

## 2017-09-04 LAB — VITAMIN D 25 HYDROXY (VIT D DEFICIENCY, FRACTURES): Vit D, 25-Hydroxy: 62 ng/mL (ref 30–100)

## 2017-09-04 LAB — HEMOGLOBIN A1C
HEMOGLOBIN A1C: 5.9 %{Hb} — AB (ref <5.7)
Mean Plasma Glucose: 123 (calc)
eAG (mmol/L): 6.8 (calc)

## 2017-09-04 LAB — TSH: TSH: 2.09 mIU/L (ref 0.40–4.50)

## 2017-09-04 LAB — TESTOSTERONE: Testosterone: 206 ng/dL — ABNORMAL LOW (ref 250–827)

## 2017-09-25 ENCOUNTER — Encounter: Payer: Self-pay | Admitting: Adult Health

## 2017-11-09 ENCOUNTER — Encounter: Payer: Self-pay | Admitting: Adult Health

## 2017-11-10 ENCOUNTER — Other Ambulatory Visit: Payer: Self-pay

## 2017-11-10 ENCOUNTER — Other Ambulatory Visit: Payer: Self-pay | Admitting: Adult Health

## 2017-11-10 MED ORDER — BISOPROLOL-HYDROCHLOROTHIAZIDE 5-6.25 MG PO TABS: 1.0000 | ORAL_TABLET | Freq: Every day | ORAL | 1 refills | Status: DC

## 2017-11-18 ENCOUNTER — Other Ambulatory Visit: Payer: Self-pay

## 2017-11-18 ENCOUNTER — Encounter: Payer: Self-pay | Admitting: Adult Health

## 2017-11-18 MED ORDER — BISOPROLOL-HYDROCHLOROTHIAZIDE 5-6.25 MG PO TABS: 1.0000 | ORAL_TABLET | Freq: Every day | ORAL | 1 refills | Status: DC

## 2017-11-21 ENCOUNTER — Encounter: Payer: Self-pay | Admitting: Adult Health

## 2017-12-11 ENCOUNTER — Ambulatory Visit: Payer: Self-pay | Admitting: Adult Health

## 2017-12-17 NOTE — Progress Notes (Signed)
FOLLOW UP  Assessment and Plan:   Hypertension Well controlled with current medications  Monitor blood pressure at home; patient to call if consistently greater than 130/80 Continue DASH diet.   Reminder to go to the ER if any CP, SOB, nausea, dizziness, severe HA, changes vision/speech, left arm numbness and tingling and jaw pain.  Cholesterol Currently at LDL goal; continue statin; diet for triglycerides discussed Continue low cholesterol diet and exercise.  Check lipid panel.   Prediabetes Continue diet and exercise.  Perform daily foot/skin check, notify office of any concerning changes.  Check A1C  Overweight with co morbidities Long discussion about weight loss, diet, and exercise Recommended diet heavy in fruits and veggies and low in animal meats, cheeses, and dairy products, appropriate calorie intake Discussed ideal weight for height and initial weight goal (205 lb) Patient will work on walking more, watching diet Will follow up in 3 months  Vitamin D Def At goal at last visit; continue supplementation to maintain goal of 70-100 Defer Vit D level  Continue diet and meds as discussed. Further disposition pending results of labs. Discussed med's effects and SE's.   Over 30 minutes of exam, counseling, chart review, and critical decision making was performed.   Future Appointments  Date Time Provider [place]  02/25/2018  2:00 PM [name], MD [facility] None    ----------------------------------------------------------------------------------------------------------------------  HPI 57 y.o. male  presents for 3 month follow up on hypertension, cholesterol, prediabetes, weight and vitamin D deficiency.   BMI is Body mass index is 27.46 kg/m., he has not been working on diet and exercise. Wants to restart with improved weather.  Wt Readings from Last 3 Encounters:  12/18/17 211 lb (95.7 kg)  09/03/17 211 lb (95.7 kg)  05/20/17 209 lb 6.4 oz (95  kg)   His blood pressure has been controlled at home, today their BP is BP: 136/80  He does not workout. He denies chest pain, shortness of breath, dizziness.   He is on cholesterol medication (atorvastatin 80 mg T,Th,Sat)and denies myalgias. His LDL cholesterol is at goal; trigs were elevated at last check. The cholesterol last visit was:   Lab Results  Component Value Date   CHOL 170 09/03/2017   HDL 53 09/03/2017   LDLCALC 53 05/20/2017   TRIG 170 (H) 09/03/2017   CHOLHDL 3.2 09/03/2017    He has not been working on diet and exercise for prediabetes, and denies foot ulcerations, hypoglycemia , increased appetite, nausea, paresthesia of the feet, polydipsia, polyuria, visual disturbances, vomiting and weight loss. Last A1C in the office was:  Lab Results  Component Value Date   HGBA1C 5.9 (H) 09/03/2017   Patient is on Vitamin D supplement and near goal at last check:   Lab Results  Component Value Date   VD25OH 62 09/03/2017        Current Medications:  Current Outpatient Medications on File Prior to Visit  Medication Sig  . aspirin 81 MG tablet Take 81 mg by mouth daily.  Marland Kitchen atorvastatin (LIPITOR) 80 MG tablet TAKE 1 BY MOUTH DAILY OR AS DIRECTED FOR CHOLESTEROL  . bisoprolol-hydrochlorothiazide (ZIAC) 5-6.25 MG tablet Take 1 tablet by mouth daily. For BP  . Cholecalciferol (VITAMIN D-3 PO) Take 4,000 Units by mouth 2 (two) times daily.   . Cinnamon 500 MG capsule Take 500 mg by mouth 4 (four) times daily.  . clindamycin (CLINDAGEL) 1 % gel Apply topically 2 (two) times daily.  . fluticasone (FLONASE) 50 MCG/ACT nasal spray  Place 2 sprays into both nostrils daily.  . Multiple Vitamin (MULTIVITAMIN) tablet Take 1 tablet by mouth daily.  . Omega-3 Fatty Acids (FISH OIL PO) Take 1,200 mg by mouth 2 (two) times daily.   Marland Kitchen zinc gluconate 50 MG tablet Take 50 mg by mouth daily.   No current facility-administered medications on file prior to visit.      Allergies: No Known  Allergies   Medical History:  Past Medical History:  Diagnosis Date  . Anxiety   . Hyperlipidemia   . Hypertension   . Kidney stones   . OSA (obstructive sleep apnea)   . Other testicular hypofunction   . Unspecified vitamin D deficiency    Family history- Reviewed and unchanged Social history- Reviewed and unchanged   Review of Systems:  Review of Systems  Constitutional: Negative for malaise/fatigue and weight loss.  HENT: Negative for hearing loss and tinnitus.   Eyes: Negative for blurred vision and double vision.  Respiratory: Negative for cough, shortness of breath and wheezing.   Cardiovascular: Negative for chest pain, palpitations, orthopnea, claudication and leg swelling.  Gastrointestinal: Negative for abdominal pain, blood in stool, constipation, diarrhea, heartburn, melena, nausea and vomiting.  Genitourinary: Negative.   Musculoskeletal: Negative for joint pain and myalgias.  Skin: Negative for rash.  Neurological: Negative for dizziness, tingling, sensory change, weakness and headaches.  Endo/Heme/Allergies: Negative for polydipsia.  Psychiatric/Behavioral: Negative.   All other systems reviewed and are negative.   Physical Exam: BP 136/80   Pulse (!) 58   Temp 97.7 F (36.5 C)   Ht 6' 1.5" (1.867 m)   Wt 211 lb (95.7 kg)   SpO2 97%   BMI 27.46 kg/m  Wt Readings from Last 3 Encounters:  12/18/17 211 lb (95.7 kg)  09/03/17 211 lb (95.7 kg)  05/20/17 209 lb 6.4 oz (95 kg)   General Appearance: Well nourished, in no apparent distress. Eyes: PERRLA, EOMs, conjunctiva no swelling or erythema Sinuses: No Frontal/maxillary tenderness ENT/Mouth: Ext aud canals clear, TMs without erythema, bulging. No erythema, swelling, or exudate on post pharynx.  Tonsils not swollen or erythematous. Hearing normal.  Neck: Supple, thyroid normal.  Respiratory: Respiratory effort normal, BS equal bilaterally without rales, rhonchi, wheezing or stridor.  Cardio: RRR with  no MRGs. Brisk peripheral pulses without edema.  Abdomen: Soft, + BS.  Non tender, no guarding, rebound, hernias, masses. Lymphatics: Non tender without lymphadenopathy.  Musculoskeletal: Full ROM, 5/5 strength, Normal gait Skin: Warm, dry without rashes, lesions, ecchymosis.  Neuro: Cranial nerves intact. No cerebellar symptoms.  Psych: Awake and oriented X 3, normal affect, Insight and Judgment appropriate.    Tony Ribas, Tony Bailey 4:13 PM [facility]

## 2017-12-18 ENCOUNTER — Ambulatory Visit: Payer: Self-pay | Admitting: Adult Health

## 2017-12-18 ENCOUNTER — Ambulatory Visit: Payer: BLUE CROSS/BLUE SHIELD | Admitting: Adult Health

## 2017-12-18 ENCOUNTER — Encounter: Payer: Self-pay | Admitting: Adult Health

## 2017-12-18 VITALS — BP 136/80 | HR 58 | Temp 97.7°F | Ht 73.5 in | Wt 211.0 lb

## 2017-12-18 DIAGNOSIS — E559 Vitamin D deficiency, unspecified: Secondary | ICD-10-CM | POA: Diagnosis not present

## 2017-12-18 DIAGNOSIS — Z79899 Other long term (current) drug therapy: Secondary | ICD-10-CM | POA: Diagnosis not present

## 2017-12-18 DIAGNOSIS — R7303 Prediabetes: Secondary | ICD-10-CM

## 2017-12-18 DIAGNOSIS — E782 Mixed hyperlipidemia: Secondary | ICD-10-CM | POA: Diagnosis not present

## 2017-12-18 DIAGNOSIS — I10 Essential (primary) hypertension: Secondary | ICD-10-CM | POA: Diagnosis not present

## 2017-12-18 DIAGNOSIS — E663 Overweight: Secondary | ICD-10-CM | POA: Diagnosis not present

## 2017-12-18 NOTE — Patient Instructions (Addendum)
  Aim for 7+ servings of fruits and vegetables daily  80+ fluid ounces of water or unsweet tea for healthy kidneys  Limit alcohol  Limit animal fats in diet for cholesterol and heart health - choose grass fed whenever available  Aim for low stress - take time to unwind and care for your mental health  Aim for 150 min of moderate intensity exercise weekly for heart health, and weights twice weekly for bone health  Aim for 7-9 hours of sleep daily      When it comes to diets, agreement about the perfect plan isn't easy to find, even among the experts. Experts at the [employer] developed an idea known as the Healthy Eating Plate. Just imagine a plate divided into logical, healthy portions.  The emphasis is on diet quality:  Load up on vegetables and fruits - one-half of your plate: Aim for color and variety, and remember that potatoes don't count.  Go for whole grains - one-quarter of your plate: Whole wheat, barley, wheat berries, quinoa, oats, brown rice, and foods made with them. If you want pasta, go with whole wheat pasta.  Protein power - one-quarter of your plate: Fish, chicken, beans, and nuts are all healthy, versatile protein sources. Limit red meat.  The diet, however, does go beyond the plate, offering a few other suggestions.  Use healthy plant oils, such as olive, canola, soy, corn, sunflower and peanut. Check the labels, and avoid partially hydrogenated oil, which have unhealthy trans fats.  If you're thirsty, drink water. Coffee and tea are good in moderation, but skip sugary drinks and limit milk and dairy products to one or two daily servings.  The type of carbohydrate in the diet is more important than the amount. Some sources of carbohydrates, such as vegetables, fruits, whole grains, and beans-are healthier than others.  Finally, stay active.

## 2017-12-19 LAB — CBC WITH DIFFERENTIAL/PLATELET
Basophils Absolute: 74 cells/uL (ref 0–200)
Basophils Relative: 1.1 %
EOS ABS: 94 {cells}/uL (ref 15–500)
Eosinophils Relative: 1.4 %
HCT: 43.5 % (ref 38.5–50.0)
Hemoglobin: 15.1 g/dL (ref 13.2–17.1)
Lymphs Abs: 1970 cells/uL (ref 850–3900)
MCH: 30.6 pg (ref 27.0–33.0)
MCHC: 34.7 g/dL (ref 32.0–36.0)
MCV: 88.1 fL (ref 80.0–100.0)
MPV: 9.3 fL (ref 7.5–12.5)
Monocytes Relative: 8.6 %
NEUTROS PCT: 59.5 %
Neutro Abs: 3987 cells/uL (ref 1500–7800)
PLATELETS: 351 10*3/uL (ref 140–400)
RBC: 4.94 10*6/uL (ref 4.20–5.80)
RDW: 11.9 % (ref 11.0–15.0)
TOTAL LYMPHOCYTE: 29.4 %
WBC: 6.7 10*3/uL (ref 3.8–10.8)
WBCMIX: 576 {cells}/uL (ref 200–950)

## 2017-12-19 LAB — BASIC METABOLIC PANEL WITH GFR
BUN: 21 mg/dL (ref 7–25)
CHLORIDE: 100 mmol/L (ref 98–110)
CO2: 31 mmol/L (ref 20–32)
Calcium: 10.2 mg/dL (ref 8.6–10.3)
Creat: 1.09 mg/dL (ref 0.70–1.33)
GFR, EST AFRICAN AMERICAN: 86 mL/min/{1.73_m2} (ref >=60)
GFR, Est Non African American: 74 mL/min/{1.73_m2} (ref >=60)
Glucose, Bld: 94 mg/dL (ref 65–99)
POTASSIUM: 4.9 mmol/L (ref 3.5–5.3)
Sodium: 140 mmol/L (ref 135–146)

## 2017-12-19 LAB — LIPID PANEL
CHOLESTEROL: 185 mg/dL (ref <200)
HDL: 41 mg/dL (ref >40)
LDL Cholesterol (Calc): 107 mg/dL (calc) — ABNORMAL HIGH
NON-HDL CHOLESTEROL (CALC): 144 mg/dL — AB (ref <130)
Total CHOL/HDL Ratio: 4.5 (calc) (ref <5.0)
Triglycerides: 245 mg/dL — ABNORMAL HIGH (ref <150)

## 2017-12-19 LAB — HEPATIC FUNCTION PANEL
AG Ratio: 2 (calc) (ref 1.0–2.5)
ALT: 35 U/L (ref 9–46)
AST: 27 U/L (ref 10–35)
Albumin: 4.9 g/dL (ref 3.6–5.1)
Alkaline phosphatase (APISO): 54 U/L (ref 40–115)
BILIRUBIN DIRECT: 0.1 mg/dL (ref 0.0–0.2)
BILIRUBIN INDIRECT: 0.5 mg/dL (ref 0.2–1.2)
BILIRUBIN TOTAL: 0.6 mg/dL (ref 0.2–1.2)
GLOBULIN: 2.5 g/dL (ref 1.9–3.7)
Total Protein: 7.4 g/dL (ref 6.1–8.1)

## 2017-12-19 LAB — HEMOGLOBIN A1C
HEMOGLOBIN A1C: 5.9 %{Hb} — AB (ref <5.7)
MEAN PLASMA GLUCOSE: 123 (calc)
eAG (mmol/L): 6.8 (calc)

## 2017-12-19 LAB — TSH: TSH: 2.54 m[IU]/L (ref 0.40–4.50)

## 2018-01-12 ENCOUNTER — Other Ambulatory Visit: Payer: Self-pay | Admitting: Adult Health

## 2018-01-27 ENCOUNTER — Other Ambulatory Visit: Payer: Self-pay | Admitting: Adult Health

## 2018-01-27 ENCOUNTER — Encounter: Payer: Self-pay | Admitting: Adult Health

## 2018-01-28 ENCOUNTER — Other Ambulatory Visit: Payer: Self-pay

## 2018-01-28 MED ORDER — BISOPROLOL-HYDROCHLOROTHIAZIDE 5-6.25 MG PO TABS: 1.0000 | ORAL_TABLET | Freq: Every day | ORAL | 1 refills | Status: DC

## 2018-02-06 ENCOUNTER — Encounter: Payer: Self-pay | Admitting: Adult Health

## 2018-02-25 ENCOUNTER — Encounter: Payer: Self-pay | Admitting: Internal Medicine

## 2018-03-10 ENCOUNTER — Other Ambulatory Visit: Payer: Self-pay | Admitting: Internal Medicine

## 2018-03-10 DIAGNOSIS — E782 Mixed hyperlipidemia: Secondary | ICD-10-CM

## 2018-03-10 MED ORDER — ATORVASTATIN CALCIUM 80 MG PO TABS: ORAL_TABLET | ORAL | 0 refills | Status: DC

## 2018-03-13 ENCOUNTER — Other Ambulatory Visit: Payer: Self-pay | Admitting: Adult Health

## 2018-03-13 ENCOUNTER — Encounter: Payer: Self-pay | Admitting: Adult Health

## 2018-03-13 MED ORDER — ATORVASTATIN CALCIUM 80 MG PO TABS: 80.0000 mg | ORAL_TABLET | Freq: Every day | ORAL | 0 refills | Status: DC

## 2018-03-24 ENCOUNTER — Encounter: Payer: Self-pay | Admitting: Internal Medicine

## 2018-04-09 ENCOUNTER — Encounter: Payer: Self-pay | Admitting: Internal Medicine

## 2018-04-09 ENCOUNTER — Ambulatory Visit (INDEPENDENT_AMBULATORY_CARE_PROVIDER_SITE_OTHER): Payer: BLUE CROSS/BLUE SHIELD | Admitting: Internal Medicine

## 2018-04-09 VITALS — BP 132/82 | HR 68 | Temp 97.5°F | Resp 16 | Ht 72.5 in | Wt 210.6 lb

## 2018-04-09 DIAGNOSIS — Z1212 Encounter for screening for malignant neoplasm of rectum: Secondary | ICD-10-CM

## 2018-04-09 DIAGNOSIS — Z79899 Other long term (current) drug therapy: Secondary | ICD-10-CM

## 2018-04-09 DIAGNOSIS — Z9989 Dependence on other enabling machines and devices: Secondary | ICD-10-CM

## 2018-04-09 DIAGNOSIS — Z0001 Encounter for general adult medical examination with abnormal findings: Secondary | ICD-10-CM | POA: Diagnosis not present

## 2018-04-09 DIAGNOSIS — R7303 Prediabetes: Secondary | ICD-10-CM

## 2018-04-09 DIAGNOSIS — I10 Essential (primary) hypertension: Secondary | ICD-10-CM | POA: Diagnosis not present

## 2018-04-09 DIAGNOSIS — Z125 Encounter for screening for malignant neoplasm of prostate: Secondary | ICD-10-CM | POA: Diagnosis not present

## 2018-04-09 DIAGNOSIS — E349 Endocrine disorder, unspecified: Secondary | ICD-10-CM

## 2018-04-09 DIAGNOSIS — E559 Vitamin D deficiency, unspecified: Secondary | ICD-10-CM | POA: Diagnosis not present

## 2018-04-09 DIAGNOSIS — Z8249 Family history of ischemic heart disease and other diseases of the circulatory system: Secondary | ICD-10-CM | POA: Diagnosis not present

## 2018-04-09 DIAGNOSIS — Z136 Encounter for screening for cardiovascular disorders: Secondary | ICD-10-CM | POA: Diagnosis not present

## 2018-04-09 DIAGNOSIS — G4733 Obstructive sleep apnea (adult) (pediatric): Secondary | ICD-10-CM

## 2018-04-09 DIAGNOSIS — E782 Mixed hyperlipidemia: Secondary | ICD-10-CM

## 2018-04-09 DIAGNOSIS — Z111 Encounter for screening for respiratory tuberculosis: Secondary | ICD-10-CM

## 2018-04-09 DIAGNOSIS — R5383 Other fatigue: Secondary | ICD-10-CM | POA: Diagnosis not present

## 2018-04-09 DIAGNOSIS — Z1211 Encounter for screening for malignant neoplasm of colon: Secondary | ICD-10-CM

## 2018-04-09 DIAGNOSIS — J3489 Other specified disorders of nose and nasal sinuses: Secondary | ICD-10-CM

## 2018-04-09 NOTE — Progress Notes (Signed)
[facility] [name], M.D.     [facility]. [facility], P.A.-C [facility], [place] [address] [place], [place] [address] Telephone [phone] Telefax [phone] Annual Screening/Preventative Visit & Comprehensive Evaluation &  Examination     This very nice 57 y.o. MWM presents for a Screening /Preventative Visit & comprehensive evaluation and management of multiple medical co-morbidities.  Patient has been followed for HTN, HLD, \Prediabetes  and Vitamin D Deficiency.     Patient relates hx/o nasal SMR surg in 1981 and  more recently is having intermittent difficulty breathing thru the Lt nostril and occas bleeding and scabbing and desires ENT evaluation.       HTN predates circa 2014. Patient's BP has been controlled at home and patient denies any cardiac symptoms as chest pain, palpitations, shortness of breath, dizziness or ankle swelling. Today's BP is at goal - 132/82.      Patient's hyperlipidemia is controlled with diet and medications. Patient denies myalgias or other medication SE's. Last lipids were not at goal: Lab Results  Component Value Date   CHOL 185 12/18/2017   HDL 41 12/18/2017   LDLCALC 107 (H) 12/18/2017   TRIG 245 (H) 12/18/2017   CHOLHDL 4.5 12/18/2017      Patient has prediabetes (A1c5.8%/2014) and patient denies reactive hypoglycemic symptoms, visual blurring, diabetic polys, or paresthesias. Last A1c was not at goal: Lab Results  Component Value Date   HGBA1C 5.9 (H) 12/18/2017      Finally, patient has history of Vitamin D Deficiency ("47"/2014) and last Vitamin D was at goal:  Lab Results  Component Value Date   VD25OH 62 09/03/2017   Current Outpatient Medications on File Prior to Visit  Medication Sig  . aspirin 81 MG tablet Take 81 mg by mouth daily.  Marland Kitchen atorvastatin 80 MG tablet Take 1 tablet daily or as directed takes 1 tab 3 x/week  .  bisoprolol-hydrochlorothiazide (ZIAC) 5-6.25 MG tablet Take 1 tablet by mouth daily. for blood pressure  . Cholecalciferol (VITAMIN D-3 PO) Take 4,000 Units by mouth 2 (two) times daily.   . Cinnamon 500 MG capsule Take 500 mg by mouth 4 (four) times daily.  . clindamycin (CLINDAGEL) 1 % gel Apply topically 2 (two) times daily.  . fluticasone (FLONASE) 50 MCG/ACT nasal spray Place 2 sprays into both nostrils daily.  . Multiple Vitamin (MULTIVITAMIN) tablet Take 1 tablet by mouth daily.  . Omega-3 Fatty Acids (FISH OIL PO) Take 1,200 mg by mouth 2 (two) times daily.   Marland Kitchen zinc gluconate 50 MG tablet Take 50 mg by mouth daily.   No Known Allergies   Past Medical History:  Diagnosis Date  . Anxiety   . Hyperlipidemia   . Hypertension   . Kidney stones   . OSA (obstructive sleep apnea)   . Other testicular hypofunction   . Unspecified vitamin D deficiency    Health Maintenance  Topic Date Due  . Hepatitis C Screening  11-10-1960  . HIV Screening  06/23/1974  . INFLUENZA VACCINE  05/14/2018  . COLONOSCOPY  04/13/2020  . TETANUS/TDAP  01/16/2026   Immunization History  Administered Date(s) Administered  . Influenza Inj Mdck Quad With Preservative 09/03/2017  . Influenza Split 08/23/2014  . Influenza Whole 08/09/2013  . Influenza, Seasonal, Injecte, Preservative Fre 07/30/2016  . PPD Test 08/23/2014, 10/18/2015, 01/29/2017  . Pneumococcal Conjugate-13 08/23/2014  . Tdap 01/17/2016   Last Colon - 04/13/2010 in [place], [place]-  recc 10 yr f/u - due 04/2020 Last MGM -  Past Surgical History:  Procedure Laterality Date  . HERNIA REPAIR Left groin   1970  . HERNIA REPAIR Right groin   2011  . INCISION AND DRAINAGE PERIRECTAL ABSCESS    . MELANOMA EXCISION  2009  . NASAL SEPTUM SURGERY    . WISDOM TOOTH EXTRACTION  1976   Family History  Problem Relation Age of Onset  . Aneurysm Mother   . Kidney disease Father   . Kidney failure Father    Social History   Tobacco Use  .  Smoking status: Never Smoker  . Smokeless tobacco: Never Used  Substance Use Topics  . Alcohol use: No  . Drug use: No    ROS Constitutional: Denies fever, chills, weight loss/gain, headaches, insomnia,  night sweats, and change in appetite. Does c/o fatigue. Eyes: Denies redness, blurred vision, diplopia, discharge, itchy, watery eyes.  ENT: Denies discharge, congestion, post nasal drip, epistaxis, sore throat, earache, hearing loss, dental pain, Tinnitus, Vertigo, Sinus pain, snoring.  Cardio: Denies chest pain, palpitations, irregular heartbeat, syncope, dyspnea, diaphoresis, orthopnea, PND, claudication, edema Respiratory: denies cough, dyspnea, DOE, pleurisy, hoarseness, laryngitis, wheezing.  Gastrointestinal: Denies dysphagia, heartburn, reflux, water brash, pain, cramps, nausea, vomiting, bloating, diarrhea, constipation, hematemesis, melena, hematochezia, jaundice, hemorrhoids Genitourinary: Denies dysuria, frequency, urgency, nocturia, hesitancy, discharge, hematuria, flank pain Breast: Breast lumps, nipple discharge, bleeding.  Musculoskeletal: Denies arthralgia, myalgia, stiffness, Jt. Swelling, pain, limp, and strain/sprain. Denies falls. Skin: Denies puritis, rash, hives, warts, acne, eczema, changing in skin lesion Neuro: No weakness, tremor, incoordination, spasms, paresthesia, pain Psychiatric: Denies confusion, memory loss, sensory loss. Denies Depression. Endocrine: Denies change in weight, skin, hair change, nocturia, and paresthesia, diabetic polys, visual blurring, hyper / hypo glycemic episodes.  Heme/Lymph: No excessive bleeding, bruising, enlarged lymph nodes.  Physical Exam  BP 132/82   Pulse 68   Temp (!) 97.5 F (36.4 C)   Resp 16   Ht 6' 0.5" (1.842 m)   Wt 210 lb 9.6 oz (95.5 kg)   BMI 28.17 kg/m   General Appearance: Well nourished, well groomed and in no apparent distress.  Eyes: PERRLA, EOMs, conjunctiva no swelling or erythema, normal fundi and  vessels. Sinuses: No frontal/maxillary tenderness ENT/Mouth: EACs patent / TMs  nl. Nares clear without erythema, swelling, mucoid exudates. Oral hygiene is good. No erythema, swelling, or exudate. Tongue normal, non-obstructing. Tonsils not swollen or erythematous. Hearing normal.  Neck: Supple, thyroid not palpable. No bruits, nodes or JVD. Respiratory: Respiratory effort normal.  BS equal and clear bilateral without rales, rhonci, wheezing or stridor. Cardio: Heart sounds are normal with regular rate and rhythm and no murmurs, rubs or gallops. Peripheral pulses are normal and equal bilaterally without edema. No aortic or femoral bruits. Chest: symmetric with normal excursions and percussion. Breasts: Symmetric, without lumps, nipple discharge, retractions, or fibrocystic changes.  Abdomen: Flat, soft with bowel sounds active. Nontender, no guarding, rebound, hernias, masses, or organomegaly.  Lymphatics: Non tender without lymphadenopathy.  Genitourinary: DRE - Prostate 1(+) , smooth & firm. (-) Hemoccult Musculoskeletal: Full ROM all peripheral extremities, joint stability, 5/5 strength, and normal gait. Skin: Warm and dry without rashes, lesions, cyanosis, clubbing or  ecchymosis.  Neuro: Cranial nerves intact, reflexes equal bilaterally. Normal muscle tone, no cerebellar symptoms. Sensation intact.  Pysch: Alert and oriented X 3, normal affect, Insight and Judgment appropriate.   Assessment and Plan  1. Annual Preventative Screening Examination  2. Essential hypertension  - EKG  12-Lead - [place], RETROPERITNL ABD,  LTD - Urinalysis, Routine w reflex microscopic - Microalbumin / creatinine urine ratio - CBC with Differential/Platelet - COMPLETE METABOLIC PANEL WITH GFR - Magnesium - TSH  3. Hyperlipemia, mixed  - EKG 12-Lead - [place], RETROPERITNL ABD,  LTD - Lipid panel - TSH  4. Prediabetes  - EKG 12-Lead - [place], RETROPERITNL ABD,  LTD - Hemoglobin A1c - Insulin, random  5.  Vitamin D deficiency  - VITAMIN D 25 Hydroxyl  6. Testosterone deficiency  - Testosterone  7. OSA on CPAP   8. Screening for colorectal cancer  - POC Hemoccult Bld/Stl  9. Prostate cancer screening  - PSA  10. Screening examination for pulmonary tuberculosis  - PPD  11. Screening for ischemic heart disease  - EKG 12-Lead  12. FHx: heart disease  - EKG 12-Lead - [place], RETROPERITNL ABD,  LTD  13. Screening for AAA (aortic abdominal aneurysm)  - [place], RETROPERITNL ABD,  LTD  14. Fatigue, unspecified type  - Iron,Total/Total Iron Binding Cap - Vitamin B12 - CBC with Differential/Platelet - Testosterone  15. Medication management  - Urinalysis, Routine w reflex microscopic - Microalbumin / creatinine urine ratio            Patient was counseled in prudent diet to achieve/maintain BMI less than 25 for weight control, BP monitoring, regular exercise and medications. Discussed med's effects and SE's. Screening labs and tests as requested with regular follow-up as recommended. Over 40 minutes of exam, counseling, chart review and high complex critical decision making was performed.

## 2018-04-09 NOTE — Patient Instructions (Signed)

## 2018-04-10 LAB — COMPLETE METABOLIC PANEL WITH GFR
AG RATIO: 1.9 (calc) (ref 1.0–2.5)
ALBUMIN MSPROF: 4.6 g/dL (ref 3.6–5.1)
ALKALINE PHOSPHATASE (APISO): 55 U/L (ref 40–115)
ALT: 26 U/L (ref 9–46)
AST: 21 U/L (ref 10–35)
BILIRUBIN TOTAL: 0.7 mg/dL (ref 0.2–1.2)
BUN: 18 mg/dL (ref 7–25)
CHLORIDE: 99 mmol/L (ref 98–110)
CO2: 31 mmol/L (ref 20–32)
Calcium: 9.8 mg/dL (ref 8.6–10.3)
Creat: 1.01 mg/dL (ref 0.70–1.33)
GFR, EST AFRICAN AMERICAN: 95 mL/min/{1.73_m2} (ref >=60)
GFR, Est Non African American: 82 mL/min/{1.73_m2} (ref >=60)
GLUCOSE: 124 mg/dL — AB (ref 65–99)
Globulin: 2.4 g/dL (calc) (ref 1.9–3.7)
POTASSIUM: 4 mmol/L (ref 3.5–5.3)
SODIUM: 139 mmol/L (ref 135–146)
TOTAL PROTEIN: 7 g/dL (ref 6.1–8.1)

## 2018-04-10 LAB — CBC WITH DIFFERENTIAL/PLATELET
Basophils Absolute: 69 cells/uL (ref 0–200)
Basophils Relative: 1 %
Eosinophils Absolute: 62 cells/uL (ref 15–500)
Eosinophils Relative: 0.9 %
HEMATOCRIT: 42.8 % (ref 38.5–50.0)
Hemoglobin: 14.6 g/dL (ref 13.2–17.1)
LYMPHS ABS: 1794 {cells}/uL (ref 850–3900)
MCH: 30.5 pg (ref 27.0–33.0)
MCHC: 34.1 g/dL (ref 32.0–36.0)
MCV: 89.5 fL (ref 80.0–100.0)
MPV: 9.6 fL (ref 7.5–12.5)
Monocytes Relative: 7.9 %
NEUTROS PCT: 64.2 %
Neutro Abs: 4430 cells/uL (ref 1500–7800)
Platelets: 326 10*3/uL (ref 140–400)
RBC: 4.78 10*6/uL (ref 4.20–5.80)
RDW: 11.7 % (ref 11.0–15.0)
Total Lymphocyte: 26 %
WBC mixed population: 545 cells/uL (ref 200–950)
WBC: 6.9 10*3/uL (ref 3.8–10.8)

## 2018-04-10 LAB — TSH: TSH: 2.1 m[IU]/L (ref 0.40–4.50)

## 2018-04-10 LAB — URINALYSIS, ROUTINE W REFLEX MICROSCOPIC
Bilirubin Urine: NEGATIVE
GLUCOSE, UA: NEGATIVE
Hgb urine dipstick: NEGATIVE
KETONES UR: NEGATIVE
Leukocytes, UA: NEGATIVE
NITRITE: NEGATIVE
Protein, ur: NEGATIVE
SPECIFIC GRAVITY, URINE: 1.011 (ref 1.001–1.03)

## 2018-04-10 LAB — IRON, TOTAL/TOTAL IRON BINDING CAP
%SAT: 23 % (calc) (ref 20–48)
Iron: 89 ug/dL (ref 50–180)
TIBC: 381 mcg/dL (calc) (ref 250–425)

## 2018-04-10 LAB — MICROALBUMIN / CREATININE URINE RATIO
Creatinine, Urine: 38 mg/dL (ref 20–320)
MICROALB/CREAT RATIO: 18 ug/mg{creat} (ref <30)
Microalb, Ur: 0.7 mg/dL

## 2018-04-10 LAB — INSULIN, RANDOM: Insulin: 25.7 u[IU]/mL — ABNORMAL HIGH (ref 2.0–19.6)

## 2018-04-10 LAB — HEMOGLOBIN A1C
HEMOGLOBIN A1C: 5.7 %{Hb} — AB (ref <5.7)
Mean Plasma Glucose: 117 (calc)
eAG (mmol/L): 6.5 (calc)

## 2018-04-10 LAB — LIPID PANEL
Cholesterol: 173 mg/dL (ref <200)
HDL: 42 mg/dL (ref >40)
LDL Cholesterol (Calc): 98 mg/dL (calc)
NON-HDL CHOLESTEROL (CALC): 131 mg/dL — AB (ref <130)
Total CHOL/HDL Ratio: 4.1 (calc) (ref <5.0)
Triglycerides: 209 mg/dL — ABNORMAL HIGH (ref <150)

## 2018-04-10 LAB — VITAMIN B12: Vitamin B-12: 503 pg/mL (ref 200–1100)

## 2018-04-10 LAB — VITAMIN D 25 HYDROXY (VIT D DEFICIENCY, FRACTURES): VIT D 25 HYDROXY: 61 ng/mL (ref 30–100)

## 2018-04-10 LAB — PSA: PSA: 1.3 ng/mL (ref <=4.0)

## 2018-04-10 LAB — TESTOSTERONE: Testosterone: 230 ng/dL — ABNORMAL LOW (ref 250–827)

## 2018-04-10 LAB — MAGNESIUM: MAGNESIUM: 2 mg/dL (ref 1.5–2.5)

## 2018-04-11 ENCOUNTER — Encounter: Payer: Self-pay | Admitting: Internal Medicine

## 2018-04-12 ENCOUNTER — Other Ambulatory Visit: Payer: Self-pay | Admitting: Internal Medicine

## 2018-04-13 LAB — TB SKIN TEST
INDURATION: 0 mm
TB SKIN TEST: NEGATIVE

## 2018-04-21 ENCOUNTER — Other Ambulatory Visit: Payer: Self-pay

## 2018-04-21 DIAGNOSIS — Z1211 Encounter for screening for malignant neoplasm of colon: Secondary | ICD-10-CM

## 2018-04-21 DIAGNOSIS — Z1212 Encounter for screening for malignant neoplasm of rectum: Principal | ICD-10-CM

## 2018-04-21 LAB — POC HEMOCCULT BLD/STL (HOME/3-CARD/SCREEN)
Card #2 Fecal Occult Blod, POC: NEGATIVE
Card #3 Fecal Occult Blood, POC: NEGATIVE
Fecal Occult Blood, POC: NEGATIVE

## 2018-07-20 ENCOUNTER — Ambulatory Visit: Payer: Self-pay | Admitting: Physician Assistant

## 2018-07-27 NOTE — Progress Notes (Signed)
FOLLOW UP  Assessment and Plan:   Hypertension Well controlled with current medications  Monitor blood pressure at home; patient to call if consistently greater than 130/80 Continue DASH diet.   Reminder to go to the ER if any CP, SOB, nausea, dizziness, severe HA, changes vision/speech, left arm numbness and tingling and jaw pain.  Cholesterol Currently at LDL goal; continue statin; diet for triglycerides discussed Continue low cholesterol diet and exercise.  Check lipid panel.   Prediabetes Continue diet and exercise.  Perform daily foot/skin check, notify office of any concerning changes.  Check A1C  Overweight with co morbidities Long discussion about weight loss, diet, and exercise Recommended diet heavy in fruits and veggies and low in animal meats, cheeses, and dairy products, appropriate calorie intake Discussed ideal weight for height and initial weight goal (205 lb) Patient will work on walking more, watching diet Will follow up in 3 months  Vitamin D Def At goal at last visit; continue supplementation to maintain goal of 70-100 Defer Vit D level  Continue diet and meds as discussed. Further disposition pending results of labs. Discussed med's effects and SE's.   Over 30 minutes of exam, counseling, chart review, and critical decision making was performed.   Future Appointments  Date Time Provider [place]  10/26/2018  3:30 PM [name], MD [facility] None  05/10/2019  2:00 PM [name], MD [facility] None    ----------------------------------------------------------------------------------------------------------------------  HPI 57 y.o. male  presents for 3 month follow up on hypertension, cholesterol, prediabetes, weight and vitamin D deficiency. He has had a good summer spending time at his new beach house at [employer].   BMI is Body mass index is 28.49 kg/m., he has not been working on diet and exercise. Wants to restart with improved  weather. While at work, he tries to incorporate extra brisk walking while at work, about 10 min here and there. He estimates  Wt Readings from Last 3 Encounters:  07/28/18 213 lb (96.6 kg)  04/09/18 210 lb 9.6 oz (95.5 kg)  12/18/17 211 lb (95.7 kg)   His blood pressure has been controlled at home, today their BP is BP: 124/72  He does not workout. He denies chest pain, shortness of breath, dizziness.   He is on cholesterol medication (atorvastatin 80 mg T,Th,Sat) and denies myalgias. His LDL cholesterol is at goal; trigs were elevated at last check. The cholesterol last visit was:   Lab Results  Component Value Date   CHOL 173 04/09/2018   HDL 42 04/09/2018   LDLCALC 98 04/09/2018   TRIG 209 (H) 04/09/2018   CHOLHDL 4.1 04/09/2018    He has not been working on diet and exercise for prediabetes, and denies foot ulcerations, hypoglycemia , increased appetite, nausea, paresthesia of the feet, polydipsia, polyuria, visual disturbances, vomiting and weight loss. Last A1C in the office was:  Lab Results  Component Value Date   HGBA1C 5.7 (H) 04/09/2018   Patient is on Vitamin D supplement and near goal at last check:   Lab Results  Component Value Date   VD25OH 61 04/09/2018       Current Medications:  Current Outpatient Medications on File Prior to Visit  Medication Sig  . aspirin 81 MG tablet Take 81 mg by mouth daily.  Marland Kitchen atorvastatin (LIPITOR) 80 MG tablet Take 1 tablet daily or as directed  . bisoprolol-hydrochlorothiazide (ZIAC) 5-6.25 MG tablet TAKE 1 TABLET BY MOUTH DAILY FOR BLOOD PRESSURE  . Cholecalciferol (VITAMIN D-3 PO) Take  4,000 Units by mouth 2 (two) times daily.   . Cinnamon 500 MG capsule Take 500 mg by mouth 4 (four) times daily.  . clindamycin (CLINDAGEL) 1 % gel Apply topically 2 (two) times daily.  . fluticasone (FLONASE) 50 MCG/ACT nasal spray Place 2 sprays into both nostrils daily.  . Multiple Vitamin (MULTIVITAMIN) tablet Take 1 tablet by mouth daily.  .  Omega-3 Fatty Acids (FISH OIL PO) Take 1,200 mg by mouth 2 (two) times daily.   Marland Kitchen zinc gluconate 50 MG tablet Take 50 mg by mouth daily.   No current facility-administered medications on file prior to visit.      Allergies: No Known Allergies   Medical History:  Past Medical History:  Diagnosis Date  . Anxiety   . Hyperlipidemia   . Hypertension   . Kidney stones   . OSA (obstructive sleep apnea)   . Other testicular hypofunction   . Unspecified vitamin D deficiency    Family history- Reviewed and unchanged Social history- Reviewed and unchanged   Review of Systems:  Review of Systems  Constitutional: Negative for malaise/fatigue and weight loss.  HENT: Negative for hearing loss and tinnitus.   Eyes: Negative for blurred vision and double vision.  Respiratory: Negative for cough, shortness of breath and wheezing.   Cardiovascular: Negative for chest pain, palpitations, orthopnea, claudication and leg swelling.  Gastrointestinal: Negative for abdominal pain, blood in stool, constipation, diarrhea, heartburn, melena, nausea and vomiting.  Genitourinary: Negative.   Musculoskeletal: Negative for joint pain and myalgias.  Skin: Negative for rash.  Neurological: Negative for dizziness, tingling, sensory change, weakness and headaches.  Endo/Heme/Allergies: Negative for polydipsia.  Psychiatric/Behavioral: Negative.   All other systems reviewed and are negative.   Physical Exam: BP 124/72   Pulse 70   Temp 97.7 F (36.5 C)   Ht 6' 0.5" (1.842 m)   Wt 213 lb (96.6 kg)   SpO2 95%   BMI 28.49 kg/m  Wt Readings from Last 3 Encounters:  07/28/18 213 lb (96.6 kg)  04/09/18 210 lb 9.6 oz (95.5 kg)  12/18/17 211 lb (95.7 kg)   General Appearance: Well nourished, in no apparent distress. Eyes: PERRLA, EOMs, conjunctiva no swelling or erythema Sinuses: No Frontal/maxillary tenderness ENT/Mouth: Ext aud canals clear, TMs without erythema, bulging. No erythema, swelling, or  exudate on post pharynx.  Tonsils not swollen or erythematous. Hearing normal.  Neck: Supple, thyroid normal.  Respiratory: Respiratory effort normal, BS equal bilaterally without rales, rhonchi, wheezing or stridor.  Cardio: RRR with no MRGs. Brisk peripheral pulses without edema.  Abdomen: Soft, + BS.  Non tender, no guarding, rebound, hernias, masses. Lymphatics: Non tender without lymphadenopathy.  Musculoskeletal: Full ROM, 5/5 strength, Normal gait Skin: Warm, dry without rashes, lesions, ecchymosis.  Neuro: Cranial nerves intact. No cerebellar symptoms.  Psych: Awake and oriented X 3, normal affect, Insight and Judgment appropriate.    Izora Ribas, NP 4:31 PM [facility]

## 2018-07-28 ENCOUNTER — Ambulatory Visit: Payer: BLUE CROSS/BLUE SHIELD | Admitting: Adult Health

## 2018-07-28 ENCOUNTER — Encounter: Payer: Self-pay | Admitting: Adult Health

## 2018-07-28 VITALS — BP 124/72 | HR 70 | Temp 97.7°F | Ht 72.5 in | Wt 213.0 lb

## 2018-07-28 DIAGNOSIS — I10 Essential (primary) hypertension: Secondary | ICD-10-CM

## 2018-07-28 DIAGNOSIS — R7303 Prediabetes: Secondary | ICD-10-CM | POA: Diagnosis not present

## 2018-07-28 DIAGNOSIS — E782 Mixed hyperlipidemia: Secondary | ICD-10-CM

## 2018-07-28 DIAGNOSIS — Z23 Encounter for immunization: Secondary | ICD-10-CM

## 2018-07-28 DIAGNOSIS — E663 Overweight: Secondary | ICD-10-CM

## 2018-07-28 DIAGNOSIS — Z79899 Other long term (current) drug therapy: Secondary | ICD-10-CM

## 2018-07-28 DIAGNOSIS — E349 Endocrine disorder, unspecified: Secondary | ICD-10-CM

## 2018-07-28 DIAGNOSIS — E559 Vitamin D deficiency, unspecified: Secondary | ICD-10-CM

## 2018-07-28 NOTE — Patient Instructions (Signed)
Goals    . DIET - INCREASE WATER INTAKE    . HEMOGLOBIN A1C < 5.7      Try to add in 10-15 min of brisk walking daily  Small changes in diet - try to increase daily vegetable servings by 1 serving daily     Know what a healthy weight is for you (roughly BMI <25) and aim to maintain this  Aim for 7+ servings of fruits and vegetables daily  65-80+ fluid ounces of water or unsweet tea for healthy kidneys  Limit to max 1 drink of alcohol per day; avoid smoking/tobacco  Limit animal fats in diet for cholesterol and heart health - choose grass fed whenever available  Avoid highly processed foods, and foods high in saturated/trans fats  Aim for low stress - take time to unwind and care for your mental health  Aim for 150 min of moderate intensity exercise weekly for heart health, and weights twice weekly for bone health  Aim for 7-9 hours of sleep daily

## 2018-07-28 NOTE — Addendum Note (Signed)
Addended by: Chancy Hurter on: 07/28/2018 05:02 PM   Modules accepted: Orders

## 2018-07-29 ENCOUNTER — Other Ambulatory Visit: Payer: Self-pay | Admitting: Adult Health

## 2018-07-29 LAB — CBC WITH DIFFERENTIAL/PLATELET
Basophils Absolute: 82 cells/uL (ref 0–200)
Basophils Relative: 1.3 %
EOS ABS: 50 {cells}/uL (ref 15–500)
Eosinophils Relative: 0.8 %
HEMATOCRIT: 43.1 % (ref 38.5–50.0)
HEMOGLOBIN: 15 g/dL (ref 13.2–17.1)
LYMPHS ABS: 1802 {cells}/uL (ref 850–3900)
MCH: 31.2 pg (ref 27.0–33.0)
MCHC: 34.8 g/dL (ref 32.0–36.0)
MCV: 89.6 fL (ref 80.0–100.0)
MPV: 9.5 fL (ref 7.5–12.5)
Monocytes Relative: 11.6 %
NEUTROS ABS: 3635 {cells}/uL (ref 1500–7800)
Neutrophils Relative %: 57.7 %
Platelets: 339 10*3/uL (ref 140–400)
RBC: 4.81 10*6/uL (ref 4.20–5.80)
RDW: 11.7 % (ref 11.0–15.0)
Total Lymphocyte: 28.6 %
WBC mixed population: 731 cells/uL (ref 200–950)
WBC: 6.3 10*3/uL (ref 3.8–10.8)

## 2018-07-29 LAB — TSH: TSH: 2.11 mIU/L (ref 0.40–4.50)

## 2018-07-29 LAB — HEMOGLOBIN A1C
EAG (MMOL/L): 6.8 (calc)
HEMOGLOBIN A1C: 5.9 %{Hb} — AB (ref <5.7)
Mean Plasma Glucose: 123 (calc)

## 2018-07-29 LAB — COMPLETE METABOLIC PANEL WITH GFR
AG Ratio: 1.7 (calc) (ref 1.0–2.5)
ALBUMIN MSPROF: 4.5 g/dL (ref 3.6–5.1)
ALKALINE PHOSPHATASE (APISO): 57 U/L (ref 40–115)
ALT: 27 U/L (ref 9–46)
AST: 25 U/L (ref 10–35)
BILIRUBIN TOTAL: 0.5 mg/dL (ref 0.2–1.2)
BUN: 18 mg/dL (ref 7–25)
CO2: 31 mmol/L (ref 20–32)
CREATININE: 0.91 mg/dL (ref 0.70–1.33)
Calcium: 9.8 mg/dL (ref 8.6–10.3)
Chloride: 100 mmol/L (ref 98–110)
GFR, EST AFRICAN AMERICAN: 107 mL/min/{1.73_m2} (ref >=60)
GFR, Est Non African American: 92 mL/min/{1.73_m2} (ref >=60)
GLOBULIN: 2.6 g/dL (ref 1.9–3.7)
Glucose, Bld: 104 mg/dL — ABNORMAL HIGH (ref 65–99)
Potassium: 4.3 mmol/L (ref 3.5–5.3)
SODIUM: 138 mmol/L (ref 135–146)
TOTAL PROTEIN: 7.1 g/dL (ref 6.1–8.1)

## 2018-07-29 LAB — LIPID PANEL
CHOLESTEROL: 192 mg/dL (ref <200)
HDL: 47 mg/dL (ref >40)
LDL Cholesterol (Calc): 94 mg/dL (calc)
Non-HDL Cholesterol (Calc): 145 mg/dL (calc) — ABNORMAL HIGH (ref <130)
Total CHOL/HDL Ratio: 4.1 (calc) (ref <5.0)
Triglycerides: 386 mg/dL — ABNORMAL HIGH (ref <150)

## 2018-07-29 MED ORDER — FENOFIBRATE 145 MG PO TABS: 145.0000 mg | ORAL_TABLET | Freq: Every day | ORAL | 1 refills | Status: DC

## 2018-08-08 ENCOUNTER — Other Ambulatory Visit: Payer: Self-pay | Admitting: Adult Health

## 2018-10-23 ENCOUNTER — Other Ambulatory Visit: Payer: Self-pay | Admitting: Internal Medicine

## 2018-10-26 ENCOUNTER — Encounter: Payer: Self-pay | Admitting: Internal Medicine

## 2018-10-26 ENCOUNTER — Ambulatory Visit: Payer: BLUE CROSS/BLUE SHIELD | Admitting: Internal Medicine

## 2018-10-26 VITALS — BP 128/86 | HR 68 | Temp 97.3°F | Resp 16 | Ht 72.5 in | Wt 214.8 lb

## 2018-10-26 DIAGNOSIS — Z79899 Other long term (current) drug therapy: Secondary | ICD-10-CM | POA: Diagnosis not present

## 2018-10-26 DIAGNOSIS — G4733 Obstructive sleep apnea (adult) (pediatric): Secondary | ICD-10-CM

## 2018-10-26 DIAGNOSIS — E559 Vitamin D deficiency, unspecified: Secondary | ICD-10-CM

## 2018-10-26 DIAGNOSIS — E349 Endocrine disorder, unspecified: Secondary | ICD-10-CM

## 2018-10-26 DIAGNOSIS — R7303 Prediabetes: Secondary | ICD-10-CM | POA: Diagnosis not present

## 2018-10-26 DIAGNOSIS — E782 Mixed hyperlipidemia: Secondary | ICD-10-CM

## 2018-10-26 DIAGNOSIS — Z9989 Dependence on other enabling machines and devices: Secondary | ICD-10-CM

## 2018-10-26 DIAGNOSIS — I10 Essential (primary) hypertension: Secondary | ICD-10-CM

## 2018-10-26 NOTE — Progress Notes (Signed)
This very nice 58 y.o. MWM presents for 6 month follow up with HTN, HLD, Pre-Diabetes and Vitamin D Deficiency.      Patient is treated for HTN (2014)  & BP has been controlled at home. Today's BP is at goal - 128/86. Patient has had no complaints of any cardiac type chest pain, palpitations, dyspnea / orthopnea / PND, dizziness, claudication, or dependent edema.     Hyperlipidemia is controlled with diet & meds. Patient denies myalgias or other med SE's. Last Lipids were at goal albeit elevated Trig's: Lab Results  Component Value Date   CHOL 192 07/28/2018   HDL 47 07/28/2018   LDLCALC 94 07/28/2018   TRIG 386 (H) 07/28/2018   CHOLHDL 4.1 07/28/2018      Also, the patient has history of PreDiabetes (A1c 5.8% / 2014)  and has had no symptoms of reactive hypoglycemia, diabetic polys, paresthesias or visual blurring.  Last A1c was near goal: Lab Results  Component Value Date   HGBA1C 5.9 (H) 07/28/2018      Patient has Low Testosterone ("230" in June 2019) & is on Zinc supplements.      Further, the patient also has history of Vitamin D Deficiency  ("47" / 2014) and supplements vitamin D without any suspected side-effects. Last vitamin D was at goal: Lab Results  Component Value Date   VD25OH 61 04/09/2018   Current Outpatient Medications on File Prior to Visit  Medication Sig  . aspirin 81 MG tablet Take 81 mg by mouth daily.  Marland Kitchen atorvastatin (LIPITOR) 80 MG tablet TAKE 1 TABLET BY MOUTH EVERY DAY  . bisoprolol-hydrochlorothiazide (ZIAC) 5-6.25 MG tablet TAKE 1 TABLET BY MOUTH DAILY FOR BLOOD PRESSURE  . Cholecalciferol (VITAMIN D-3 PO) Take 4,000 Units by mouth 2 (two) times daily.   . Cinnamon 500 MG capsule Take 500 mg by mouth 4 (four) times daily.  . clindamycin (CLINDAGEL) 1 % gel Apply topically 2 (two) times daily.  . fenofibrate (TRICOR) 145 MG tablet Take 1 tablet (145 mg total) by mouth daily.  . fluticasone (FLONASE) 50 MCG/ACT nasal spray Place 2 sprays into both  nostrils daily.  . Multiple Vitamin (MULTIVITAMIN) tablet Take 1 tablet by mouth daily.  . Omega-3 Fatty Acids (FISH OIL PO) Take 1,200 mg by mouth 2 (two) times daily.   Marland Kitchen zinc gluconate 50 MG tablet Take 50 mg by mouth daily.   No current facility-administered medications on file prior to visit.    No Known Allergies   PMHx:   Past Medical History:  Diagnosis Date  . Anxiety   . Hyperlipidemia   . Hypertension   . Kidney stones   . OSA (obstructive sleep apnea)   . Other testicular hypofunction   . Unspecified vitamin D deficiency    Immunization History  Administered Date(s) Administered  . Influenza Inj Mdck Quad With Preservative 09/03/2017, 07/28/2018  . Influenza Split 08/23/2014  . Influenza Whole 08/09/2013  . Influenza, Seasonal, Injecte, Preservative Fre 07/30/2016  . PPD Test 08/23/2014, 10/18/2015, 01/29/2017, 04/09/2018  . Pneumococcal Conjugate-13 08/23/2014  . Tdap 01/17/2016   Past Surgical History:  Procedure Laterality Date  . HERNIA REPAIR Left groin   1970  . HERNIA REPAIR Right groin   2011  . INCISION AND DRAINAGE PERIRECTAL ABSCESS    . MELANOMA EXCISION  2009  . NASAL SEPTUM SURGERY    . WISDOM TOOTH EXTRACTION  1976   FHx:    Reviewed / unchanged  SHx:  Reviewed / unchanged   Systems Review:  Constitutional: Denies fever, chills, wt changes, headaches, insomnia, fatigue, night sweats, change in appetite. Eyes: Denies redness, blurred vision, diplopia, discharge, itchy, watery eyes.  ENT: Denies discharge, congestion, post nasal drip, epistaxis, sore throat, earache, hearing loss, dental pain, tinnitus, vertigo, sinus pain, snoring.  CV: Denies chest pain, palpitations, irregular heartbeat, syncope, dyspnea, diaphoresis, orthopnea, PND, claudication or edema. Respiratory: denies cough, dyspnea, DOE, pleurisy, hoarseness, laryngitis, wheezing.  Gastrointestinal: Denies dysphagia, odynophagia, heartburn, reflux, water brash, abdominal pain  or cramps, nausea, vomiting, bloating, diarrhea, constipation, hematemesis, melena, hematochezia  or hemorrhoids. Genitourinary: Denies dysuria, frequency, urgency, nocturia, hesitancy, discharge, hematuria or flank pain. Musculoskeletal: Denies arthralgias, myalgias, stiffness, jt. swelling, pain, limping or strain/sprain.  Skin: Denies pruritus, rash, hives, warts, acne, eczema or change in skin lesion(s). Neuro: No weakness, tremor, incoordination, spasms, paresthesia or pain. Psychiatric: Denies confusion, memory loss or sensory loss. Endo: Denies change in weight, skin or hair change.  Heme/Lymph: No excessive bleeding, bruising or enlarged lymph nodes.  Physical Exam  BP 128/86   Pulse 68   Temp (!) 97.3 F (36.3 C)   Resp 16   Ht 6' 0.5" (1.842 m)   Wt 214 lb 12.8 oz (97.4 kg)   BMI 28.73 kg/m   Appears  well nourished, well groomed  and in no distress.  Eyes: PERRLA, EOMs, conjunctiva no swelling or erythema. Sinuses: No frontal/maxillary tenderness ENT/Mouth: EAC's clear, TM's nl w/o erythema, bulging. Nares clear w/o erythema, swelling, exudates. Oropharynx clear without erythema or exudates. Oral hygiene is good. Tongue normal, non obstructing. Hearing intact.  Neck: Supple. Thyroid not palpable. Car 2+/2+ without bruits, nodes or JVD. Chest: Respirations nl with BS clear & equal w/o rales, rhonchi, wheezing or stridor.  Cor: Heart sounds normal w/ regular rate and rhythm without sig. murmurs, gallops, clicks or rubs. Peripheral pulses normal and equal  without edema.  Abdomen: Soft & bowel sounds normal. Non-tender w/o guarding, rebound, hernias, masses or organomegaly.  Lymphatics: Unremarkable.  Musculoskeletal: Full ROM all peripheral extremities, joint stability, 5/5 strength and normal gait.  Skin: Warm, dry without exposed rashes, lesions or ecchymosis apparent.  Neuro: Cranial nerves intact, reflexes equal bilaterally. Sensory-motor testing grossly intact. Tendon  reflexes grossly intact.  Pysch: Alert & oriented x 3.  Insight and judgement nl & appropriate. No ideations.  Assessment and Plan:  1. Essential hypertension  - Continue medication, monitor blood pressure at home.  - Continue DASH diet.  Reminder to go to the ER if any CP,  SOB, nausea, dizziness, severe HA, changes vision/speech.  - CBC with Differential/Platelet - COMPLETE METABOLIC PANEL WITH GFR - Magnesium - TSH  2. Hyperlipemia, mixed  - Continue diet/meds, exercise,& lifestyle modifications.  - Continue monitor periodic cholesterol/liver & renal functions   - Lipid panel - TSH  3. Prediabetes  - Continue diet, exercise  - Lifestyle modifications.  - Monitor appropriate labs.  - Hemoglobin A1c - Insulin, random  4. Vitamin D deficiency  - Continue supplementation.   - VITAMIN D 25 Hydroxyl  5. Testosterone deficiency  - Testosterone  6. OSA on CPAP   7. Medication management  - CBC with Differential/Platelet - COMPLETE METABOLIC PANEL WITH GFR - Magnesium - Lipid panel - TSH - Hemoglobin A1c - Insulin, random - VITAMIN D 25 Hydroxyl - Testosterone       Discussed  regular exercise, BP monitoring, weight control to achieve/maintain BMI less than 25 and discussed med and SE's. Recommended labs  to assess and monitor clinical status with further disposition pending results of labs. Over 30 minutes of exam, counseling, chart review was performed.

## 2018-10-26 NOTE — Patient Instructions (Signed)

## 2018-10-27 LAB — MAGNESIUM: Magnesium: 1.9 mg/dL (ref 1.5–2.5)

## 2018-10-27 LAB — HEMOGLOBIN A1C
Hgb A1c MFr Bld: 6 % of total Hgb — ABNORMAL HIGH (ref <5.7)
MEAN PLASMA GLUCOSE: 126 (calc)
eAG (mmol/L): 7 (calc)

## 2018-10-27 LAB — COMPLETE METABOLIC PANEL WITH GFR
AG Ratio: 2 (calc) (ref 1.0–2.5)
ALKALINE PHOSPHATASE (APISO): 48 U/L (ref 40–115)
ALT: 33 U/L (ref 9–46)
AST: 25 U/L (ref 10–35)
Albumin: 4.7 g/dL (ref 3.6–5.1)
BUN: 24 mg/dL (ref 7–25)
CO2: 29 mmol/L (ref 20–32)
Calcium: 10.2 mg/dL (ref 8.6–10.3)
Chloride: 99 mmol/L (ref 98–110)
Creat: 1.14 mg/dL (ref 0.70–1.33)
GFR, Est African American: 81 mL/min/{1.73_m2} (ref >=60)
GFR, Est Non African American: 70 mL/min/{1.73_m2} (ref >=60)
Globulin: 2.4 g/dL (calc) (ref 1.9–3.7)
Glucose, Bld: 146 mg/dL — ABNORMAL HIGH (ref 65–99)
Potassium: 3.8 mmol/L (ref 3.5–5.3)
Sodium: 138 mmol/L (ref 135–146)
Total Bilirubin: 0.5 mg/dL (ref 0.2–1.2)
Total Protein: 7.1 g/dL (ref 6.1–8.1)

## 2018-10-27 LAB — CBC WITH DIFFERENTIAL/PLATELET
Absolute Monocytes: 476 cells/uL (ref 200–950)
BASOS PCT: 1 %
Basophils Absolute: 58 cells/uL (ref 0–200)
Eosinophils Absolute: 110 cells/uL (ref 15–500)
Eosinophils Relative: 1.9 %
HCT: 41.3 % (ref 38.5–50.0)
Hemoglobin: 14.2 g/dL (ref 13.2–17.1)
Lymphs Abs: 1624 cells/uL (ref 850–3900)
MCH: 31.3 pg (ref 27.0–33.0)
MCHC: 34.4 g/dL (ref 32.0–36.0)
MCV: 91 fL (ref 80.0–100.0)
MPV: 9.6 fL (ref 7.5–12.5)
Monocytes Relative: 8.2 %
Neutro Abs: 3532 cells/uL (ref 1500–7800)
Neutrophils Relative %: 60.9 %
PLATELETS: 342 10*3/uL (ref 140–400)
RBC: 4.54 10*6/uL (ref 4.20–5.80)
RDW: 11.6 % (ref 11.0–15.0)
TOTAL LYMPHOCYTE: 28 %
WBC: 5.8 10*3/uL (ref 3.8–10.8)

## 2018-10-27 LAB — VITAMIN D 25 HYDROXY (VIT D DEFICIENCY, FRACTURES): Vit D, 25-Hydroxy: 89 ng/mL (ref 30–100)

## 2018-10-27 LAB — TSH: TSH: 1.97 mIU/L (ref 0.40–4.50)

## 2018-10-27 LAB — LIPID PANEL
CHOLESTEROL: 178 mg/dL (ref <200)
HDL: 46 mg/dL (ref >40)
LDL Cholesterol (Calc): 96 mg/dL (calc)
Non-HDL Cholesterol (Calc): 132 mg/dL (calc) — ABNORMAL HIGH (ref <130)
Total CHOL/HDL Ratio: 3.9 (calc) (ref <5.0)
Triglycerides: 239 mg/dL — ABNORMAL HIGH (ref <150)

## 2018-10-27 LAB — TESTOSTERONE: Testosterone: 193 ng/dL — ABNORMAL LOW (ref 250–827)

## 2018-10-27 LAB — INSULIN, RANDOM: Insulin: 78.3 u[IU]/mL — ABNORMAL HIGH (ref 2.0–19.6)

## 2018-12-09 ENCOUNTER — Other Ambulatory Visit: Payer: Self-pay | Admitting: Internal Medicine

## 2018-12-09 MED ORDER — FLUTICASONE PROPIONATE 50 MCG/ACT NA SUSP: NASAL | 3 refills | Status: DC

## 2018-12-30 ENCOUNTER — Other Ambulatory Visit: Payer: Self-pay | Admitting: Adult Health

## 2019-02-03 NOTE — Progress Notes (Signed)
FOLLOW UP  Assessment and Plan:   Hypertension Well controlled with current medications  Monitor blood pressure at home; patient to call if consistently greater than 130/80 Continue DASH diet.   Reminder to go to the ER if any CP, SOB, nausea, dizziness, severe HA, changes vision/speech, left arm numbness and tingling and jaw pain.  Cholesterol Currently at LDL goal; continue statin, fenofibrate Continue low cholesterol diet and exercise.  Check lipid panel.   Prediabetes Continue diet and exercise.  Perform daily foot/skin check, notify office of any concerning changes.  Check A1C  Overweight with co morbidities Long discussion about weight loss, diet, and exercise Recommended diet heavy in fruits and veggies and low in animal meats, cheeses, and dairy products, appropriate calorie intake Discussed ideal weight for height and initial weight goal (205 lb) Patient will work on walking more, watching diet, limiting snacky foods Will follow up in 3 months  Vitamin D Def At goal at last visit; continue supplementation to maintain goal of 70-100 Defer Vit D level  Testosterone deficiency Denies symptoms; on zinc 50 mg daily supplement. Encouraged weight loss and HIIT exercise.   Continue diet and meds as discussed. Further disposition pending results of labs. Discussed med's effects and SE's.   Over 30 minutes of exam, counseling, chart review, and critical decision making was performed.   Future Appointments  Date Time Provider [place]  05/10/2019  2:00 PM [name], MD [id number] None    ----------------------------------------------------------------------------------------------------------------------  HPI 58 y.o. male  presents for 3 month follow up on hypertension, cholesterol, prediabetes, weight and vitamin D deficiency.   He is working from home due to covid 19.   BMI is Body mass index is 28.36 kg/m., he has been working on diet and exercise.  Wants to restart with improved weather. He was down to 208 lb at one point, has come up some since covid 19. He has been walking as much as possible weather permitting.  Wt Readings from Last 3 Encounters:  02/04/19 212 lb (96.2 kg)  10/26/18 214 lb 12.8 oz (97.4 kg)  07/28/18 213 lb (96.6 kg)   His blood pressure has been controlled at home, today their BP is BP: 138/78  He does not workout. He denies chest pain, shortness of breath, dizziness.   He is on cholesterol medication (atorvastatin 80 mg T,Th,Sat, fenofibrate 145 mg daily) and denies myalgias. His LDL cholesterol is at goal; trigs remained elevated at last check. The cholesterol last visit was:   Lab Results  Component Value Date   CHOL 178 10/26/2018   HDL 46 10/26/2018   LDLCALC 96 10/26/2018   TRIG 239 (H) 10/26/2018   CHOLHDL 3.9 10/26/2018    He has not been working on diet and exercise for prediabetes, and denies foot ulcerations, hypoglycemia , increased appetite, nausea, paresthesia of the feet, polydipsia, polyuria, visual disturbances, vomiting and weight loss. Last A1C in the office was:  Lab Results  Component Value Date   HGBA1C 6.0 (H) 10/26/2018   Patient is on Vitamin D supplement and at goal at last check:   Lab Results  Component Value Date   VD25OH 89 10/26/2018     He has a history of testosterone deficiency, on zinc 50 mg supplement.  Lab Results  Component Value Date   TESTOSTERONE 193 (L) 10/26/2018     Current Medications:  Current Outpatient Medications on File Prior to Visit  Medication Sig  . aspirin 81 MG tablet Take 81 mg by mouth  daily.  . atorvastatin (LIPITOR) 80 MG tablet TAKE 1 TABLET BY MOUTH EVERY DAY  . bisoprolol-hydrochlorothiazide (ZIAC) 5-6.25 MG tablet TAKE 1 TABLET BY MOUTH DAILY FOR BLOOD PRESSURE  . Cholecalciferol (VITAMIN D-3 PO) Take 4,000 Units by mouth 2 (two) times daily.   . Cinnamon 500 MG capsule Take 500 mg by mouth 4 (four) times daily.  . clindamycin  (CLINDAGEL) 1 % gel Apply topically 2 (two) times daily.  . fenofibrate (TRICOR) 145 MG tablet TAKE 1 TABLET(145 MG) BY MOUTH DAILY  . fluticasone (FLONASE) 50 MCG/ACT nasal spray Use 1 to 2 sprays each Nostril 1 to 2 x /day  . Multiple Vitamin (MULTIVITAMIN) tablet Take 1 tablet by mouth daily.  . Omega-3 Fatty Acids (FISH OIL PO) Take 1,200 mg by mouth 2 (two) times daily.   Marland Kitchen zinc gluconate 50 MG tablet Take 50 mg by mouth daily.   No current facility-administered medications on file prior to visit.      Allergies: No Known Allergies   Medical History:  Past Medical History:  Diagnosis Date  . Anxiety   . Hyperlipidemia   . Hypertension   . Kidney stones   . OSA (obstructive sleep apnea)   . Other testicular hypofunction   . Unspecified vitamin D deficiency    Family history- Reviewed and unchanged Social history- Reviewed and unchanged   Review of Systems:  Review of Systems  Constitutional: Negative for malaise/fatigue and weight loss.  HENT: Negative for hearing loss and tinnitus.   Eyes: Negative for blurred vision and double vision.  Respiratory: Negative for cough, shortness of breath and wheezing.   Cardiovascular: Negative for chest pain, palpitations, orthopnea, claudication and leg swelling.  Gastrointestinal: Negative for abdominal pain, blood in stool, constipation, diarrhea, heartburn, melena, nausea and vomiting.  Genitourinary: Negative.   Musculoskeletal: Negative for joint pain and myalgias.  Skin: Negative for rash.  Neurological: Negative for dizziness, tingling, sensory change, weakness and headaches.  Endo/Heme/Allergies: Negative for polydipsia.  Psychiatric/Behavioral: Negative.   All other systems reviewed and are negative.   Physical Exam: BP 138/78   Pulse (!) 52   Temp (!) 96.4 F (35.8 C)   Ht 6' 0.5" (1.842 m)   Wt 212 lb (96.2 kg)   SpO2 98%   BMI 28.36 kg/m  Wt Readings from Last 3 Encounters:  02/04/19 212 lb (96.2 kg)   10/26/18 214 lb 12.8 oz (97.4 kg)  07/28/18 213 lb (96.6 kg)   General Appearance: Well nourished, in no apparent distress. Eyes: PERRLA, EOMs, conjunctiva no swelling or erythema Sinuses: No Frontal/maxillary tenderness ENT/Mouth: Ext aud canals clear, TMs without erythema, bulging. No erythema, swelling, or exudate on post pharynx.  Tonsils not swollen or erythematous. Hearing normal.  Neck: Supple, thyroid normal.  Respiratory: Respiratory effort normal, BS equal bilaterally without rales, rhonchi, wheezing or stridor.  Cardio: RRR with no MRGs. Brisk peripheral pulses without edema.  Abdomen: Soft, + BS.  Non tender, no guarding, rebound, hernias, masses. Lymphatics: Non tender without lymphadenopathy.  Musculoskeletal: Full ROM, 5/5 strength, Normal gait Skin: Warm, dry without rashes, lesions, ecchymosis.  Neuro: Cranial nerves intact. No cerebellar symptoms.  Psych: Awake and oriented X 3, normal affect, Insight and Judgment appropriate.    Izora Ribas, NP 11:43 AM [facility]

## 2019-02-04 ENCOUNTER — Other Ambulatory Visit: Payer: Self-pay

## 2019-02-04 ENCOUNTER — Ambulatory Visit: Payer: BLUE CROSS/BLUE SHIELD | Admitting: Adult Health

## 2019-02-04 ENCOUNTER — Encounter: Payer: Self-pay | Admitting: Adult Health

## 2019-02-04 VITALS — BP 138/78 | HR 52 | Temp 96.4°F | Ht 72.5 in | Wt 212.0 lb

## 2019-02-04 DIAGNOSIS — E559 Vitamin D deficiency, unspecified: Secondary | ICD-10-CM

## 2019-02-04 DIAGNOSIS — E663 Overweight: Secondary | ICD-10-CM

## 2019-02-04 DIAGNOSIS — R7309 Other abnormal glucose: Secondary | ICD-10-CM | POA: Diagnosis not present

## 2019-02-04 DIAGNOSIS — E782 Mixed hyperlipidemia: Secondary | ICD-10-CM | POA: Diagnosis not present

## 2019-02-04 DIAGNOSIS — E349 Endocrine disorder, unspecified: Secondary | ICD-10-CM | POA: Diagnosis not present

## 2019-02-04 DIAGNOSIS — I10 Essential (primary) hypertension: Secondary | ICD-10-CM | POA: Diagnosis not present

## 2019-02-04 DIAGNOSIS — Z79899 Other long term (current) drug therapy: Secondary | ICD-10-CM

## 2019-02-04 NOTE — Patient Instructions (Signed)
Goals    . DIET - INCREASE WATER INTAKE    . Exercise 150 min/wk Moderate Activity    . HEMOGLOBIN A1C < 5.7        Know what a healthy weight is for you (roughly BMI <25) and aim to maintain this  Aim for 7+ servings of fruits and vegetables daily  65-80+ fluid ounces of water or unsweet tea for healthy kidneys  Limit to max 1 drink of alcohol per day; avoid smoking/tobacco  Limit animal fats in diet for cholesterol and heart health - choose grass fed whenever available  Avoid highly processed foods, and foods high in saturated/trans fats  Aim for low stress - take time to unwind and care for your mental health  Aim for 150 min of moderate intensity exercise weekly for heart health, and weights twice weekly for bone health  Aim for 7-9 hours of sleep daily        When it comes to diets, agreement about the perfect plan isn't easy to find, even among the experts. Experts at the [employer] developed an idea known as the Healthy Eating Plate. Just imagine a plate divided into logical, healthy portions.  The emphasis is on diet quality:  Load up on vegetables and fruits - one-half of your plate: Aim for color and variety, and remember that potatoes don't count.  Go for whole grains - one-quarter of your plate: Whole wheat, barley, wheat berries, quinoa, oats, brown rice, and foods made with them. If you want pasta, go with whole wheat pasta.  Protein power - one-quarter of your plate: Fish, chicken, beans, and nuts are all healthy, versatile protein sources. Limit red meat.  The diet, however, does go beyond the plate, offering a few other suggestions.  Use healthy plant oils, such as olive, canola, soy, corn, sunflower and peanut. Check the labels, and avoid partially hydrogenated oil, which have unhealthy trans fats.  If you're thirsty, drink water. Coffee and tea are good in moderation, but skip sugary drinks and limit milk and dairy products to one  or two daily servings.  The type of carbohydrate in the diet is more important than the amount. Some sources of carbohydrates, such as vegetables, fruits, whole grains, and beans-are healthier than others.  Finally, stay active.

## 2019-02-05 LAB — CBC WITH DIFFERENTIAL/PLATELET
Absolute Monocytes: 605 cells/uL (ref 200–950)
Basophils Absolute: 61 cells/uL (ref 0–200)
Basophils Relative: 1.1 %
Eosinophils Absolute: 99 cells/uL (ref 15–500)
Eosinophils Relative: 1.8 %
HCT: 43.1 % (ref 38.5–50.0)
Hemoglobin: 14.6 g/dL (ref 13.2–17.1)
Lymphs Abs: 1540 cells/uL (ref 850–3900)
MCH: 30.5 pg (ref 27.0–33.0)
MCHC: 33.9 g/dL (ref 32.0–36.0)
MCV: 90.2 fL (ref 80.0–100.0)
MPV: 9.7 fL (ref 7.5–12.5)
Monocytes Relative: 11 %
Neutro Abs: 3196 cells/uL (ref 1500–7800)
Neutrophils Relative %: 58.1 %
Platelets: 343 10*3/uL (ref 140–400)
RBC: 4.78 10*6/uL (ref 4.20–5.80)
RDW: 11.4 % (ref 11.0–15.0)
Total Lymphocyte: 28 %
WBC: 5.5 10*3/uL (ref 3.8–10.8)

## 2019-02-05 LAB — COMPLETE METABOLIC PANEL WITH GFR
AG Ratio: 2.3 (calc) (ref 1.0–2.5)
ALT: 30 U/L (ref 9–46)
AST: 28 U/L (ref 10–35)
Albumin: 4.8 g/dL (ref 3.6–5.1)
Alkaline phosphatase (APISO): 41 U/L (ref 35–144)
BUN/Creatinine Ratio: 24 (calc) — ABNORMAL HIGH (ref 6–22)
BUN: 26 mg/dL — ABNORMAL HIGH (ref 7–25)
CO2: 29 mmol/L (ref 20–32)
Calcium: 10.3 mg/dL (ref 8.6–10.3)
Chloride: 103 mmol/L (ref 98–110)
Creat: 1.1 mg/dL (ref 0.70–1.33)
GFR, Est African American: 85 mL/min/{1.73_m2} (ref >=60)
GFR, Est Non African American: 73 mL/min/{1.73_m2} (ref >=60)
Globulin: 2.1 g/dL (calc) (ref 1.9–3.7)
Glucose, Bld: 90 mg/dL (ref 65–99)
Potassium: 4.4 mmol/L (ref 3.5–5.3)
Sodium: 141 mmol/L (ref 135–146)
Total Bilirubin: 0.5 mg/dL (ref 0.2–1.2)
Total Protein: 6.9 g/dL (ref 6.1–8.1)

## 2019-02-05 LAB — LIPID PANEL
Cholesterol: 187 mg/dL (ref <200)
HDL: 46 mg/dL (ref >=40)
LDL Cholesterol (Calc): 114 mg/dL (calc) — ABNORMAL HIGH
Non-HDL Cholesterol (Calc): 141 mg/dL (calc) — ABNORMAL HIGH (ref <130)
Total CHOL/HDL Ratio: 4.1 (calc) (ref <5.0)
Triglycerides: 156 mg/dL — ABNORMAL HIGH (ref <150)

## 2019-02-05 LAB — HEMOGLOBIN A1C
Hgb A1c MFr Bld: 5.9 % of total Hgb — ABNORMAL HIGH (ref <5.7)
Mean Plasma Glucose: 123 (calc)
eAG (mmol/L): 6.8 (calc)

## 2019-02-05 LAB — TSH: TSH: 2.07 mIU/L (ref 0.40–4.50)

## 2019-03-23 ENCOUNTER — Other Ambulatory Visit: Payer: Self-pay | Admitting: Internal Medicine

## 2019-03-30 ENCOUNTER — Other Ambulatory Visit: Payer: Self-pay | Admitting: Internal Medicine

## 2019-03-30 DIAGNOSIS — E782 Mixed hyperlipidemia: Secondary | ICD-10-CM

## 2019-03-30 MED ORDER — ATORVASTATIN CALCIUM 80 MG PO TABS: ORAL_TABLET | ORAL | 0 refills | Status: DC

## 2019-04-03 ENCOUNTER — Other Ambulatory Visit: Payer: Self-pay | Admitting: Internal Medicine

## 2019-04-03 DIAGNOSIS — E782 Mixed hyperlipidemia: Secondary | ICD-10-CM

## 2019-04-03 MED ORDER — ATORVASTATIN CALCIUM 80 MG PO TABS: ORAL_TABLET | ORAL | 1 refills | Status: DC

## 2019-05-09 DIAGNOSIS — R7303 Prediabetes: Secondary | ICD-10-CM

## 2019-05-09 DIAGNOSIS — N138 Other obstructive and reflux uropathy: Secondary | ICD-10-CM | POA: Insufficient documentation

## 2019-05-09 HISTORY — DX: Prediabetes: R73.03

## 2019-05-09 NOTE — Patient Instructions (Signed)
- Vit D  And Vit C 1,000 mg  are recommended to help protect  against the Covid_19 and other Corona viruses.   - Also it's recommended to take Zinc 50 mg to help  protect against the Covid_19  And best place to get  is also on [employer] and don't pay more than 6-8 cents /pill !   =============================== Coronavirus (COVID-19) Are you at risk?  Are you at risk for the Coronavirus (COVID-19)?  To be considered HIGH RISK for Coronavirus (COVID-19), you have to meet the following criteria:  . Traveled to [place], [place], [place], [place] or [place]; or in the [place] to [place], [place], [place]  . or [place]; and have fever, cough, and shortness of breath within the last 2 weeks of travel OR . Been in close contact with a person diagnosed with COVID-19 within the last 2 weeks and have  . fever, cough,and shortness of breath .  . IF YOU DO NOT MEET THESE CRITERIA, YOU ARE CONSIDERED LOW RISK FOR COVID-19.  What to do if you are HIGH RISK for COVID-19?  . If you are having a medical emergency, call 911. . Seek medical care right away. Before you go to a doctor's office, urgent care or emergency department, .  call ahead and tell them about your recent travel, contact with someone diagnosed with COVID-19  .  and your symptoms.  . You should receive instructions from your physician's office regarding next steps of care.  . When you arrive at healthcare provider, tell the healthcare staff immediately you have returned from  . visiting [place], [place], [place], [place] or [place]; or traveled in the [place] to [place], [place],  . [place] or [place] in the last two weeks or you have been in close contact with a person diagnosed with  . COVID-19 in the last 2 weeks.   . Tell the health care staff about your symptoms: fever, cough and shortness of breath. . After you have been seen by a medical provider, you will be either: o Tested for (COVID-19) and  discharged home on quarantine except to seek medical care if  o symptoms worsen, and asked to  - Stay home and avoid contact with others until you get your results (4-5 days)  - Avoid travel on public transportation if possible (such as bus, train, or airplane) or o Sent to the Emergency Department by EMS for evaluation, COVID-19 testing  and  o possible admission depending on your condition and test results.  What to do if you are LOW RISK for COVID-19?  Reduce your risk of any infection by using the same precautions used for avoiding the common cold or flu:  . Wash your hands often with soap and warm water for at least 20 seconds.  If soap and water are not readily available,  . use an alcohol-based hand sanitizer with at least 60% alcohol.  . If coughing or sneezing, cover your mouth and nose by coughing or sneezing into the elbow areas of your shirt or coat, .  into a tissue or into your sleeve (not your hands). . Avoid shaking hands with others and consider head nods or verbal greetings only. . Avoid touching your eyes, nose, or mouth with unwashed hands.  . Avoid close contact with people who are sick. . Avoid places or events with large numbers of people in one location, like concerts or sporting events. . Carefully consider travel plans   you have or are making. . If you are planning any travel outside or inside the [place], visit the CDC's Travelers' Health webpage for the latest health notices. . If you have some symptoms but not all symptoms, continue to monitor at home and seek medical attention  . if your symptoms worsen. . If you are having a medical emergency, call 911. >>>>>>>>>>>>>>>>>>>>>>>>>>>> Preventive Care for Adults  A healthy lifestyle and preventive care can promote health and wellness. Preventive health guidelines for men include the following key practices:  A routine yearly physical is a good way to check with your health care provider about your health and  preventative screening. It is a chance to share any concerns and updates on your health and to receive a thorough exam.  Visit your dentist for a routine exam and preventative care every 6 months. Brush your teeth twice a day and floss once a day. Good oral hygiene prevents tooth decay and gum disease.  The frequency of eye exams is based on your age, health, family medical history, use of contact lenses, and other factors. Follow your health care provider's recommendations for frequency of eye exams.  Eat a healthy diet. Foods such as vegetables, fruits, whole grains, low-fat dairy products, and lean protein foods contain the nutrients you need without too many calories. Decrease your intake of foods high in solid fats, added sugars, and salt. Eat the right amount of calories for you. Get information about a proper diet from your health care provider, if necessary.  Regular physical exercise is one of the most important things you can do for your health. Most adults should get at least 150 minutes of moderate-intensity exercise (any activity that increases your heart rate and causes you to sweat) each week. In addition, most adults need muscle-strengthening exercises on 2 or more days a week.  Maintain a healthy weight. The body mass index (BMI) is a screening tool to identify possible weight problems. It provides an estimate of body fat based on height and weight. Your health care provider can find your BMI and can help you achieve or maintain a healthy weight. For adults 20 years and older:  A BMI below 18.5 is considered underweight.  A BMI of 18.5 to 24.9 is normal.  A BMI of 25 to 29.9 is considered overweight.  A BMI of 30 and above is considered obese.  Maintain normal blood lipids and cholesterol levels by exercising and minimizing your intake of saturated fat. Eat a balanced diet with plenty of fruit and vegetables. Blood tests for lipids and cholesterol should begin at age 20 and be  repeated every 5 years. If your lipid or cholesterol levels are high, you are over 50, or you are at high risk for heart disease, you may need your cholesterol levels checked more frequently. Ongoing high lipid and cholesterol levels should be treated with medicines if diet and exercise are not working.  If you smoke, find out from your health care provider how to quit. If you do not use tobacco, do not start.  Lung cancer screening is recommended for adults aged 55-80 years who are at high risk for developing lung cancer because of a history of smoking. A yearly low-dose CT scan of the lungs is recommended for people who have at least a 30-pack-year history of smoking and are a current smoker or have quit within the past 15 years. A pack year of smoking is smoking an average of 1 pack of cigarettes a   day for 1 year (for example: 1 pack a day for 30 years or 2 packs a day for 15 years). Yearly screening should continue until the smoker has stopped smoking for at least 15 years. Yearly screening should be stopped for people who develop a health problem that would prevent them from having lung cancer treatment.  If you choose to drink alcohol, do not have more than 2 drinks per day. One drink is considered to be 12 ounces (355 mL) of beer, 5 ounces (148 mL) of wine, or 1.5 ounces (44 mL) of liquor.  Avoid use of street drugs. Do not share needles with anyone. Ask for help if you need support or instructions about stopping the use of drugs.  High blood pressure causes heart disease and increases the risk of stroke. Your blood pressure should be checked at least every 1-2 years. Ongoing high blood pressure should be treated with medicines, if weight loss and exercise are not effective.  If you are 45-79 years old, ask your health care provider if you should take aspirin to prevent heart disease.  Diabetes screening involves taking a blood sample to check your fasting blood sugar level. This should be done  once every 3 years, after age 45, if you are within normal weight and without risk factors for diabetes. Testing should be considered at a younger age or be carried out more frequently if you are overweight and have at least 1 risk factor for diabetes.  Colorectal cancer can be detected and often prevented. Most routine colorectal cancer screening begins at the age of 50 and continues through age 75. However, your health care provider may recommend screening at an earlier age if you have risk factors for colon cancer. On a yearly basis, your health care provider may provide home test kits to check for hidden blood in the stool. Use of a small camera at the end of a tube to directly examine the colon (sigmoidoscopy or colonoscopy) can detect the earliest forms of colorectal cancer. Talk to your health care provider about this at age 50, when routine screening begins. Direct exam of the colon should be repeated every 5-10 years through age 75, unless early forms of precancerous polyps or small growths are found.   Talk with your health care provider about prostate cancer screening.  Testicular cancer screening isrecommended for adult males. Screening includes self-exam, a health care provider exam, and other screening tests. Consult with your health care provider about any symptoms you have or any concerns you have about testicular cancer.  Use sunscreen. Apply sunscreen liberally and repeatedly throughout the day. You should seek shade when your shadow is shorter than you. Protect yourself by wearing long sleeves, pants, a wide-brimmed hat, and sunglasses year round, whenever you are outdoors.  Once a month, do a whole-body skin exam, using a mirror to look at the skin on your back. Tell your health care provider about new moles, moles that have irregular borders, moles that are larger than a pencil eraser, or moles that have changed in shape or color.  Stay current with required vaccines  (immunizations).  Influenza vaccine. All adults should be immunized every year.  Tetanus, diphtheria, and acellular pertussis (Td, Tdap) vaccine. An adult who has not previously received Tdap or who does not know his vaccine status should receive 1 dose of Tdap. This initial dose should be followed by tetanus and diphtheria toxoids (Td) booster doses every 10 years. Adults with an unknown or incomplete history   of completing a 3-dose immunization series with Td-containing vaccines should begin or complete a primary immunization series including a Tdap dose. Adults should receive a Td booster every 10 years.  Varicella vaccine. An adult without evidence of immunity to varicella should receive 2 doses or a second dose if he has previously received 1 dose.  Human papillomavirus (HPV) vaccine. Males aged 13-21 years who have not received the vaccine previously should receive the 3-dose series. Males aged 22-26 years may be immunized. Immunization is recommended through the age of 26 years for any male who has sex with males and did not get any or all doses earlier. Immunization is recommended for any person with an immunocompromised condition through the age of 26 years if he did not get any or all doses earlier. During the 3-dose series, the second dose should be obtained 4-8 weeks after the first dose. The third dose should be obtained 24 weeks after the first dose and 16 weeks after the second dose.  Zoster vaccine. One dose is recommended for adults aged 60 years or older unless certain conditions are present.    PREVNAR  - Pneumococcal 13-valent conjugate (PCV13) vaccine. When indicated, a person who is uncertain of his immunization history and has no record of immunization should receive the PCV13 vaccine. An adult aged 19 years or older who has certain medical conditions and has not been previously immunized should receive 1 dose of PCV13 vaccine. This PCV13 should be followed with a dose of  pneumococcal polysaccharide (PPSV23) vaccine. The PPSV23 vaccine dose should be obtained at least 1 r more year(s) after the dose of PCV13 vaccine. An adult aged 19 years or older who has certain medical conditions and previously received 1 or more doses of PPSV23 vaccine should receive 1 dose of PCV13. The PCV13 vaccine dose should be obtained 1 or more years after the last PPSV23 vaccine dose.    PNEUMOVAX - Pneumococcal polysaccharide (PPSV23) vaccine. When PCV13 is also indicated, PCV13 should be obtained first. All adults aged 65 years and older should be immunized. An adult younger than age 65 years who has certain medical conditions should be immunized. Any person who resides in a nursing home or long-term care facility should be immunized. An adult smoker should be immunized. People with an immunocompromised condition and certain other conditions should receive both PCV13 and PPSV23 vaccines. People with human immunodeficiency virus (HIV) infection should be immunized as soon as possible after diagnosis. Immunization during chemotherapy or radiation therapy should be avoided. Routine use of PPSV23 vaccine is not recommended for American Indians, [place] Natives, or people younger than 65 years unless there are medical conditions that require PPSV23 vaccine. When indicated, people who have unknown immunization and have no record of immunization should receive PPSV23 vaccine. One-time revaccination 5 years after the first dose of PPSV23 is recommended for people aged 19-64 years who have chronic kidney failure, nephrotic syndrome, asplenia, or immunocompromised conditions. People who received 1-2 doses of PPSV23 before age 65 years should receive another dose of PPSV23 vaccine at age 65 years or later if at least 5 years have passed since the previous dose. Doses of PPSV23 are not needed for people immunized with PPSV23 at or after age 65 years.    Hepatitis A vaccine. Adults who wish to be protected  from this disease, have certain high-risk conditions, work with hepatitis A-infected animals, work in hepatitis A research labs, or travel to or work in countries with a high rate of   hepatitis A should be immunized. Adults who were previously unvaccinated and who anticipate close contact with an international adoptee during the first 60 days after arrival in the [place] from a country with a high rate of hepatitis A should be immunized.    Hepatitis B vaccine. Adults should be immunized if they wish to be protected from this disease, have certain high-risk conditions, may be exposed to blood or other infectious body fluids, are household contacts or sex partners of hepatitis B positive people, are clients or workers in certain care facilities, or travel to or work in countries with a high rate of hepatitis B.   Preventive Service / Frequency   Ages 40 to 64  Blood pressure check.  Lipid and cholesterol check  Lung cancer screening. / Every year if you are aged 55-80 years and have a 30-pack-year history of smoking and currently smoke or have quit within the past 15 years. Yearly screening is stopped once you have quit smoking for at least 15 years or develop a health problem that would prevent you from having lung cancer treatment.  Fecal occult blood test (FOBT) of stool. / Every year beginning at age 50 and continuing until age 75. You may not have to do this test if you get a colonoscopy every 10 years.  Flexible sigmoidoscopy** or colonoscopy.** / Every 5 years for a flexible sigmoidoscopy or every 10 years for a colonoscopy beginning at age 50 and continuing until age 75. Screening for abdominal aortic aneurysm (AAA)  by ultrasound is recommended for people who have history of high blood pressure or who are current or former smokers. +++++++++++ Recommend Adult Low Dose Aspirin or  coated  Aspirin 81 mg daily  To reduce risk of Colon Cancer 40 %,  Skin Cancer 26 % ,  Malignant  Melanoma 46%  and  Pancreatic cancer 60% ++++++++++++++++++++ Vitamin D goal  is between 70-100.  Please make sure that you are taking your Vitamin D as directed.  It is very important as a natural anti-inflammatory  helping hair, skin, and nails, as well as reducing stroke and heart attack risk.  It helps your bones and helps with mood. It also decreases numerous cancer risks so please take it as directed.  Low Vit D is associated with a 200-300% higher risk for CANCER  and 200-300% higher risk for HEART   ATTACK  &  STROKE.   ...................................... It is also associated with higher death rate at younger ages,  autoimmune diseases like Rheumatoid arthritis, Lupus, Multiple Sclerosis.    Also many other serious conditions, like depression, Alzheimer's Dementia, infertility, muscle aches, fatigue, fibromyalgia - just to name a few. +++++++++++++++++++++ Recommend the book "The END of DIETING" by Dr Joel Fuhrman  & the book "The END of DIABETES " by Dr Joel Fuhrman At [employer] - get book & Audio CD's    Being diabetic has a  300% increased risk for heart attack, stroke, cancer, and alzheimer- type vascular dementia. It is very important that you work harder with diet by avoiding all foods that are white. Avoid white rice (brown & wild rice is OK), white potatoes (sweetpotatoes in moderation is OK), White bread or wheat bread or anything made out of white flour like bagels, donuts, rolls, buns, biscuits, cakes, pastries, cookies, pizza crust, and pasta (made from white flour & egg whites) - vegetarian pasta or spinach or wheat pasta is OK. Multigrain breads like Arnold's or [employer], or multigrain sandwich thins   or flatbreads.  Diet, exercise and weight loss can reverse and cure diabetes in the early stages.  Diet, exercise and weight loss is very important in the control and prevention of complications of diabetes which affects every system in your body, ie. Brain -  dementia/stroke, eyes - glaucoma/blindness, heart - heart attack/heart failure, kidneys - dialysis, stomach - gastric paralysis, intestines - malabsorption, nerves - severe painful neuritis, circulation - gangrene & loss of a leg(s), and finally cancer and Alzheimers.    I recommend avoid fried & greasy foods,  sweets/candy, white rice (brown or wild rice or Quinoa is OK), white potatoes (sweet potatoes are OK) - anything made from white flour - bagels, doughnuts, rolls, buns, biscuits,white and wheat breads, pizza crust and traditional pasta made of white flour & egg white(vegetarian pasta or spinach or wheat pasta is OK).  Multi-grain bread is OK - like multi-grain flat bread or sandwich thins. Avoid alcohol in excess. Exercise is also important.    Eat all the vegetables you want - avoid meat, especially red meat and dairy - especially cheese.  Cheese is the most concentrated form of trans-fats which is the worst thing to clog up our arteries. Veggie cheese is OK which can be found in the fresh produce section at [employer] or [employer] or [employer]  ++++++++++++++++++++++ DASH Eating Plan  DASH stands for "Dietary Approaches to Stop Hypertension."   The DASH eating plan is a healthy eating plan that has been shown to reduce high blood pressure (hypertension). Additional health benefits may include reducing the risk of type 2 diabetes mellitus, heart disease, and stroke. The DASH eating plan may also help with weight loss. WHAT DO I NEED TO KNOW ABOUT THE DASH EATING PLAN? For the DASH eating plan, you will follow these general guidelines:  Choose foods with a percent daily value for sodium of less than 5% (as listed on the food label).  Use salt-free seasonings or herbs instead of table salt or sea salt.  Check with your health care provider or pharmacist before using salt substitutes.  Eat lower-sodium products, often labeled as "lower sodium" or "no salt added."  Eat fresh  foods.  Eat more vegetables, fruits, and low-fat dairy products.  Choose whole grains. Look for the word "whole" as the first word in the ingredient list.  Choose fish   Limit sweets, desserts, sugars, and sugary drinks.  Choose heart-healthy fats.  Eat veggie cheese   Eat more home-cooked food and less restaurant, buffet, and fast food.  Limit fried foods.  Cook foods using methods other than frying.  Limit canned vegetables. If you do use them, rinse them well to decrease the sodium.  When eating at a restaurant, ask that your food be prepared with less salt, or no salt if possible.                      WHAT FOODS CAN I EAT? Read Dr Joel Fuhrman's books on The End of Dieting & The End of Diabetes  Grains Whole grain or whole wheat bread. Brown rice. Whole grain or whole wheat pasta. Quinoa, bulgur, and whole grain cereals. Low-sodium cereals. Corn or whole wheat flour tortillas. Whole grain cornbread. Whole grain crackers. Low-sodium crackers.  Vegetables Fresh or frozen vegetables (raw, steamed, roasted, or grilled). Low-sodium or reduced-sodium tomato and vegetable juices. Low-sodium or reduced-sodium tomato sauce and paste. Low-sodium or reduced-sodium canned vegetables.   Fruits All fresh, canned (in natural juice), or   frozen fruits.  Protein Products  All fish and seafood.  Dried beans, peas, or lentils. Unsalted nuts and seeds. Unsalted canned beans.  Dairy Low-fat dairy products, such as skim or 1% milk, 2% or reduced-fat cheeses, low-fat ricotta or cottage cheese, or plain low-fat yogurt. Low-sodium or reduced-sodium cheeses.  Fats and Oils Tub margarines without trans fats. Light or reduced-fat mayonnaise and salad dressings (reduced sodium). Avocado. Safflower, olive, or canola oils. Natural peanut or almond butter.  Other Unsalted popcorn and pretzels. The items listed above may not be a complete list of recommended foods or beverages. Contact your  dietitian for more options.  +++++++++++++++++++  WHAT FOODS ARE NOT RECOMMENDED? Grains/ White flour or wheat flour White bread. White pasta. White rice. Refined cornbread. Bagels and croissants. Crackers that contain trans fat.  Vegetables  Creamed or fried vegetables. Vegetables in a . Regular canned vegetables. Regular canned tomato sauce and paste. Regular tomato and vegetable juices.  Fruits Dried fruits. Canned fruit in light or heavy syrup. Fruit juice.  Meat and Other Protein Products Meat in general - RED meat & White meat.  Fatty cuts of meat. Ribs, chicken wings, all processed meats as bacon, sausage, bologna, salami, fatback, hot dogs, bratwurst and packaged luncheon meats.  Dairy Whole or 2% milk, cream, half-and-half, and cream cheese. Whole-fat or sweetened yogurt. Full-fat cheeses or blue cheese. Non-dairy creamers and whipped toppings. Processed cheese, cheese spreads, or cheese curds.  Condiments Onion and garlic salt, seasoned salt, table salt, and sea salt. Canned and packaged gravies. Worcestershire sauce. Tartar sauce. Barbecue sauce. Teriyaki sauce. Soy sauce, including reduced sodium. Steak sauce. Fish sauce. Oyster sauce. Cocktail sauce. Horseradish. Ketchup and mustard. Meat flavorings and tenderizers. Bouillon cubes. Hot sauce. Tabasco sauce. Marinades. Taco seasonings. Relishes.  Fats and Oils Butter, stick margarine, lard, shortening and bacon fat. Coconut, palm kernel, or palm oils. Regular salad dressings.  Pickles and olives. Salted popcorn and pretzels.  The items listed above may not be a complete list of foods and beverages to avoid.    

## 2019-05-09 NOTE — Progress Notes (Signed)
[facility]  [name], M.D.        Uvaldo Bristle. Silverio Lay, P.A.-C         Liane Comber, [place]                [address] [place], [place] [address] Telephone [phone] Telefax [phone] Annual  Screening/Preventative Visit  & Comprehensive Evaluation & Examination     This very nice 58 y.o. MWM  presents for a Screening /Preventative Visit & comprehensive evaluation and management of multiple medical co-morbidities.  Patient has been followed for HTN, HLD, Prediabetes and Vitamin D Deficiency. Patient also has OSA on CPAP with improved restorative sleep.      HTN predates since 2014. Patient's BP has been controlled at home.  Today's BP is at goal - 138/82. Patient denies any cardiac symptoms as chest pain, palpitations, shortness of breath, dizziness or ankle swelling.     Patient's hyperlipidemia is not controlled with diet and Atorvastatin  / Tricor. Patient denies myalgias or other medication SE's. Last lipids were not at goal: Lab Results  Component Value Date   CHOL 187 02/04/2019   HDL 46 02/04/2019   LDLCALC 114 (H) 02/04/2019   TRIG 156 (H) 02/04/2019   CHOLHDL 4.1 02/04/2019      Patient has hx/o prediabetes (A1c 5.8%  / 2014) and patient denies reactive hypoglycemic symptoms, visual blurring, diabetic polys or paresthesias. Last A1c was not at goal: Lab Results  Component Value Date   HGBA1C 5.9 (H) 02/04/2019       Finally, patient has history of Vitamin D Deficiency ("47" / 2014)  and last vitamin D was at goal: Lab Results  Component Value Date   VD25OH 89 10/26/2018   Current Outpatient Medications on File Prior to Visit  Medication Sig  . aspirin 81 MG tablet Take 81 mg by mouth daily.  Marland Kitchen atorvastatin (LIPITOR) 80 MG tablet Take 1 tablet Daily for Cholesterol  . bisoprolol-hydrochlorothiazide (ZIAC) 5-6.25 MG tablet TAKE 1 TABLET BY MOUTH DAILY FOR BLOOD  PRESSURE  . Cholecalciferol (VITAMIN D-3 PO) Take 10,000 Units by mouth daily.   . Cinnamon 500 MG capsule Take 500 mg by mouth 4 (four) times daily.  . clindamycin (CLINDAGEL) 1 % gel Apply topically 2 (two) times daily.  . fenofibrate (TRICOR) 145 MG tablet TAKE 1 TABLET(145 MG) BY MOUTH DAILY  . fluticasone (FLONASE) 50 MCG/ACT nasal spray Use 1 to 2 sprays each Nostril 1 to 2 x /day  . Multiple Vitamin (MULTIVITAMIN) tablet Take 1 tablet by mouth daily.  . Omega-3 Fatty Acids (FISH OIL PO) Take 2,400 mg by mouth 2 (two) times daily.   Marland Kitchen zinc gluconate 50 MG tablet Take 50 mg by mouth daily.   No current facility-administered medications on file prior to visit.    No Known Allergies   Past Medical History:  Diagnosis Date  . Anxiety   . Hyperlipidemia   . Hypertension   . Kidney stones   . OSA (obstructive sleep apnea)   . Other testicular hypofunction   . Unspecified vitamin D deficiency    Health Maintenance  Topic Date Due  . Hepatitis C Screening  April 24, 1961  . HIV Screening  06/23/1974  . INFLUENZA VACCINE  05/15/2019  . COLONOSCOPY  04/13/2020  . TETANUS/TDAP  01/16/2026   Immunization History  Administered Date(s) Administered  .  Influenza Inj Mdck Quad With Preservative 09/03/2017, 07/28/2018  . Influenza Split 08/23/2014  . Influenza Whole 08/09/2013  . Influenza, Seasonal, Injecte, Preservative Fre 07/30/2016  . PPD Test 08/23/2014, 10/18/2015, 01/29/2017, 04/09/2018  . Pneumococcal Conjugate-13 08/23/2014  . Tdap 01/17/2016   Last Colon - 04/13/2010 in [place], [place] 10 yr f/u due July 2021  Past Surgical History:  Procedure Laterality Date  . HERNIA REPAIR Left groin   1970  . HERNIA REPAIR Right groin   2011  . INCISION AND DRAINAGE PERIRECTAL ABSCESS    . MELANOMA EXCISION  2009  . NASAL SEPTUM SURGERY    . WISDOM TOOTH EXTRACTION  1976   Family History  Problem Relation Age of Onset  . Aneurysm Mother   . Kidney disease Father   .  Kidney failure Father    Social History   Socioeconomic History  . Marital status: Married    Spouse name: Anderson Malta  . Number of children: Not on file  Occupational History  . [employer] - Management of loan processing  Tobacco Use  . Smoking status: Never Smoker  . Smokeless tobacco: Never Used  Substance and Sexual Activity  . Alcohol use: No  . Drug use: No  . Sexual activity: Not on file    ROS Constitutional: Denies fever, chills, weight loss/gain, headaches, insomnia,  night sweats or change in appetite. Does c/o fatigue. Eyes: Denies redness, blurred vision, diplopia, discharge, itchy or watery eyes.  ENT: Denies discharge, congestion, post nasal drip, epistaxis, sore throat, earache, hearing loss, dental pain, Tinnitus, Vertigo, Sinus pain or snoring.  Cardio: Denies chest pain, palpitations, irregular heartbeat, syncope, dyspnea, diaphoresis, orthopnea, PND, claudication or edema Respiratory: denies cough, dyspnea, DOE, pleurisy, hoarseness, laryngitis or wheezing.  Gastrointestinal: Denies dysphagia, heartburn, reflux, water brash, pain, cramps, nausea, vomiting, bloating, diarrhea, constipation, hematemesis, melena, hematochezia, jaundice or hemorrhoids Genitourinary: Denies dysuria, frequency, discharge, hematuria or flank pain. Has urgency, nocturia x 2-3 & occasional hesitancy. Musculoskeletal: Denies arthralgia, myalgia, stiffness, Jt. Swelling, pain, limp or strain/sprain. Denies Falls. Skin: Denies puritis, rash, hives, warts, acne, eczema or change in skin lesion Neuro: No weakness, tremor, incoordination, spasms, paresthesia or pain Psychiatric: Denies confusion, memory loss or sensory loss. Denies Depression. Endocrine: Denies change in weight, skin, hair change, nocturia, and paresthesia, diabetic polys, visual blurring or hyper / hypo glycemic episodes.  Heme/Lymph: No excessive bleeding, bruising or enlarged lymph nodes.  Physical Exam  BP 138/82   Pulse  64   Temp (!) 97.2 F (36.2 C)   Resp 16   Ht 6' 0.5" (1.842 m)   Wt 210 lb 6.4 oz (95.4 kg)   BMI 28.14 kg/m   General Appearance: Well nourished and well groomed and in no apparent distress.  Eyes: PERRLA, EOMs, conjunctiva no swelling or erythema, normal fundi and vessels. Sinuses: No frontal/maxillary tenderness ENT/Mouth: EACs patent / TMs  nl. Nares clear without erythema, swelling, mucoid exudates. Oral hygiene is good. No erythema, swelling, or exudate. Tongue normal, non-obstructing. Tonsils not swollen or erythematous. Hearing normal.  Neck: Supple, thyroid not palpable. No bruits, nodes or JVD. Respiratory: Respiratory effort normal.  BS equal and clear bilateral without rales, rhonci, wheezing or stridor. Cardio: Heart sounds are normal with regular rate and rhythm and no murmurs, rubs or gallops. Peripheral pulses are normal and equal bilaterally without edema. No aortic or femoral bruits. Chest: symmetric with normal excursions and percussion.  Abdomen: Soft, with Nl bowel sounds. Nontender, no guarding, rebound, hernias, masses,  or organomegaly.  Lymphatics: Non tender without lymphadenopathy.  Musculoskeletal: Full ROM all peripheral extremities, joint stability, 5/5 strength, and normal gait. Skin: Warm and dry without rashes, lesions, cyanosis, clubbing or  ecchymosis.  Neuro: Cranial nerves intact, reflexes equal bilaterally. Normal muscle tone, no cerebellar symptoms. Sensation intact.  Pysch: Alert and oriented X 3 with normal affect, insight and judgment appropriate.   Assessment and Plan  1. Annual Preventative/Screening Exam   2. Essential hypertension  - EKG 12-Lead - [place], RETROPERITNL ABD,  LTD - Urinalysis, Routine w reflex microscopic - CBC with Differential/Platelet - COMPLETE METABOLIC PANEL WITH GFR - Magnesium - TSH - Microalbumin / creatinine urine ratio  3. Hyperlipidemia, mixed  - EKG 12-Lead - [place], RETROPERITNL ABD,  LTD - Lipid panel -  TSH  4. Abnormal glucose  - EKG 12-Lead - [place], RETROPERITNL ABD,  LTD - Hemoglobin A1c - Insulin, random  5. Vitamin D deficiency  - VITAMIN D 25 Hydroxyl  6. Prediabetes  - Hemoglobin A1c - Insulin, random  7. OSA on CPAP  8. Testosterone deficiency  - Testosterone  9. BPH with obstruction/lower urinary tract symptoms  - PSA  10. Prostate cancer screening  - PSA  11. Screening examination for pulmonary tuberculosis  - TB Skin Test  12. Screening for colorectal cancer  - POC Hemoccult Bld/Stl   13. Screening for ischemic heart disease  - EKG 12-Lead  14. FHx: heart disease  - EKG 12-Lead - [place], RETROPERITNL ABD,  LTD  15. Screening for AAA (aortic abdominal aneurysm)  - [place], RETROPERITNL ABD,  LTD  16. Fatigue  - Iron,Total/Total Iron Binding Cap - Vitamin B12 - Testosterone - CBC with Differential/Platelet - TSH  17. Medication management  - Urinalysis, Routine w reflex microscopic - CBC with Differential/Platelet - COMPLETE METABOLIC PANEL WITH GFR - Magnesium - Lipid panel - TSH - Hemoglobin A1c - Insulin, random - VITAMIN D 25 Hydroxyl - Microalbumin / creatinine urine ratio         Patient was counseled in prudent diet, weight control to achieve/maintain BMI less than 25, BP monitoring, regular exercise and medications as discussed.  Discussed med effects and SE's. Routine screening labs and tests as requested with regular follow-up as recommended. Over 40 minutes of exam, counseling, chart review and high complex critical decision making was performed  Kirtland Bouchard, MD

## 2019-05-10 ENCOUNTER — Encounter: Payer: Self-pay | Admitting: Internal Medicine

## 2019-05-10 ENCOUNTER — Other Ambulatory Visit: Payer: Self-pay

## 2019-05-10 ENCOUNTER — Ambulatory Visit: Payer: BC Managed Care – PPO | Admitting: Internal Medicine

## 2019-05-10 VITALS — BP 138/82 | HR 64 | Temp 97.2°F | Resp 16 | Ht 72.5 in | Wt 210.4 lb

## 2019-05-10 DIAGNOSIS — Z130 Encounter for screening for diseases of the blood and blood-forming organs and certain disorders involving the immune mechanism: Secondary | ICD-10-CM | POA: Diagnosis not present

## 2019-05-10 DIAGNOSIS — R350 Frequency of micturition: Secondary | ICD-10-CM

## 2019-05-10 DIAGNOSIS — Z79899 Other long term (current) drug therapy: Secondary | ICD-10-CM

## 2019-05-10 DIAGNOSIS — Z131 Encounter for screening for diabetes mellitus: Secondary | ICD-10-CM | POA: Diagnosis not present

## 2019-05-10 DIAGNOSIS — Z13220 Encounter for screening for lipoid disorders: Secondary | ICD-10-CM

## 2019-05-10 DIAGNOSIS — N401 Enlarged prostate with lower urinary tract symptoms: Secondary | ICD-10-CM | POA: Diagnosis not present

## 2019-05-10 DIAGNOSIS — Z8249 Family history of ischemic heart disease and other diseases of the circulatory system: Secondary | ICD-10-CM | POA: Diagnosis not present

## 2019-05-10 DIAGNOSIS — Z1211 Encounter for screening for malignant neoplasm of colon: Secondary | ICD-10-CM

## 2019-05-10 DIAGNOSIS — Z9989 Dependence on other enabling machines and devices: Secondary | ICD-10-CM

## 2019-05-10 DIAGNOSIS — G4733 Obstructive sleep apnea (adult) (pediatric): Secondary | ICD-10-CM

## 2019-05-10 DIAGNOSIS — R5383 Other fatigue: Secondary | ICD-10-CM

## 2019-05-10 DIAGNOSIS — I10 Essential (primary) hypertension: Secondary | ICD-10-CM | POA: Diagnosis not present

## 2019-05-10 DIAGNOSIS — E559 Vitamin D deficiency, unspecified: Secondary | ICD-10-CM

## 2019-05-10 DIAGNOSIS — Z0000 Encounter for general adult medical examination without abnormal findings: Secondary | ICD-10-CM

## 2019-05-10 DIAGNOSIS — Z111 Encounter for screening for respiratory tuberculosis: Secondary | ICD-10-CM | POA: Diagnosis not present

## 2019-05-10 DIAGNOSIS — R7303 Prediabetes: Secondary | ICD-10-CM

## 2019-05-10 DIAGNOSIS — Z1329 Encounter for screening for other suspected endocrine disorder: Secondary | ICD-10-CM | POA: Diagnosis not present

## 2019-05-10 DIAGNOSIS — Z0001 Encounter for general adult medical examination with abnormal findings: Secondary | ICD-10-CM

## 2019-05-10 DIAGNOSIS — E349 Endocrine disorder, unspecified: Secondary | ICD-10-CM

## 2019-05-10 DIAGNOSIS — Z1389 Encounter for screening for other disorder: Secondary | ICD-10-CM

## 2019-05-10 DIAGNOSIS — N138 Other obstructive and reflux uropathy: Secondary | ICD-10-CM

## 2019-05-10 DIAGNOSIS — Z136 Encounter for screening for cardiovascular disorders: Secondary | ICD-10-CM

## 2019-05-10 DIAGNOSIS — R7309 Other abnormal glucose: Secondary | ICD-10-CM

## 2019-05-10 DIAGNOSIS — E782 Mixed hyperlipidemia: Secondary | ICD-10-CM

## 2019-05-10 DIAGNOSIS — Z125 Encounter for screening for malignant neoplasm of prostate: Secondary | ICD-10-CM

## 2019-05-11 LAB — LIPID PANEL
Cholesterol: 193 mg/dL (ref <200)
HDL: 49 mg/dL (ref >=40)
LDL Cholesterol (Calc): 115 mg/dL (calc) — ABNORMAL HIGH
Non-HDL Cholesterol (Calc): 144 mg/dL (calc) — ABNORMAL HIGH (ref <130)
Total CHOL/HDL Ratio: 3.9 (calc) (ref <5.0)
Triglycerides: 175 mg/dL — ABNORMAL HIGH (ref <150)

## 2019-05-11 LAB — COMPLETE METABOLIC PANEL WITH GFR
AG Ratio: 1.9 (calc) (ref 1.0–2.5)
ALT: 23 U/L (ref 9–46)
AST: 22 U/L (ref 10–35)
Albumin: 4.8 g/dL (ref 3.6–5.1)
Alkaline phosphatase (APISO): 43 U/L (ref 35–144)
BUN/Creatinine Ratio: 25 (calc) — ABNORMAL HIGH (ref 6–22)
BUN: 27 mg/dL — ABNORMAL HIGH (ref 7–25)
CO2: 27 mmol/L (ref 20–32)
Calcium: 10.3 mg/dL (ref 8.6–10.3)
Chloride: 102 mmol/L (ref 98–110)
Creat: 1.1 mg/dL (ref 0.70–1.33)
GFR, Est African American: 85 mL/min/{1.73_m2} (ref >=60)
GFR, Est Non African American: 73 mL/min/{1.73_m2} (ref >=60)
Globulin: 2.5 g/dL (calc) (ref 1.9–3.7)
Glucose, Bld: 103 mg/dL — ABNORMAL HIGH (ref 65–99)
Potassium: 3.9 mmol/L (ref 3.5–5.3)
Sodium: 140 mmol/L (ref 135–146)
Total Bilirubin: 0.4 mg/dL (ref 0.2–1.2)
Total Protein: 7.3 g/dL (ref 6.1–8.1)

## 2019-05-11 LAB — PSA: PSA: 1.6 ng/mL (ref <=4.0)

## 2019-05-11 LAB — CBC WITH DIFFERENTIAL/PLATELET
Absolute Monocytes: 550 cells/uL (ref 200–950)
Basophils Absolute: 72 cells/uL (ref 0–200)
Basophils Relative: 1.3 %
Eosinophils Absolute: 61 cells/uL (ref 15–500)
Eosinophils Relative: 1.1 %
HCT: 44 % (ref 38.5–50.0)
Hemoglobin: 14.6 g/dL (ref 13.2–17.1)
Lymphs Abs: 1562 cells/uL (ref 850–3900)
MCH: 30.4 pg (ref 27.0–33.0)
MCHC: 33.2 g/dL (ref 32.0–36.0)
MCV: 91.5 fL (ref 80.0–100.0)
MPV: 9.5 fL (ref 7.5–12.5)
Monocytes Relative: 10 %
Neutro Abs: 3256 cells/uL (ref 1500–7800)
Neutrophils Relative %: 59.2 %
Platelets: 339 10*3/uL (ref 140–400)
RBC: 4.81 10*6/uL (ref 4.20–5.80)
RDW: 11.8 % (ref 11.0–15.0)
Total Lymphocyte: 28.4 %
WBC: 5.5 10*3/uL (ref 3.8–10.8)

## 2019-05-11 LAB — URINALYSIS, ROUTINE W REFLEX MICROSCOPIC
Bilirubin Urine: NEGATIVE
Glucose, UA: NEGATIVE
Hgb urine dipstick: NEGATIVE
Ketones, ur: NEGATIVE
Leukocytes,Ua: NEGATIVE
Nitrite: NEGATIVE
Protein, ur: NEGATIVE
Specific Gravity, Urine: 1.031 (ref 1.001–1.03)
pH: <=5 (ref 5.0–8.0)

## 2019-05-11 LAB — IRON, TOTAL/TOTAL IRON BINDING CAP
%SAT: 21 % (calc) (ref 20–48)
Iron: 91 ug/dL (ref 50–180)
TIBC: 439 mcg/dL (calc) — ABNORMAL HIGH (ref 250–425)

## 2019-05-11 LAB — MICROALBUMIN / CREATININE URINE RATIO
Creatinine, Urine: 183 mg/dL (ref 20–320)
Microalb Creat Ratio: 9 mcg/mg creat (ref <30)
Microalb, Ur: 1.7 mg/dL

## 2019-05-11 LAB — HEMOGLOBIN A1C
Hgb A1c MFr Bld: 5.7 % of total Hgb — ABNORMAL HIGH (ref <5.7)
Mean Plasma Glucose: 117 (calc)
eAG (mmol/L): 6.5 (calc)

## 2019-05-11 LAB — INSULIN, RANDOM: Insulin: 12.4 u[IU]/mL

## 2019-05-11 LAB — MAGNESIUM: Magnesium: 2 mg/dL (ref 1.5–2.5)

## 2019-05-11 LAB — TSH: TSH: 1.82 mIU/L (ref 0.40–4.50)

## 2019-05-11 LAB — VITAMIN D 25 HYDROXY (VIT D DEFICIENCY, FRACTURES): Vit D, 25-Hydroxy: 100 ng/mL (ref 30–100)

## 2019-05-11 LAB — TESTOSTERONE: Testosterone: 256 ng/dL (ref 250–827)

## 2019-05-11 LAB — VITAMIN B12: Vitamin B-12: 409 pg/mL (ref 200–1100)

## 2019-05-17 LAB — TB SKIN TEST
Induration: 0 mm
TB Skin Test: NEGATIVE

## 2019-05-25 ENCOUNTER — Other Ambulatory Visit: Payer: Self-pay

## 2019-05-25 DIAGNOSIS — Z1211 Encounter for screening for malignant neoplasm of colon: Secondary | ICD-10-CM

## 2019-05-25 DIAGNOSIS — Z1212 Encounter for screening for malignant neoplasm of rectum: Secondary | ICD-10-CM | POA: Diagnosis not present

## 2019-05-25 LAB — POC HEMOCCULT BLD/STL (HOME/3-CARD/SCREEN)
Card #2 Fecal Occult Blod, POC: NEGATIVE
Card #3 Fecal Occult Blood, POC: NEGATIVE
Fecal Occult Blood, POC: NEGATIVE

## 2019-07-09 ENCOUNTER — Other Ambulatory Visit: Payer: Self-pay | Admitting: Internal Medicine

## 2019-07-09 MED ORDER — BISOPROLOL-HYDROCHLOROTHIAZIDE 5-6.25 MG PO TABS: ORAL_TABLET | ORAL | 1 refills | Status: DC

## 2019-08-11 NOTE — Progress Notes (Signed)
FOLLOW UP  Assessment and Plan:   Hypertension Well controlled with current medications  Monitor blood pressure at home; patient to call if consistently greater than 130/80 Continue DASH diet.   Reminder to go to the ER if any CP, SOB, nausea, dizziness, severe HA, changes vision/speech, left arm numbness and tingling and jaw pain.  Cholesterol Currently above LDL goal; recently taking atorvastatin 40 mg daily, fenofibrate 145 mg daily Consider switch to rosuvastatin if above goal  Continue low cholesterol diet and exercise.  Check lipid panel.   Prediabetes Continue diet and exercise.  Perform daily foot/skin check, notify office of any concerning changes.  Check A1C  Overweight with co morbidities Long discussion about weight loss, diet, and exercise Recommended diet heavy in fruits and veggies and low in animal meats, cheeses, and dairy products, appropriate calorie intake Discussed ideal weight for height and initial weight goal (205 lb) Patient will work on increasing exercise, cut down on diet soda, increase water intake, reduce carbs, increase veggies/lean protein  Will follow up in 3 months  Vitamin D Def At goal at last visit; continue supplementation to maintain goal of 60-100 Defer Vit D level  Testosterone deficiency Denies symptoms; on zinc 50 mg daily supplement. Encouraged weight loss and HIIT exercise.   Continue diet and meds as discussed. Further disposition pending results of labs. Discussed med's effects and SE's.   Over 30 minutes of exam, counseling, chart review, and critical decision making was performed.   Future Appointments  Date Time Provider [place]  11/15/2019  3:30 PM [name], MD [facility] None  05/17/2020  2:00 PM [name], MD [facility] None    ----------------------------------------------------------------------------------------------------------------------  HPI 58 y.o. male  presents for 3 month follow up  on hypertension, cholesterol, prediabetes, weight and vitamin D deficiency.   He is working mainly from home due to covid 19.  He has OSA and is on CPAP and reports 100% compliance, reports initially perceived restorative sleep, still using machine from ? 2006. He would like to follow up at some point, but declines for now in light of pandemic. [facility] does provide new tubing periodically.   BMI is Body mass index is 28.49 kg/m., he has been working on diet and exercise, reports weights are up and down at home. He is trying to limit sweets and hamburgers, etc, trying to make better choices. Not eating out.  Initial weight goal 205 lb. Wt Readings from Last 3 Encounters:  08/12/19 213 lb (96.6 kg)  05/10/19 210 lb 6.4 oz (95.4 kg)  02/04/19 212 lb (96.2 kg)   His blood pressure has been controlled at home, today their BP is BP: 112/62  He does not workout. He denies chest pain, shortness of breath, dizziness.   He is on cholesterol medication (atorvastatin 40 mg daily since the last visit, fenofibrate 145 mg daily) and denies myalgias. His LDL cholesterol is not at goal; trigs remained elevated at last check. The cholesterol last visit was:   Lab Results  Component Value Date   CHOL 193 05/10/2019   HDL 49 05/10/2019   LDLCALC 115 (H) 05/10/2019   TRIG 175 (H) 05/10/2019   CHOLHDL 3.9 05/10/2019    He has not been working on diet and exercise for prediabetes, and denies foot ulcerations, hypoglycemia , increased appetite, nausea, paresthesia of the feet, polydipsia, polyuria, visual disturbances, vomiting and weight loss. Last A1C in the office was:  Lab Results  Component Value Date   HGBA1C 5.7 (H)  05/10/2019   Patient is on Vitamin D supplement and at goal at last check:   Lab Results  Component Value Date   VD25OH 100 05/10/2019     He has a history of testosterone deficiency, on zinc 50 mg supplement.  Denies notable symptoms of testosterone deficiency. Lab Results   Component Value Date   TESTOSTERONE 256 05/10/2019     Current Medications:  Current Outpatient Medications on File Prior to Visit  Medication Sig  . aspirin 81 MG tablet Take 81 mg by mouth daily.  Marland Kitchen atorvastatin (LIPITOR) 80 MG tablet Take 1 tablet Daily for Cholesterol  . bisoprolol-hydrochlorothiazide (ZIAC) 5-6.25 MG tablet Take 1 tablet Daily for BP  . Cholecalciferol (VITAMIN D-3 PO) Take 10,000 Units by mouth daily.   . Cinnamon 500 MG capsule Take 500 mg by mouth 4 (four) times daily.  . clindamycin (CLINDAGEL) 1 % gel Apply topically 2 (two) times daily.  . fenofibrate (TRICOR) 145 MG tablet TAKE 1 TABLET(145 MG) BY MOUTH DAILY  . fluticasone (FLONASE) 50 MCG/ACT nasal spray Use 1 to 2 sprays each Nostril 1 to 2 x /day  . Multiple Vitamin (MULTIVITAMIN) tablet Take 1 tablet by mouth daily.  . Omega-3 Fatty Acids (FISH OIL PO) Take 2,400 mg by mouth 2 (two) times daily.   Marland Kitchen zinc gluconate 50 MG tablet Take 50 mg by mouth daily.   No current facility-administered medications on file prior to visit.      Allergies: No Known Allergies   Medical History:  Past Medical History:  Diagnosis Date  . Anxiety   . Hyperlipidemia   . Hypertension   . Kidney stones   . OSA (obstructive sleep apnea)   . Other testicular hypofunction   . Unspecified vitamin D deficiency    Family history- Reviewed and unchanged Social history- Reviewed and unchanged   Review of Systems:  Review of Systems  Constitutional: Negative for malaise/fatigue and weight loss.  HENT: Negative for hearing loss and tinnitus.   Eyes: Negative for blurred vision and double vision.  Respiratory: Negative for cough, shortness of breath and wheezing.   Cardiovascular: Negative for chest pain, palpitations, orthopnea, claudication and leg swelling.  Gastrointestinal: Negative for abdominal pain, blood in stool, constipation, diarrhea, heartburn, melena, nausea and vomiting.  Genitourinary: Negative.    Musculoskeletal: Negative for joint pain and myalgias.  Skin: Negative for rash.  Neurological: Negative for dizziness, tingling, sensory change, weakness and headaches.  Endo/Heme/Allergies: Negative for polydipsia.  Psychiatric/Behavioral: Negative.   All other systems reviewed and are negative.   Physical Exam: BP 112/62   Pulse 66   Temp (!) 97.5 F (36.4 C)   Ht 6' 0.5" (1.842 m)   Wt 213 lb (96.6 kg)   SpO2 97%   BMI 28.49 kg/m  Wt Readings from Last 3 Encounters:  08/12/19 213 lb (96.6 kg)  05/10/19 210 lb 6.4 oz (95.4 kg)  02/04/19 212 lb (96.2 kg)   General Appearance: Well nourished, in no apparent distress. Eyes: PERRLA, EOMs, conjunctiva no swelling or erythema Sinuses: No Frontal/maxillary tenderness ENT/Mouth: Ext aud canals clear, TMs without erythema, bulging. No erythema, swelling, or exudate on post pharynx.  Tonsils not swollen or erythematous. Hearing normal.  Neck: Supple, thyroid normal.  Respiratory: Respiratory effort normal, BS equal bilaterally without rales, rhonchi, wheezing or stridor.  Cardio: RRR with no MRGs. Brisk peripheral pulses without edema.  Abdomen: Soft, + BS.  Non tender, no guarding, rebound, hernias, masses. Lymphatics: Non tender without lymphadenopathy.  Musculoskeletal: Full ROM, 5/5 strength, Normal gait Skin: Warm, dry without rashes, lesions, ecchymosis.  Neuro: Cranial nerves intact. No cerebellar symptoms.  Psych: Awake and oriented X 3, normal affect, Insight and Judgment appropriate.    Izora Ribas, NP 3:54 PM [facility]

## 2019-08-12 ENCOUNTER — Encounter: Payer: Self-pay | Admitting: Adult Health

## 2019-08-12 ENCOUNTER — Other Ambulatory Visit: Payer: Self-pay

## 2019-08-12 ENCOUNTER — Ambulatory Visit: Payer: BC Managed Care – PPO | Admitting: Adult Health

## 2019-08-12 VITALS — BP 112/62 | HR 66 | Temp 97.5°F | Ht 72.5 in | Wt 213.0 lb

## 2019-08-12 DIAGNOSIS — R7309 Other abnormal glucose: Secondary | ICD-10-CM | POA: Diagnosis not present

## 2019-08-12 DIAGNOSIS — R7303 Prediabetes: Secondary | ICD-10-CM

## 2019-08-12 DIAGNOSIS — E663 Overweight: Secondary | ICD-10-CM

## 2019-08-12 DIAGNOSIS — E782 Mixed hyperlipidemia: Secondary | ICD-10-CM

## 2019-08-12 DIAGNOSIS — Z79899 Other long term (current) drug therapy: Secondary | ICD-10-CM | POA: Diagnosis not present

## 2019-08-12 DIAGNOSIS — E559 Vitamin D deficiency, unspecified: Secondary | ICD-10-CM

## 2019-08-12 DIAGNOSIS — G4733 Obstructive sleep apnea (adult) (pediatric): Secondary | ICD-10-CM | POA: Diagnosis not present

## 2019-08-12 DIAGNOSIS — I10 Essential (primary) hypertension: Secondary | ICD-10-CM | POA: Diagnosis not present

## 2019-08-12 DIAGNOSIS — E349 Endocrine disorder, unspecified: Secondary | ICD-10-CM

## 2019-08-12 DIAGNOSIS — Z9989 Dependence on other enabling machines and devices: Secondary | ICD-10-CM

## 2019-08-12 NOTE — Patient Instructions (Addendum)
Goals    . DIET - INCREASE WATER INTAKE     Cut down on diet soda    . Exercise 150 min/wk Moderate Activity    . HEMOGLOBIN A1C < 5.7    . Weight (lb) < 205 lb (93 kg)       General weight loss tips that don't involve counting calories:    Drink 1/2 your body weight in fluid ounces of water daily; drink a tall glass of water 30 min before meals  Don't eat until you're stuffed- listen to your stomach and eat until you are 80% full   Try eating off of a salad plate; wait 10 min after finishing before going back for seconds  Start by eating the vegetables on your plate; aim for 50% of your meals to be fruits or vegetables  Then eat your protein - lean meats (grass fed if possible), fish, beans, nuts in moderation  Eat your carbs/starch last ONLY if you still are hungry. If you can, stop before finishing it all  Avoid sugar and flour - the closer it looks to it's original form in nature, typically the better it is for you  Splurge in moderation - "assign" days when you get to splurge and have the "bad stuff" - I like to follow a 80% - 20% plan- "good" choices 80 % of the time, "bad" choices in moderation 20% of the time  Simple equation is: Calories out > calories in = weight loss - even if you eat the bad stuff, if you limit portions, you will still lose weight     Diet soda leads to weight gain.  We recently discovered that the artifical sugar in the soda stops an enzyme in your stomach that is suppose to signal that your brain is full. So patients that drink a lot of diet soda will never feel full and tend to over eat. So please cut back on diet soda and it can help with your weight loss.   A study found that two or more sodas a day increased the risk of hypertension, diabetes, stroke.   Another recent study found that higher consumption of soda and juice was associated with increased risk of cancer and breast cancer.   In animal studies artifical sweeteners affect the gut  bacteria and may contribute to chronic disease this way.     Covid 19 drive through  The testing sites are open from 8-4, Monday-Friday. Due to the testing being walk-up/drive-up the sites request that the pt's are in line to have testing by 3:30 pm. The pt's will remain in the car and wear a mask when going for testing.The staff will come to the car to perform testing. The pt's will need to bring an ID and insurance card if they have one.   [place] ([place])  [facility]   The testing sites are also listed on the [employer] as well.

## 2019-08-13 LAB — COMPLETE METABOLIC PANEL WITH GFR
AG Ratio: 2.1 (calc) (ref 1.0–2.5)
ALT: 27 U/L (ref 9–46)
AST: 23 U/L (ref 10–35)
Albumin: 4.5 g/dL (ref 3.6–5.1)
Alkaline phosphatase (APISO): 41 U/L (ref 35–144)
BUN/Creatinine Ratio: 26 (calc) — ABNORMAL HIGH (ref 6–22)
BUN: 28 mg/dL — ABNORMAL HIGH (ref 7–25)
CO2: 32 mmol/L (ref 20–32)
Calcium: 10.3 mg/dL (ref 8.6–10.3)
Chloride: 103 mmol/L (ref 98–110)
Creat: 1.06 mg/dL (ref 0.70–1.25)
GFR, Est African American: 88 mL/min/{1.73_m2} (ref >=60)
GFR, Est Non African American: 76 mL/min/{1.73_m2} (ref >=60)
Globulin: 2.1 g/dL (calc) (ref 1.9–3.7)
Glucose, Bld: 130 mg/dL — ABNORMAL HIGH (ref 65–99)
Potassium: 4.2 mmol/L (ref 3.5–5.3)
Sodium: 142 mmol/L (ref 135–146)
Total Bilirubin: 0.5 mg/dL (ref 0.2–1.2)
Total Protein: 6.6 g/dL (ref 6.1–8.1)

## 2019-08-13 LAB — CBC WITH DIFFERENTIAL/PLATELET
Absolute Monocytes: 594 cells/uL (ref 200–950)
Basophils Absolute: 62 cells/uL (ref 0–200)
Basophils Relative: 1.1 %
Eosinophils Absolute: 112 cells/uL (ref 15–500)
Eosinophils Relative: 2 %
HCT: 41.6 % (ref 38.5–50.0)
Hemoglobin: 14 g/dL (ref 13.2–17.1)
Lymphs Abs: 1523 cells/uL (ref 850–3900)
MCH: 30.1 pg (ref 27.0–33.0)
MCHC: 33.7 g/dL (ref 32.0–36.0)
MCV: 89.5 fL (ref 80.0–100.0)
MPV: 9.7 fL (ref 7.5–12.5)
Monocytes Relative: 10.6 %
Neutro Abs: 3310 cells/uL (ref 1500–7800)
Neutrophils Relative %: 59.1 %
Platelets: 347 10*3/uL (ref 140–400)
RBC: 4.65 10*6/uL (ref 4.20–5.80)
RDW: 11.8 % (ref 11.0–15.0)
Total Lymphocyte: 27.2 %
WBC: 5.6 10*3/uL (ref 3.8–10.8)

## 2019-08-13 LAB — LIPID PANEL
Cholesterol: 161 mg/dL (ref <200)
HDL: 46 mg/dL (ref >=40)
LDL Cholesterol (Calc): 89 mg/dL (calc)
Non-HDL Cholesterol (Calc): 115 mg/dL (calc) (ref <130)
Total CHOL/HDL Ratio: 3.5 (calc) (ref <5.0)
Triglycerides: 161 mg/dL — ABNORMAL HIGH (ref <150)

## 2019-08-13 LAB — HEMOGLOBIN A1C
Hgb A1c MFr Bld: 5.6 % of total Hgb (ref <5.7)
Mean Plasma Glucose: 114 (calc)
eAG (mmol/L): 6.3 (calc)

## 2019-08-13 LAB — MAGNESIUM: Magnesium: 2.2 mg/dL (ref 1.5–2.5)

## 2019-08-13 LAB — TSH: TSH: 1.61 mIU/L (ref 0.40–4.50)

## 2019-10-20 ENCOUNTER — Other Ambulatory Visit: Payer: Self-pay | Admitting: Adult Health

## 2019-10-20 DIAGNOSIS — E782 Mixed hyperlipidemia: Secondary | ICD-10-CM

## 2019-10-20 MED ORDER — ATORVASTATIN CALCIUM 40 MG PO TABS: ORAL_TABLET | ORAL | 1 refills | Status: DC

## 2019-11-14 ENCOUNTER — Encounter: Payer: Self-pay | Admitting: Internal Medicine

## 2019-11-14 NOTE — Progress Notes (Signed)
History of Present Illness:      This very nice 59 y.o. MWM presents for 6 month follow up with HTN, HLD, Pre-Diabetes and Vitamin D Deficiency. Patient is on CPAP for OSA with improved sleep hygiene.      Patient is treated for HTN (2014) & BP has been controlled at home. Today's BP is at goal - 130/82. Patient has had no complaints of any cardiac type chest pain, palpitations, dyspnea / orthopnea / PND, dizziness, claudication, or dependent edema.     Hyperlipidemia is controlled with diet & meds. Patient denies myalgias or other med SE's. Last Lipids were at goal except elevated Triglycerides:  Lab Results  Component Value Date   CHOL 161 08/12/2019   HDL 46 08/12/2019   LDLCALC 89 08/12/2019   TRIG 161 (H) 08/12/2019   CHOLHDL 3.5 08/12/2019        Also, the patient has history of PreDiabetes (A1c 5.8% / 2014 and 5.9% in Mar 2020) and has had no symptoms of reactive hypoglycemia, diabetic polys, paresthesias or visual blurring.  Last A1c was Normal & at goal:  Lab Results  Component Value Date   HGBA1C 5.6 08/12/2019        Further, the patient also has history of Vitamin D Deficiency ("47" / 2014)  and supplements vitamin D without any suspected side-effects. Last vitamin D was at goal:  Lab Results  Component Value Date   VD25OH 100 05/10/2019    Current Outpatient Medications on File Prior to Visit  Medication Sig  . aspirin 81 MG tablet Take 81 mg by mouth daily.  Marland Kitchen atorvastatin (LIPITOR) 40 MG tablet Take 1 tablet Daily for Cholesterol  . bisoprolol-hydrochlorothiazide (ZIAC) 5-6.25 MG tablet Take 1 tablet Daily for BP  . Cholecalciferol (VITAMIN D-3 PO) Take 10,000 Units by mouth daily.   . Cinnamon 500 MG capsule Take 500 mg by mouth 4 (four) times daily.  . clindamycin (CLINDAGEL) 1 % gel Apply topically 2 (two) times daily.  . fenofibrate (TRICOR) 145 MG tablet TAKE 1 TABLET(145 MG) BY MOUTH DAILY  . fluticasone (FLONASE) 50 MCG/ACT nasal spray Use 1 to  2 sprays each Nostril 1 to 2 x /day  . Multiple Vitamin (MULTIVITAMIN) tablet Take 1 tablet by mouth daily.  . Omega-3 Fatty Acids (FISH OIL PO) Take 2,400 mg by mouth 2 (two) times daily.   Marland Kitchen zinc gluconate 50 MG tablet Take 50 mg by mouth daily.   No current facility-administered medications on file prior to visit.    No Known Allergies  PMHx:   Past Medical History:  Diagnosis Date  . Anxiety   . Hyperlipidemia   . Hypertension   . Kidney stones   . OSA (obstructive sleep apnea)   . Other testicular hypofunction   . Unspecified vitamin D deficiency    Immunization History  Administered Date(s) Administered  . Influenza Inj Mdck Quad With Preservative 09/03/2017, 07/28/2018  . Influenza Split 08/23/2014  . Influenza Whole 08/09/2013  . Influenza, Seasonal, Injecte, Preservative Fre 07/30/2016  . Influenza-Unspecified 08/01/2019  . PPD Test 08/23/2014, 10/18/2015, 01/29/2017, 04/09/2018, 05/10/2019  . Pneumococcal Conjugate-13 08/23/2014  . Tdap 01/17/2016  . Zoster Recombinat (Shingrix) 09/12/2019   Past Surgical History:  Procedure Laterality Date  . HERNIA REPAIR Left groin   1970  . HERNIA REPAIR Right groin   2011  . INCISION AND DRAINAGE PERIRECTAL ABSCESS    . MELANOMA EXCISION  2009  . NASAL SEPTUM SURGERY    .  WISDOM TOOTH EXTRACTION  1976    FHx:    Reviewed / unchanged  SHx:    Reviewed / unchanged   Systems Review:  Constitutional: Denies fever, chills, wt changes, headaches, insomnia, fatigue, night sweats, change in appetite. Eyes: Denies redness, blurred vision, diplopia, discharge, itchy, watery eyes.  ENT: Denies discharge, congestion, post nasal drip, epistaxis, sore throat, earache, hearing loss, dental pain, tinnitus, vertigo, sinus pain, snoring.  CV: Denies chest pain, palpitations, irregular heartbeat, syncope, dyspnea, diaphoresis, orthopnea, PND, claudication or edema. Respiratory: denies cough, dyspnea, DOE, pleurisy, hoarseness,  laryngitis, wheezing.  Gastrointestinal: Denies dysphagia, odynophagia, heartburn, reflux, water brash, abdominal pain or cramps, nausea, vomiting, bloating, diarrhea, constipation, hematemesis, melena, hematochezia  or hemorrhoids. Genitourinary: Denies dysuria, frequency, urgency, nocturia, hesitancy, discharge, hematuria or flank pain. Musculoskeletal: Denies arthralgias, myalgias, stiffness, jt. swelling, pain, limping or strain/sprain.  Skin: Denies pruritus, rash, hives, warts, acne, eczema or change in skin lesion(s). Neuro: No weakness, tremor, incoordination, spasms, paresthesia or pain. Psychiatric: Denies confusion, memory loss or sensory loss. Endo: Denies change in weight, skin or hair change.  Heme/Lymph: No excessive bleeding, bruising or enlarged lymph nodes.  Physical Exam  BP 130/82   Pulse 72   Temp (!) 97.4 F (36.3 C)   Resp 16   Ht 6' 0.5" (1.842 m)   Wt 211 lb 6.4 oz (95.9 kg)   BMI 28.28 kg/m   Appears  well nourished, well groomed  and in no distress.  Eyes: PERRLA, EOMs, conjunctiva no swelling or erythema. Sinuses: No frontal/maxillary tenderness ENT/Mouth: EAC's clear, TM's nl w/o erythema, bulging. Nares clear w/o erythema, swelling, exudates. Oropharynx clear without erythema or exudates. Oral hygiene is good. Tongue normal, non obstructing. Hearing intact.  Neck: Supple. Thyroid not palpable. Car 2+/2+ without bruits, nodes or JVD. Chest: Respirations nl with BS clear & equal w/o rales, rhonchi, wheezing or stridor.  Cor: Heart sounds normal w/ regular rate and rhythm without sig. murmurs, gallops, clicks or rubs. Peripheral pulses normal and equal  without edema.  Abdomen: Soft & bowel sounds normal. Non-tender w/o guarding, rebound, hernias, masses or organomegaly.  Lymphatics: Unremarkable.  Musculoskeletal: Full ROM all peripheral extremities, joint stability, 5/5 strength and normal gait.  Skin: Warm, dry without exposed rashes, lesions or  ecchymosis apparent.  Neuro: Cranial nerves intact, reflexes equal bilaterally. Sensory-motor testing grossly intact. Tendon reflexes grossly intact.  Pysch: Alert & oriented x 3.  Insight and judgement nl & appropriate. No ideations.  Assessment and Plan:  1. Essential hypertension  - Continue medication, monitor blood pressure at home.  - Continue DASH diet.  Reminder to go to the ER if any CP,  SOB, nausea, dizziness, severe HA, changes vision/speech.  - CBC with Differential/Platelet - COMPLETE METABOLIC PANEL WITH GFR - Magnesium - TSH  2. Hyperlipidemia, mixed  - Continue diet/meds, exercise,& lifestyle modifications.  - Continue monitor periodic cholesterol/liver & renal functions   - Lipid panel - TSH  3. Abnormal glucose  - Continue diet, exercise  - Lifestyle modifications.  - Monitor appropriate labs.  - Hemoglobin A1c - Insulin, random  4. Vitamin D deficiency  - Continue supplementation.  - VITAMIN D 25 Hydroxy   5. OSA on CPAP   6. Medication management  - CBC with Differential/Platelet - COMPLETE METABOLIC PANEL WITH GFR - Magnesium - Lipid panel - TSH - Hemoglobin A1c - Insulin, random - VITAMIN D 25 Hydroxy         Discussed  regular exercise, BP monitoring, weight  control to achieve/maintain BMI less than 25 and discussed med and SE's. Recommended labs to assess and monitor clinical status with further disposition pending results of labs.  I discussed the assessment and treatment plan with the patient. The patient was provided an opportunity to ask questions and all were answered. The patient agreed with the plan and demonstrated an understanding of the instructions.  I provided over 30 minutes of exam, counseling, chart review and  complex critical decision making.  Kirtland Bouchard, MD

## 2019-11-14 NOTE — Patient Instructions (Signed)

## 2019-11-15 ENCOUNTER — Other Ambulatory Visit: Payer: Self-pay

## 2019-11-15 ENCOUNTER — Ambulatory Visit: Payer: BC Managed Care – PPO | Admitting: Internal Medicine

## 2019-11-15 VITALS — BP 130/82 | HR 72 | Temp 97.4°F | Resp 16 | Ht 72.5 in | Wt 211.4 lb

## 2019-11-15 DIAGNOSIS — Z79899 Other long term (current) drug therapy: Secondary | ICD-10-CM | POA: Diagnosis not present

## 2019-11-15 DIAGNOSIS — I10 Essential (primary) hypertension: Secondary | ICD-10-CM | POA: Diagnosis not present

## 2019-11-15 DIAGNOSIS — R7309 Other abnormal glucose: Secondary | ICD-10-CM | POA: Diagnosis not present

## 2019-11-15 DIAGNOSIS — E782 Mixed hyperlipidemia: Secondary | ICD-10-CM | POA: Diagnosis not present

## 2019-11-15 DIAGNOSIS — E559 Vitamin D deficiency, unspecified: Secondary | ICD-10-CM | POA: Diagnosis not present

## 2019-11-15 DIAGNOSIS — G4733 Obstructive sleep apnea (adult) (pediatric): Secondary | ICD-10-CM

## 2019-11-15 DIAGNOSIS — Z9989 Dependence on other enabling machines and devices: Secondary | ICD-10-CM

## 2019-11-15 MED ORDER — MUPIROCIN CALCIUM 2 % EX CREA: TOPICAL_CREAM | CUTANEOUS | 3 refills | Status: DC

## 2019-11-16 LAB — COMPLETE METABOLIC PANEL WITH GFR
AG Ratio: 2.1 (calc) (ref 1.0–2.5)
ALT: 30 U/L (ref 9–46)
AST: 25 U/L (ref 10–35)
Albumin: 4.7 g/dL (ref 3.6–5.1)
Alkaline phosphatase (APISO): 39 U/L (ref 35–144)
BUN/Creatinine Ratio: 23 (calc) — ABNORMAL HIGH (ref 6–22)
BUN: 26 mg/dL — ABNORMAL HIGH (ref 7–25)
CO2: 30 mmol/L (ref 20–32)
Calcium: 10.4 mg/dL — ABNORMAL HIGH (ref 8.6–10.3)
Chloride: 103 mmol/L (ref 98–110)
Creat: 1.14 mg/dL (ref 0.70–1.25)
GFR, Est African American: 81 mL/min/{1.73_m2} (ref >=60)
GFR, Est Non African American: 70 mL/min/{1.73_m2} (ref >=60)
Globulin: 2.2 g/dL (calc) (ref 1.9–3.7)
Glucose, Bld: 114 mg/dL — ABNORMAL HIGH (ref 65–99)
Potassium: 4.5 mmol/L (ref 3.5–5.3)
Sodium: 140 mmol/L (ref 135–146)
Total Bilirubin: 0.4 mg/dL (ref 0.2–1.2)
Total Protein: 6.9 g/dL (ref 6.1–8.1)

## 2019-11-16 LAB — LIPID PANEL
Cholesterol: 173 mg/dL (ref <200)
HDL: 46 mg/dL (ref >=40)
LDL Cholesterol (Calc): 101 mg/dL (calc) — ABNORMAL HIGH
Non-HDL Cholesterol (Calc): 127 mg/dL (calc) (ref <130)
Total CHOL/HDL Ratio: 3.8 (calc) (ref <5.0)
Triglycerides: 160 mg/dL — ABNORMAL HIGH (ref <150)

## 2019-11-16 LAB — CBC WITH DIFFERENTIAL/PLATELET
Absolute Monocytes: 673 cells/uL (ref 200–950)
Basophils Absolute: 70 cells/uL (ref 0–200)
Basophils Relative: 1.2 %
Eosinophils Absolute: 122 cells/uL (ref 15–500)
Eosinophils Relative: 2.1 %
HCT: 43.1 % (ref 38.5–50.0)
Hemoglobin: 14.6 g/dL (ref 13.2–17.1)
Lymphs Abs: 1607 cells/uL (ref 850–3900)
MCH: 30.4 pg (ref 27.0–33.0)
MCHC: 33.9 g/dL (ref 32.0–36.0)
MCV: 89.6 fL (ref 80.0–100.0)
MPV: 9.6 fL (ref 7.5–12.5)
Monocytes Relative: 11.6 %
Neutro Abs: 3329 cells/uL (ref 1500–7800)
Neutrophils Relative %: 57.4 %
Platelets: 328 10*3/uL (ref 140–400)
RBC: 4.81 10*6/uL (ref 4.20–5.80)
RDW: 11.7 % (ref 11.0–15.0)
Total Lymphocyte: 27.7 %
WBC: 5.8 10*3/uL (ref 3.8–10.8)

## 2019-11-16 LAB — HEMOGLOBIN A1C
Hgb A1c MFr Bld: 6 % of total Hgb — ABNORMAL HIGH (ref <5.7)
Mean Plasma Glucose: 126 (calc)
eAG (mmol/L): 7 (calc)

## 2019-11-16 LAB — VITAMIN D 25 HYDROXY (VIT D DEFICIENCY, FRACTURES): Vit D, 25-Hydroxy: 72 ng/mL (ref 30–100)

## 2019-11-16 LAB — MAGNESIUM: Magnesium: 2.2 mg/dL (ref 1.5–2.5)

## 2019-11-16 LAB — TSH: TSH: 1.82 mIU/L (ref 0.40–4.50)

## 2019-11-16 LAB — INSULIN, RANDOM: Insulin: 26.2 u[IU]/mL — ABNORMAL HIGH

## 2020-01-19 ENCOUNTER — Other Ambulatory Visit: Payer: Self-pay | Admitting: Internal Medicine

## 2020-01-19 DIAGNOSIS — I10 Essential (primary) hypertension: Secondary | ICD-10-CM

## 2020-01-19 MED ORDER — BISOPROLOL-HYDROCHLOROTHIAZIDE 5-6.25 MG PO TABS: ORAL_TABLET | ORAL | 0 refills | Status: DC

## 2020-02-14 ENCOUNTER — Ambulatory Visit: Payer: BC Managed Care – PPO | Admitting: Physician Assistant

## 2020-02-24 ENCOUNTER — Ambulatory Visit: Payer: BC Managed Care – PPO | Admitting: Physician Assistant

## 2020-03-02 NOTE — Progress Notes (Signed)
FOLLOW UP  Assessment and Plan:   Hypertension Well controlled with current medications  Monitor blood pressure at home; patient to call if consistently greater than 130/80 Continue DASH diet.   Can get CXR in future- no symptoms at this time Reminder to go to the ER if any CP, SOB, nausea, dizziness, severe HA, changes vision/speech, left arm numbness and tingling and jaw pain.  Cholesterol Currently above LDL goal; recently taking atorvastatin 40 mg daily, fenofibrate 145 mg daily Consider switch to rosuvastatin if above goal  Continue low cholesterol diet and exercise.  Check lipid panel.   Abnormal glucose Continue diet and exercise.  Perform daily foot/skin check, notify office of any concerning changes.  Check A1C  Overweight with co morbidities Long discussion about weight loss, diet, and exercise Recommended diet heavy in fruits and veggies and low in animal meats, cheeses, and dairy products, appropriate calorie intake Discussed ideal weight for height and initial weight goal (205 lb) Patient will work on increasing exercise, cut down on diet soda, increase water intake, reduce carbs, increase veggies/lean protein  Will follow up in 3 months  Vitamin D Def At goal at last visit; continue supplementation to maintain goal of 60-100  Testosterone deficiency Denies symptoms; on zinc 50 mg daily supplement. Encouraged weight loss and HIIT exercise.   Continue diet and meds as discussed. Further disposition pending results of labs. Discussed med's effects and SE's.   Over 30 minutes of exam, counseling, chart review, and critical decision making was performed.   Future Appointments  Date Time Provider [place]  05/17/2020  2:00 PM [name], MD [facility] None    ----------------------------------------------------------------------------------------------------------------------  HPI 59 y.o. male  presents for 3 month follow up on hypertension,  cholesterol, prediabetes, weight and vitamin D deficiency.   He is working mainly from home due to covid 19. A few days as needed. He has been vaccinated.   He has toenail that is very thick and has one place on this knee that is always scaly.   He has OSA and is on CPAP and reports 100% compliance, reports initially perceived restorative sleep, still using machine from 2006. He would like to follow up at some point, but declines for now in light of pandemic. [facility] does provide new tubing periodically.   BMI is Body mass index is 27.96 kg/m., he has been working on diet and exercise, reports weights are up and down at home.   Initial weight goal 205 lb. Wt Readings from Last 3 Encounters:  03/06/20 209 lb (94.8 kg)  11/15/19 211 lb 6.4 oz (95.9 kg)  08/12/19 213 lb (96.6 kg)   His blood pressure has been controlled at home on ziac 5 mg, today their BP is BP: 120/72  He does not workout. He denies chest pain, shortness of breath, dizziness.   He is on cholesterol medication (atorvastatin 40 mg daily since the last visit, fenofibrate 145 mg daily) and denies myalgias. His LDL cholesterol is not at goal; trigs remained elevated at last check. The cholesterol last visit was:   Lab Results  Component Value Date   CHOL 173 11/15/2019   HDL 46 11/15/2019   LDLCALC 101 (H) 11/15/2019   TRIG 160 (H) 11/15/2019   CHOLHDL 3.8 11/15/2019    He has not been working on diet and exercise for prediabetes, and denies foot ulcerations, hypoglycemia , increased appetite, nausea, paresthesia of the feet, polydipsia, polyuria, visual disturbances, vomiting and weight loss. Last A1C in the  office was:  Lab Results  Component Value Date   HGBA1C 6.0 (H) 11/15/2019   Patient is on Vitamin D supplement and at goal at last check:   Lab Results  Component Value Date   VD25OH 72 11/15/2019     He has a history of testosterone deficiency, on zinc 50 mg supplement.  Denies notable symptoms of  testosterone deficiency. Lab Results  Component Value Date   TESTOSTERONE 256 05/10/2019     Current Medications:  Current Outpatient Medications on File Prior to Visit  Medication Sig  . aspirin 81 MG tablet Take 81 mg by mouth daily.  Marland Kitchen atorvastatin (LIPITOR) 40 MG tablet Take 1 tablet Daily for Cholesterol  . bisoprolol-hydrochlorothiazide (ZIAC) 5-6.25 MG tablet Take 1 tablet Daily for BP  . Cholecalciferol (VITAMIN D-3 PO) Take 10,000 Units by mouth daily.   . Cinnamon 500 MG capsule Take 500 mg by mouth 4 (four) times daily.  . clindamycin (CLINDAGEL) 1 % gel Apply topically 2 (two) times daily.  . fenofibrate (TRICOR) 145 MG tablet TAKE 1 TABLET(145 MG) BY MOUTH DAILY  . fluticasone (FLONASE) 50 MCG/ACT nasal spray Use 1 to 2 sprays each Nostril 1 to 2 x /day  . Multiple Vitamin (MULTIVITAMIN) tablet Take 1 tablet by mouth daily.  . mupirocin cream (BACTROBAN) 2 % Apply to affected area 3 times daily  . Omega-3 Fatty Acids (FISH OIL PO) Take 2,400 mg by mouth 2 (two) times daily.   Marland Kitchen zinc gluconate 50 MG tablet Take 50 mg by mouth daily.   No current facility-administered medications on file prior to visit.     Allergies: No Known Allergies   Medical History:  Past Medical History:  Diagnosis Date  . Anxiety   . Hyperlipidemia   . Hypertension   . Kidney stones   . OSA (obstructive sleep apnea)   . Other testicular hypofunction   . Unspecified vitamin D deficiency    Family history- Reviewed and unchanged Social history- Reviewed and unchanged   Review of Systems:  Review of Systems  Constitutional: Negative for malaise/fatigue and weight loss.  HENT: Negative for hearing loss and tinnitus.   Eyes: Negative for blurred vision and double vision.  Respiratory: Negative for cough, shortness of breath and wheezing.   Cardiovascular: Negative for chest pain, palpitations, orthopnea, claudication and leg swelling.  Gastrointestinal: Negative for abdominal pain,  blood in stool, constipation, diarrhea, heartburn, melena, nausea and vomiting.  Genitourinary: Negative.   Musculoskeletal: Negative for joint pain and myalgias.  Skin: Negative for rash.  Neurological: Negative for dizziness, tingling, sensory change, weakness and headaches.  Endo/Heme/Allergies: Negative for polydipsia.  Psychiatric/Behavioral: Negative.   All other systems reviewed and are negative.   Physical Exam: BP 120/72   Pulse (!) 56   Temp (!) 97 F (36.1 C)   Ht 6' 0.5" (1.842 m)   Wt 209 lb (94.8 kg)   SpO2 98%   BMI 27.96 kg/m  Wt Readings from Last 3 Encounters:  03/06/20 209 lb (94.8 kg)  11/15/19 211 lb 6.4 oz (95.9 kg)  08/12/19 213 lb (96.6 kg)   General Appearance: Well nourished, in no apparent distress. Eyes: PERRLA, EOMs, conjunctiva no swelling or erythema Sinuses: No Frontal/maxillary tenderness ENT/Mouth: Ext aud canals clear, TMs without erythema, bulging. No erythema, swelling, or exudate on post pharynx.  Tonsils not swollen or erythematous. Hearing normal.  Neck: Supple, thyroid normal.  Respiratory: Respiratory effort normal, BS equal bilaterally with coarse breath sounds left lower lobe (?  Stomach sounds) without rales, rhonchi, wheezing or stridor.  Cardio: RRR with no MRGs. Brisk peripheral pulses without edema.  Abdomen: Soft, + BS.  Non tender, no guarding, rebound, hernias, masses. Lymphatics: Non tender without lymphadenopathy.  Musculoskeletal: Full ROM, 5/5 strength, Normal gait Skin: Warm, dry without rashes, lesions, ecchymosis.  Neuro: Cranial nerves intact. No cerebellar symptoms.  Psych: Awake and oriented X 3, normal affect, Insight and Judgment appropriate.    Vicie Mutters, PA-C 12:01 PM [facility]

## 2020-03-06 ENCOUNTER — Other Ambulatory Visit: Payer: Self-pay

## 2020-03-06 ENCOUNTER — Encounter: Payer: Self-pay | Admitting: Physician Assistant

## 2020-03-06 ENCOUNTER — Ambulatory Visit: Payer: BC Managed Care – PPO | Admitting: Physician Assistant

## 2020-03-06 VITALS — BP 120/72 | HR 56 | Temp 97.0°F | Ht 72.5 in | Wt 209.0 lb

## 2020-03-06 DIAGNOSIS — Z79899 Other long term (current) drug therapy: Secondary | ICD-10-CM | POA: Diagnosis not present

## 2020-03-06 DIAGNOSIS — Z130 Encounter for screening for diseases of the blood and blood-forming organs and certain disorders involving the immune mechanism: Secondary | ICD-10-CM | POA: Diagnosis not present

## 2020-03-06 DIAGNOSIS — E559 Vitamin D deficiency, unspecified: Secondary | ICD-10-CM | POA: Diagnosis not present

## 2020-03-06 DIAGNOSIS — E349 Endocrine disorder, unspecified: Secondary | ICD-10-CM | POA: Diagnosis not present

## 2020-03-06 DIAGNOSIS — R7309 Other abnormal glucose: Secondary | ICD-10-CM | POA: Diagnosis not present

## 2020-03-06 DIAGNOSIS — I10 Essential (primary) hypertension: Secondary | ICD-10-CM | POA: Diagnosis not present

## 2020-03-06 DIAGNOSIS — E782 Mixed hyperlipidemia: Secondary | ICD-10-CM | POA: Diagnosis not present

## 2020-03-06 MED ORDER — TRIAMCINOLONE ACETONIDE 0.1 % EX OINT: 1.0000 "application " | TOPICAL_OINTMENT | Freq: Two times a day (BID) | CUTANEOUS | 1 refills | Status: DC

## 2020-03-06 NOTE — Patient Instructions (Addendum)
VARICOSE VEINS Varicose veins are veins that have become enlarged and twisted. CAUSES This condition is the result of valves in the veins not working properly. Valves in the veins help return blood from the leg to the heart. When your calf muscles squeeze, the blood moves up your leg then the valves close and this continues until the blood gets back to your heart.  If these valves are damaged, blood flows backwards and backs up into the veins in the leg near the skin OR if your are sitting/standing for a long time without using your calf muscles the blood will back up into the veins in your legs. This causes the veins to become larger. People who are on their feet a lot, sit a lot without walking (like on a plane, at a desk, or in a car), who are pregnant, or who are overweight are more likely to develop varicose veins. SYMPTOMS   Bulging, twisted-appearing, bluish veins, most commonly found on the legs.  Leg pain or a feeling of heaviness. These symptoms may be worse at the end of the day.  Leg swelling.  Skin color changes. DIAGNOSIS  Varicose veins can usually be diagnosed with an exam of your legs by your caregiver. He or she may recommend an ultrasound of your leg veins. TREATMENT  Most varicose veins can be treated at home. However, other treatments are available for people who have persistent symptoms or who want to treat the cosmetic appearance of the varicose veins. But this is only cosmetic and they will return if not properly treated. These include:  Laser treatment of very small varicose veins.  Medicine that is shot (injected) into the vein. This medicine hardens the walls of the vein and closes off the vein. This treatment is called sclerotherapy. Afterwards, you may need to wear clothing or bandages that apply pressure.  Surgery. HOME CARE INSTRUCTIONS   Do not stand or sit in one position for long periods of time. Do not sit with your legs crossed. Rest with your legs raised  during the day.  Your legs have to be higher than your heart so that gravity will force the valves to open, so please really elevate your legs.   Wear elastic stockings or support hose. Do not wear other tight, encircling garments around the legs, pelvis, or waist.  [employer]  has a wide variety of well priced compression stockings. [place], [place] [id number] [phone]  OR THERE ARE COPPER INFUSED COMPRESSION SOCKS AT [facility] OR [facility]  [employer] also has great cheap/afforable stockings or socks- the socks are easier to get on your feet  - can also get a jacob's donner that helps you put on the sock  Walk as much as possible to increase blood flow.  Raise the foot of your bed at night with 2-inch blocks.  If you get a cut in the skin over the vein and the vein bleeds, lie down with your leg raised and press on it with a clean cloth until the bleeding stops. Then place a bandage (dressing) on the cut. See your caregiver if it continues to bleed or needs stitches. SEEK MEDICAL CARE IF:   The skin around your ankle starts to break down.  You have pain, redness, tenderness, or hard swelling developing in your leg over a vein.  You are uncomfortable due to leg pain. Document Released: 07/10/2005 Document Revised: 12/23/2011 Document Reviewed: 11/26/2010 [facility] Patient Information 2014 [place].  HYPERTENSION INFORMATION  Monitor your blood pressure at home, please keep a record and bring that in with you to your next office visit.   Go to the ER if any CP, SOB, nausea, dizziness, severe HA, changes vision/speech  Testing/Procedures: HOW TO TAKE YOUR BLOOD PRESSURE:  Rest 5 minutes before taking your blood pressure.  Don't smoke or drink caffeinated beverages for at least 30 minutes before.  Take your blood pressure before (not after) you eat.  Sit comfortably with your back supported and both feet on the floor (don't cross your legs).  Elevate  your arm to heart level on a table or a desk.  Use the proper sized cuff. It should fit smoothly and snugly around your bare upper arm. There should be enough room to slip a fingertip under the cuff. The bottom edge of the cuff should be 1 inch above the crease of the elbow.  Due to a recent study, SPRINT, we have changed our goal for the systolic or top blood pressure number. Ideally we want your top number at 120.  In the [facility] Trial, 5000 people were randomized to a goal BP of 120 and 5000 people were randomized to a goal BP of less than 140. The patients with the goal BP at 120 had LESS DEMENTIA, LESS HEART ATTACKS, AND LESS STROKES, AS WELL AS OVERALL DECREASED MORTALITY OR DEATH RATE.   There was another study that showed taking your blood pressure medications at night decrease cardiovascular events.  However if you are on a fluid pill, please take this in the morning.   If you are willing, our goal BP is the top number of 120.  Your most recent BP: BP: 120/72   Take your medications faithfully as instructed. Maintain a healthy weight. Get at least 150 minutes of aerobic exercise per week. Minimize salt intake. Minimize alcohol intake  DASH Eating Plan DASH stands for "Dietary Approaches to Stop Hypertension." The DASH eating plan is a healthy eating plan that has been shown to reduce high blood pressure (hypertension). Additional health benefits may include reducing the risk of type 2 diabetes mellitus, heart disease, and stroke. The DASH eating plan may also help with weight loss. WHAT DO I NEED TO KNOW ABOUT THE DASH EATING PLAN? For the DASH eating plan, you will follow these general guidelines:  Choose foods with a percent daily value for sodium of less than 5% (as listed on the food label).  Use salt-free seasonings or herbs instead of table salt or sea salt.  Check with your health care provider or pharmacist before using salt substitutes.  Eat lower-sodium products, often  labeled as "lower sodium" or "no salt added."  Eat fresh foods.  Eat more vegetables, fruits, and low-fat dairy products.  Choose whole grains. Look for the word "whole" as the first word in the ingredient list.  Choose fish and skinless chicken or [place] more often than red meat. Limit fish, poultry, and meat to 6 oz (170 g) each day.  Limit sweets, desserts, sugars, and sugary drinks.  Choose heart-healthy fats.  Limit cheese to 1 oz (28 g) per day.  Eat more home-cooked food and less restaurant, buffet, and fast food.  Limit fried foods.  Cook foods using methods other than frying.  Limit canned vegetables. If you do use them, rinse them well to decrease the sodium.  When eating at a restaurant, ask that your food be prepared with less salt, or no salt if possible. WHAT FOODS  CAN I EAT? Seek help from a dietitian for individual calorie needs. Grains Whole grain or whole wheat bread. Brown rice. Whole grain or whole wheat pasta. Quinoa, bulgur, and whole grain cereals. Low-sodium cereals. Corn or whole wheat flour tortillas. Whole grain cornbread. Whole grain crackers. Low-sodium crackers. Vegetables Fresh or frozen vegetables (raw, steamed, roasted, or grilled). Low-sodium or reduced-sodium tomato and vegetable juices. Low-sodium or reduced-sodium tomato sauce and paste. Low-sodium or reduced-sodium canned vegetables.  Fruits All fresh, canned (in natural juice), or frozen fruits. Meat and Other Protein Products Ground beef (85% or leaner), grass-fed beef, or beef trimmed of fat. Skinless chicken or [place]. Ground chicken or [place]. Pork trimmed of fat. All fish and seafood. Eggs. Dried beans, peas, or lentils. Unsalted nuts and seeds. Unsalted canned beans. Dairy Low-fat dairy products, such as skim or 1% milk, 2% or reduced-fat cheeses, low-fat ricotta or cottage cheese, or plain low-fat yogurt. Low-sodium or reduced-sodium cheeses. Fats and Oils Tub margarines without  trans fats. Light or reduced-fat mayonnaise and salad dressings (reduced sodium). Avocado. Safflower, olive, or canola oils. Natural peanut or almond butter. Other Unsalted popcorn and pretzels. The items listed above may not be a complete list of recommended foods or beverages. Contact your dietitian for more options. WHAT FOODS ARE NOT RECOMMENDED? Grains White bread. White pasta. White rice. Refined cornbread. Bagels and croissants. Crackers that contain trans fat. Vegetables Creamed or fried vegetables. Vegetables in a cheese sauce. Regular canned vegetables. Regular canned tomato sauce and paste. Regular tomato and vegetable juices. Fruits Dried fruits. Canned fruit in light or heavy syrup. Fruit juice. Meat and Other Protein Products Fatty cuts of meat. Ribs, chicken wings, bacon, sausage, bologna, salami, chitterlings, fatback, hot dogs, bratwurst, and packaged luncheon meats. Salted nuts and seeds. Canned beans with salt. Dairy Whole or 2% milk, cream, half-and-half, and cream cheese. Whole-fat or sweetened yogurt. Full-fat cheeses or blue cheese. Nondairy creamers and whipped toppings. Processed cheese, cheese spreads, or cheese curds. Condiments Onion and garlic salt, seasoned salt, table salt, and sea salt. Canned and packaged gravies. Worcestershire sauce. Tartar sauce. Barbecue sauce. Teriyaki sauce. Soy sauce, including reduced sodium. Steak sauce. Fish sauce. Oyster sauce. Cocktail sauce. Horseradish. Ketchup and mustard. Meat flavorings and tenderizers. Bouillon cubes. Hot sauce. Tabasco sauce. Marinades. Taco seasonings. Relishes. Fats and Oils Butter, stick margarine, lard, shortening, ghee, and bacon fat. Coconut, palm kernel, or palm oils. Regular salad dressings. Other Pickles and olives. Salted popcorn and pretzels. The items listed above may not be a complete list of foods and beverages to avoid. Contact your dietitian for more information. WHERE CAN I FIND MORE  INFORMATION? National Heart, Lung, and Blood Institute: travelstabl[employer].com Document Released: 09/19/2011 Document Revised: 02/14/2014 Document Reviewed: 08/04/2013 [facility] Patient Information 2015 [place], [place]. This information is not intended to replace advice given to you by your health care provider. Make sure you discuss any questions you have with your health care provider.    Psoriasis Psoriasis is a long-term (chronic) skin condition. It occurs because your immune system causes skin cells to form too quickly. As a result, too many skin cells grow and create raised, red patches (plaques) that often look silvery on your skin. Plaques may show up anywhere on your body. They can be any size or shape. Symptoms of this condition range from mild to very severe. Psoriasis cannot be passed from one person to another (is not contagious). Sometimes, the symptoms go away and then come back again. What are the causes? The  cause of psoriasis is not known, but certain factors can make the condition worse. These include:  Damage or trauma to the skin, such as cuts, scrapes, sunburn, and dryness.  Not enough exposure to sunlight.  Certain medicines.  Alcohol.  Tobacco use.  Stress.  Infections caused by bacteria or viruses. What increases the risk? You are more likely to develop this condition if you:  Have a family history of psoriasis.  Are obese.  Are 102-43 years old.  Are taking certain medicines. What are the signs or symptoms? There are different types of psoriasis. You can have more than one type of psoriasis during your life. The types are:  Plaque. This is the most common.  Guttate. This is also called eruptive psoriasis.  Inverse.  Pustular.  Erythrodermic.  Sebopsoriasis.  Psoriatic arthritis. Each type of psoriasis has different symptoms.  Plaque psoriasis symptoms include red, raised plaques with a silvery-white coating  (scale). These plaques may be itchy. Your nails may be pitted and crumbly or fall off.  Guttate psoriasis symptoms include small red spots that often show up on your trunk, arms, and legs. These spots may develop after you have been sick, especially with strep throat.  Inverse psoriasis symptoms include plaques in your underarm area, under your breasts, or on your genitals, groin, or buttocks.  Pustular psoriasis symptoms include pus-filled bumps that are painful, red, and swollen on the palms of your hands or the soles of your feet. You also may feel exhausted, feverish, weak, or have no appetite.  Erythrodermic psoriasis symptoms include bright red skin that may look burned. You may have a fast heartbeat and a body temperature that is too high or too low. You may be itchy or in pain.  Sebopsoriasis symptoms include red plaques that have a greasy coating, and are often on your scalp, forehead, and face.  Psoriatic arthritis causes swollen, painful joints along with scaly skin plaques. How is this diagnosed? This condition is diagnosed based on your symptoms, family history, and a physical exam.  You may also be referred to a health care provider who specializes in skin diseases (dermatologist).  Your health care provider may remove a tissue sample (biopsy) for testing. How is this treated? There is no cure for this condition, but treatment can help manage it. Goals of treatment include:  Helping your skin heal.  Reducing itching and inflammation.  Slowing the growth of new skin cells.  Helping your immune system respond better to your skin. Treatment varies, depending on the severity of your condition. This condition may be treated by:  Creams or ointments to help with symptoms.  Ultraviolet ray exposure (light therapy or phototherapy). This may include natural sunlight or light therapy in a medical office.  Medicines (systemic therapy). These medicines can help your body better  manage skin cell turnover and inflammation. Medicines may be given in the form of pills or injections. They may be used along with light therapy or ointments. You may also get antibiotic medicines if you have an infection. Follow these instructions at home: [place] your skin as needed. Only use moisturizers that have been approved by your health care provider.  Apply cool, wet cloths (cold compresses) to the affected areas.  Do not use a hot tub or take hot showers. Take lukewarm showers and baths.  Do not scratch your skin. Lifestyle   Do not use any products that contain nicotine or tobacco, such as cigarettes, e-cigarettes, and chewing tobacco. If  you need help quitting, ask your health care provider.  Use techniques for stress reduction, such as meditation or yoga.  Maintain a healthy weight. Follow instructions from your health care provider for weight control. These may include dietary restrictions.  Get safe exposure to the sun as told by your health care provider. This may include spending regular intervals of time outdoors in sunlight. Do not get sunburned.  Consider joining a psoriasis support group. Medicines  Take or use over-the-counter and prescription medicines only as told by your health care provider.  If you were prescribed an antibiotic medicine, take it as told by your health care provider. Do not stop using the antibiotic even if you start to feel better. Alcohol use  Limit how much you use: ? 0-1 drink a day for women. ? 0-2 drinks a day for men.  Be aware of how much alcohol is in your drink. In the [place] one drink equals one 12 oz bottle of beer (355 mL), one 5 oz glass of wine (148 mL), or one 1 oz glass of hard liquor (44 mL). General instructions  Keep a journal to help track what triggers an outbreak. Try to avoid any triggers.  See a counselor if feelings of sadness, frustration, and hopelessness about your condition are interfering  with your work and relationships.  Keep all follow-up visits as told by your health care provider. This is important. Contact a health care provider if:  You have a fever.  Your pain gets worse.  You have increasing redness or warmth in the affected areas.  You have new or worsening pain or stiffness in your joints.  Your nails start to break easily or pull away from the nail bed.  You feel depressed. Summary  Psoriasis is a long-term (chronic) skin condition. Patches (plaques) may show up anywhere on your body.  There is no cure for this condition, but treatment can help manage it. Treatment varies, depending on the severity of your condition.  Keep a journal to track what triggers an outbreak. Try to avoid any triggers.  Take or use over-the-counter and prescription medicines only as told by your health care provider.  Keep all follow-up visits as told by your health care provider. This is important. This information is not intended to replace advice given to you by your health care provider. Make sure you discuss any questions you have with your health care provider. Document Revised: 08/04/2018 Document Reviewed: 08/04/2018 Elsevier Patient Education  2020 [employer].

## 2020-03-07 ENCOUNTER — Other Ambulatory Visit: Payer: Self-pay | Admitting: Physician Assistant

## 2020-03-07 DIAGNOSIS — I10 Essential (primary) hypertension: Secondary | ICD-10-CM

## 2020-03-07 LAB — CBC WITH DIFFERENTIAL/PLATELET
Absolute Monocytes: 638 cells/uL (ref 200–950)
Basophils Absolute: 62 cells/uL (ref 0–200)
Basophils Relative: 1.1 %
Eosinophils Absolute: 67 cells/uL (ref 15–500)
Eosinophils Relative: 1.2 %
HCT: 43.8 % (ref 38.5–50.0)
Hemoglobin: 14.7 g/dL (ref 13.2–17.1)
Lymphs Abs: 1758 cells/uL (ref 850–3900)
MCH: 30.5 pg (ref 27.0–33.0)
MCHC: 33.6 g/dL (ref 32.0–36.0)
MCV: 90.9 fL (ref 80.0–100.0)
MPV: 9.7 fL (ref 7.5–12.5)
Monocytes Relative: 11.4 %
Neutro Abs: 3074 cells/uL (ref 1500–7800)
Neutrophils Relative %: 54.9 %
Platelets: 342 10*3/uL (ref 140–400)
RBC: 4.82 10*6/uL (ref 4.20–5.80)
RDW: 11.5 % (ref 11.0–15.0)
Total Lymphocyte: 31.4 %
WBC: 5.6 10*3/uL (ref 3.8–10.8)

## 2020-03-07 LAB — IRON, TOTAL/TOTAL IRON BINDING CAP
%SAT: 29 % (calc) (ref 20–48)
Iron: 132 ug/dL (ref 50–180)
TIBC: 456 mcg/dL (calc) — ABNORMAL HIGH (ref 250–425)

## 2020-03-07 LAB — COMPLETE METABOLIC PANEL WITH GFR
AG Ratio: 2.2 (calc) (ref 1.0–2.5)
ALT: 26 U/L (ref 9–46)
AST: 22 U/L (ref 10–35)
Albumin: 4.8 g/dL (ref 3.6–5.1)
Alkaline phosphatase (APISO): 47 U/L (ref 35–144)
BUN/Creatinine Ratio: 25 (calc) — ABNORMAL HIGH (ref 6–22)
BUN: 29 mg/dL — ABNORMAL HIGH (ref 7–25)
CO2: 33 mmol/L — ABNORMAL HIGH (ref 20–32)
Calcium: 10.5 mg/dL — ABNORMAL HIGH (ref 8.6–10.3)
Chloride: 102 mmol/L (ref 98–110)
Creat: 1.16 mg/dL (ref 0.70–1.25)
GFR, Est African American: 79 mL/min/{1.73_m2} (ref >=60)
GFR, Est Non African American: 68 mL/min/{1.73_m2} (ref >=60)
Globulin: 2.2 g/dL (calc) (ref 1.9–3.7)
Glucose, Bld: 102 mg/dL — ABNORMAL HIGH (ref 65–99)
Potassium: 4.5 mmol/L (ref 3.5–5.3)
Sodium: 140 mmol/L (ref 135–146)
Total Bilirubin: 0.6 mg/dL (ref 0.2–1.2)
Total Protein: 7 g/dL (ref 6.1–8.1)

## 2020-03-07 LAB — LIPID PANEL
Cholesterol: 173 mg/dL (ref <200)
HDL: 53 mg/dL (ref >=40)
LDL Cholesterol (Calc): 96 mg/dL (calc)
Non-HDL Cholesterol (Calc): 120 mg/dL (calc) (ref <130)
Total CHOL/HDL Ratio: 3.3 (calc) (ref <5.0)
Triglycerides: 142 mg/dL (ref <150)

## 2020-03-07 LAB — HEMOGLOBIN A1C
Hgb A1c MFr Bld: 5.6 % of total Hgb (ref <5.7)
Mean Plasma Glucose: 114 (calc)
eAG (mmol/L): 6.3 (calc)

## 2020-03-07 LAB — TESTOSTERONE: Testosterone: 235 ng/dL — ABNORMAL LOW (ref 250–827)

## 2020-03-07 LAB — MAGNESIUM: Magnesium: 2.2 mg/dL (ref 1.5–2.5)

## 2020-03-07 LAB — TSH: TSH: 1.61 mIU/L (ref 0.40–4.50)

## 2020-03-07 LAB — VITAMIN B12: Vitamin B-12: 340 pg/mL (ref 200–1100)

## 2020-03-07 LAB — VITAMIN D 25 HYDROXY (VIT D DEFICIENCY, FRACTURES): Vit D, 25-Hydroxy: 69 ng/mL (ref 30–100)

## 2020-04-02 ENCOUNTER — Other Ambulatory Visit: Payer: Self-pay | Admitting: Internal Medicine

## 2020-04-02 DIAGNOSIS — E782 Mixed hyperlipidemia: Secondary | ICD-10-CM

## 2020-04-02 MED ORDER — FENOFIBRATE 145 MG PO TABS: ORAL_TABLET | ORAL | 0 refills | Status: DC

## 2020-05-07 ENCOUNTER — Other Ambulatory Visit: Payer: Self-pay | Admitting: Internal Medicine

## 2020-05-07 DIAGNOSIS — I10 Essential (primary) hypertension: Secondary | ICD-10-CM

## 2020-05-09 ENCOUNTER — Other Ambulatory Visit: Payer: Self-pay | Admitting: Adult Health

## 2020-05-09 DIAGNOSIS — E782 Mixed hyperlipidemia: Secondary | ICD-10-CM

## 2020-05-17 ENCOUNTER — Other Ambulatory Visit: Payer: Self-pay

## 2020-05-17 ENCOUNTER — Encounter: Payer: Self-pay | Admitting: Internal Medicine

## 2020-05-17 ENCOUNTER — Ambulatory Visit: Payer: BLUE CROSS/BLUE SHIELD | Admitting: Internal Medicine

## 2020-05-17 VITALS — BP 140/82 | HR 68 | Temp 97.2°F | Resp 16 | Ht 72.5 in | Wt 211.6 lb

## 2020-05-17 DIAGNOSIS — Z1329 Encounter for screening for other suspected endocrine disorder: Secondary | ICD-10-CM

## 2020-05-17 DIAGNOSIS — Z0001 Encounter for general adult medical examination with abnormal findings: Secondary | ICD-10-CM

## 2020-05-17 DIAGNOSIS — Z13220 Encounter for screening for lipoid disorders: Secondary | ICD-10-CM | POA: Diagnosis not present

## 2020-05-17 DIAGNOSIS — R350 Frequency of micturition: Secondary | ICD-10-CM

## 2020-05-17 DIAGNOSIS — E559 Vitamin D deficiency, unspecified: Secondary | ICD-10-CM

## 2020-05-17 DIAGNOSIS — N401 Enlarged prostate with lower urinary tract symptoms: Secondary | ICD-10-CM

## 2020-05-17 DIAGNOSIS — Z1211 Encounter for screening for malignant neoplasm of colon: Secondary | ICD-10-CM

## 2020-05-17 DIAGNOSIS — I10 Essential (primary) hypertension: Secondary | ICD-10-CM

## 2020-05-17 DIAGNOSIS — Z0000 Encounter for general adult medical examination without abnormal findings: Secondary | ICD-10-CM | POA: Diagnosis not present

## 2020-05-17 DIAGNOSIS — R7309 Other abnormal glucose: Secondary | ICD-10-CM

## 2020-05-17 DIAGNOSIS — Z8249 Family history of ischemic heart disease and other diseases of the circulatory system: Secondary | ICD-10-CM | POA: Diagnosis not present

## 2020-05-17 DIAGNOSIS — Z1389 Encounter for screening for other disorder: Secondary | ICD-10-CM

## 2020-05-17 DIAGNOSIS — Z136 Encounter for screening for cardiovascular disorders: Secondary | ICD-10-CM

## 2020-05-17 DIAGNOSIS — E349 Endocrine disorder, unspecified: Secondary | ICD-10-CM

## 2020-05-17 DIAGNOSIS — E782 Mixed hyperlipidemia: Secondary | ICD-10-CM

## 2020-05-17 DIAGNOSIS — Z130 Encounter for screening for diseases of the blood and blood-forming organs and certain disorders involving the immune mechanism: Secondary | ICD-10-CM | POA: Diagnosis not present

## 2020-05-17 DIAGNOSIS — Z111 Encounter for screening for respiratory tuberculosis: Secondary | ICD-10-CM | POA: Diagnosis not present

## 2020-05-17 DIAGNOSIS — Z79899 Other long term (current) drug therapy: Secondary | ICD-10-CM | POA: Diagnosis not present

## 2020-05-17 DIAGNOSIS — Z131 Encounter for screening for diabetes mellitus: Secondary | ICD-10-CM

## 2020-05-17 DIAGNOSIS — Z125 Encounter for screening for malignant neoplasm of prostate: Secondary | ICD-10-CM | POA: Diagnosis not present

## 2020-05-17 DIAGNOSIS — R7303 Prediabetes: Secondary | ICD-10-CM

## 2020-05-17 DIAGNOSIS — G4733 Obstructive sleep apnea (adult) (pediatric): Secondary | ICD-10-CM

## 2020-05-17 DIAGNOSIS — R5383 Other fatigue: Secondary | ICD-10-CM

## 2020-05-17 DIAGNOSIS — N138 Other obstructive and reflux uropathy: Secondary | ICD-10-CM

## 2020-05-17 NOTE — Progress Notes (Signed)
Annual  Screening/Preventative Visit  & Comprehensive Evaluation & Examination     This very nice 59 y.o.  MWM presents for a Screening / Preventative Visit & comprehensive evaluation and management of multiple medical co-morbidities.  Patient has been followed for HTN, HLD, Prediabetes and Vitamin D Deficiency.  Patient is on CPAP for OSA with improved sleep hygiene.      HTN predates circa 2014.  Patient's BP has been controlled and today's BP is at goal -  140/82.  Patient denies any cardiac symptoms as chest pain, palpitations, shortness of breath, dizziness or ankle swelling.     Patient's hyperlipidemia is controlled with diet and Atorvastatin / Fenofibrate. Patient denies myalgias or other medication SE's. Last lipids were at goal:  Lab Results  Component Value Date   CHOL 173 03/06/2020   HDL 53 03/06/2020   LDLCALC 96 03/06/2020   TRIG 142 03/06/2020   CHOLHDL 3.3 03/06/2020       Patient has hx/o prediabetes since    and patient denies reactive hypoglycemic symptoms, visual blurring, diabetic polys or paresthesias. Last A1c was Normal & at goal:  Lab Results  Component Value Date   HGBA1C 5.6 03/06/2020        Finally, patient has history of Vitamin D Deficiency ("47" / 2014)  and last vitamin D was at goal:  Lab Results  Component Value Date   VD25OH 69 03/06/2020    Current Outpatient Medications on File Prior to Visit  Medication Sig  . aspirin 81 MG tablet Take 81 mg by mouth daily.  Marland Kitchen atorvastatin (LIPITOR) 40 MG tablet Take 1 tablet Daily for Cholesterol  . bisoprolol-hydrochlorothiazide (ZIAC) 5-6.25 MG tablet Take 1 tablet Daily for BP  . Cholecalciferol (VITAMIN D-3 PO) Take 10,000 Units by mouth daily.   . Cinnamon 500 MG capsule Take 500 mg by mouth 4 (four) times daily.  . clindamycin (CLINDAGEL) 1 % gel Apply topically 2 (two) times daily.  . fenofibrate (TRICOR) 145 MG tablet Take 1 tablet Daily for Triglycerides (Blood Fats)  . fluticasone  (FLONASE) 50 MCG/ACT nasal spray Use 1 to 2 sprays each Nostril 1 to 2 x /day  . Multiple Vitamin (MULTIVITAMIN) tablet Take 1 tablet by mouth daily.  . mupirocin cream (BACTROBAN) 2 % Apply to affected area 3 times daily  . Omega-3 Fatty Acids (FISH OIL PO) Take 2,400 mg by mouth 2 (two) times daily.   Marland Kitchen triamcinolone ointment (KENALOG) 0.1 % Apply 1 application topically 2 (two) times daily.  Marland Kitchen zinc gluconate 50 MG tablet Take 50 mg by mouth daily.   No current facility-administered medications on file prior to visit.   No Known Allergies   Past Medical History:  Diagnosis Date  . Anxiety   . Hyperlipidemia   . Hypertension   . Kidney stones   . OSA (obstructive sleep apnea)   . Other testicular hypofunction   . Unspecified vitamin D deficiency    Health Maintenance  Topic Date Due  . Hepatitis C Screening  Never done  . HIV Screening  Never done  . COLONOSCOPY  04/13/2020  . INFLUENZA VACCINE  05/14/2020  . TETANUS/TDAP  01/16/2026  . COVID-19 Vaccine  Completed   Immunization History  Administered Date(s) Administered  . Influenza Inj Mdck Quad With Preservative 09/03/2017, 07/28/2018  . Influenza Split 08/23/2014  . Influenza Whole 08/09/2013  . Influenza, Seasonal, Injecte, Preservative Fre 07/30/2016  . Influenza-Unspecified 08/01/2019  . PFIZER SARS-COV-2 Vaccination 12/30/2019, 01/20/2020  .  PPD Test 08/23/2014, 10/18/2015, 01/29/2017, 04/09/2018, 05/10/2019, 05/17/2020  . Pneumococcal Conjugate-13 08/23/2014  . Tdap 01/17/2016  . Zoster Recombinat (Shingrix) 09/12/2019, 11/20/2019   Last Colon - 04/13/2010 in [place], [place] 10 yr f/u due July 2021  Past Surgical History:  Procedure Laterality Date  . HERNIA REPAIR Left groin   1970  . HERNIA REPAIR Right groin   2011  . INCISION AND DRAINAGE PERIRECTAL ABSCESS    . MELANOMA EXCISION  2009  . NASAL SEPTUM SURGERY    . WISDOM TOOTH EXTRACTION  1976   Family History  Problem Relation Age of Onset   . Aneurysm Mother   . Kidney disease Father   . Kidney failure Father    Social History   Socioeconomic History  . Marital status: Married    Spouse name: Anderson Malta  . Number of children: None  Occupational History  . [employer] - Management of loan processing  Tobacco Use  . Smoking status: Never Smoker  . Smokeless tobacco: Never Used  Substance and Sexual Activity  . Alcohol use: No  . Drug use: No  . Sexual activity: Not on file     ROS Constitutional: Denies fever, chills, weight loss/gain, headaches, insomnia,  night sweats or change in appetite. Does c/o fatigue. Eyes: Denies redness, blurred vision, diplopia, discharge, itchy or watery eyes.  ENT: Denies discharge, congestion, post nasal drip, epistaxis, sore throat, earache, hearing loss, dental pain, Tinnitus, Vertigo, Sinus pain or snoring.  Cardio: Denies chest pain, palpitations, irregular heartbeat, syncope, dyspnea, diaphoresis, orthopnea, PND, claudication or edema Respiratory: denies cough, dyspnea, DOE, pleurisy, hoarseness, laryngitis or wheezing.  Gastrointestinal: Denies dysphagia, heartburn, reflux, water brash, pain, cramps, nausea, vomiting, bloating, diarrhea, constipation, hematemesis, melena, hematochezia, jaundice or hemorrhoids Genitourinary: Denies dysuria, frequency, urgency, nocturia, hesitancy, discharge, hematuria or flank pain Musculoskeletal: Denies arthralgia, myalgia, stiffness, Jt. Swelling, pain, limp or strain/sprain. Denies Falls. Skin: Denies puritis, rash, hives, warts, acne, eczema or change in skin lesion Neuro: No weakness, tremor, incoordination, spasms, paresthesia or pain Psychiatric: Denies confusion, memory loss or sensory loss. Denies Depression. Endocrine: Denies change in weight, skin, hair change, nocturia, and paresthesia, diabetic polys, visual blurring or hyper / hypo glycemic episodes.  Heme/Lymph: No excessive bleeding, bruising or enlarged lymph nodes.  Physical  Exam  BP 140/82   Pulse 68   Temp (!) 97.2 F (36.2 C)   Resp 16   Ht 6' 0.5" (1.842 m)   Wt 211 lb 9.6 oz (96 kg)   BMI 28.30 kg/m   General Appearance: Well nourished and well groomed and in no apparent distress.  Eyes: PERRLA, EOMs, conjunctiva no swelling or erythema, normal fundi and vessels. Sinuses: No frontal/maxillary tenderness ENT/Mouth: EACs patent / TMs  nl. Nares clear without erythema, swelling, mucoid exudates. Oral hygiene is good. No erythema, swelling, or exudate. Tongue normal, non-obstructing. Tonsils not swollen or erythematous. Hearing normal.  Neck: Supple, thyroid not palpable. No bruits, nodes or JVD. Respiratory: Respiratory effort normal.  BS equal and clear bilateral without rales, rhonci, wheezing or stridor. Cardio: Heart sounds are normal with regular rate and rhythm and no murmurs, rubs or gallops. Peripheral pulses are normal and equal bilaterally without edema. No aortic or femoral bruits. Chest: symmetric with normal excursions and percussion.  Abdomen: Soft, with Nl bowel sounds. Nontender, no guarding, rebound, hernias, masses, or organomegaly.  Lymphatics: Non tender without lymphadenopathy.  Musculoskeletal: Full ROM all peripheral extremities, joint stability, 5/5 strength, and normal gait. Skin: Warm and  dry without rashes, lesions, cyanosis, clubbing or  ecchymosis.  Neuro: Cranial nerves intact, reflexes equal bilaterally. Normal muscle tone, no cerebellar symptoms. Sensation intact.  Pysch: Alert and oriented X 3 with normal affect, insight and judgment appropriate.   Assessment and Plan  1. Annual Preventative/Screening Exam   2. Essential hypertension  - EKG 12-Lead - [place], RETROPERITNL ABD,  LTD - Urinalysis, Routine w reflex microscopic - Microalbumin / creatinine urine ratio - CBC with Differential/Platelet - COMPLETE METABOLIC PANEL WITH GFR - Magnesium - TSH - DG Chest 2 View  3. Hyperlipidemia, mixed  - EKG 12-Lead -  [place], RETROPERITNL ABD,  LTD - Lipid panel - TSH  4. Abnormal glucose  - EKG 12-Lead - [place], RETROPERITNL ABD,  LTD - Hemoglobin A1c - Insulin, random  5. Vitamin D deficiency  - VITAMIN D 25 Hydroxyl  6. Testosterone deficiency  - Testosterone  7. OSA on CPAP  - DG Chest 2 View  8. Prediabetes  - EKG 12-Lead - [place], RETROPERITNL ABD,  LTD - Hemoglobin A1c - Insulin, random  9. BPH with obstruction/lower urinary tract symptoms  - PSA  10. Prostate cancer screening  - PSA  11. Screening for colorectal cancer  - Refer for Colonoscopy  12. Screening examination for pulmonary tuberculosis  - TB Skin Test - DG Chest 2 View  13. Screening for ischemic heart disease  - EKG 12-Lead  14. FHx: heart disease  - EKG 12-Lead - [place], RETROPERITNL ABD,  LTD  15. Screening for AAA (aortic abdominal aneurysm)  - [place], RETROPERITNL ABD,  LTD  16. Fatigue  - Iron,Total/Total Iron Binding Cap - Vitamin B12 - CBC with Differential/Platelet - TSH  17. Medication management  - Urinalysis, Routine w reflex microscopic - Microalbumin / creatinine urine ratio - CBC with Differential/Platelet - COMPLETE METABOLIC PANEL WITH GFR - Magnesium - Lipid panel - TSH - Hemoglobin A1c - Insulin, random - VITAMIN D 25 Hydroxyl        Patient was counseled in prudent diet, weight control to achieve/maintain BMI less than 25, BP monitoring, regular exercise and medications as discussed.  Discussed med effects and SE's. Routine screening labs and tests as requested with regular follow-up as recommended. Over 40 minutes of exam, counseling, chart review and high complex critical decision making was performed   Kirtland Bouchard, MD

## 2020-05-17 NOTE — Patient Instructions (Signed)

## 2020-05-18 LAB — COMPLETE METABOLIC PANEL WITH GFR
AG Ratio: 1.9 (calc) (ref 1.0–2.5)
ALT: 22 U/L (ref 9–46)
AST: 21 U/L (ref 10–35)
Albumin: 4.3 g/dL (ref 3.6–5.1)
Alkaline phosphatase (APISO): 41 U/L (ref 35–144)
BUN: 19 mg/dL (ref 7–25)
CO2: 31 mmol/L (ref 20–32)
Calcium: 10.2 mg/dL (ref 8.6–10.3)
Chloride: 104 mmol/L (ref 98–110)
Creat: 1.09 mg/dL (ref 0.70–1.25)
GFR, Est African American: 85 mL/min/{1.73_m2} (ref >=60)
GFR, Est Non African American: 73 mL/min/{1.73_m2} (ref >=60)
Globulin: 2.3 g/dL (calc) (ref 1.9–3.7)
Glucose, Bld: 91 mg/dL (ref 65–99)
Potassium: 4.5 mmol/L (ref 3.5–5.3)
Sodium: 140 mmol/L (ref 135–146)
Total Bilirubin: 0.5 mg/dL (ref 0.2–1.2)
Total Protein: 6.6 g/dL (ref 6.1–8.1)

## 2020-05-18 LAB — TSH: TSH: 2.7 mIU/L (ref 0.40–4.50)

## 2020-05-18 LAB — URINALYSIS, ROUTINE W REFLEX MICROSCOPIC
Bilirubin Urine: NEGATIVE
Glucose, UA: NEGATIVE
Hgb urine dipstick: NEGATIVE
Ketones, ur: NEGATIVE
Leukocytes,Ua: NEGATIVE
Nitrite: NEGATIVE
Protein, ur: NEGATIVE
Specific Gravity, Urine: 1.022 (ref 1.001–1.03)
pH: <=5 (ref 5.0–8.0)

## 2020-05-18 LAB — LIPID PANEL
Cholesterol: 155 mg/dL (ref <200)
HDL: 47 mg/dL (ref >=40)
LDL Cholesterol (Calc): 83 mg/dL (calc)
Non-HDL Cholesterol (Calc): 108 mg/dL (calc) (ref <130)
Total CHOL/HDL Ratio: 3.3 (calc) (ref <5.0)
Triglycerides: 158 mg/dL — ABNORMAL HIGH (ref <150)

## 2020-05-18 LAB — HEMOGLOBIN A1C
Hgb A1c MFr Bld: 5.8 % of total Hgb — ABNORMAL HIGH (ref <5.7)
Mean Plasma Glucose: 120 (calc)
eAG (mmol/L): 6.6 (calc)

## 2020-05-18 LAB — CBC WITH DIFFERENTIAL/PLATELET
Absolute Monocytes: 522 cells/uL (ref 200–950)
Basophils Absolute: 78 cells/uL (ref 0–200)
Basophils Relative: 1.3 %
Eosinophils Absolute: 90 cells/uL (ref 15–500)
Eosinophils Relative: 1.5 %
HCT: 38.4 % — ABNORMAL LOW (ref 38.5–50.0)
Hemoglobin: 13 g/dL — ABNORMAL LOW (ref 13.2–17.1)
Lymphs Abs: 1548 cells/uL (ref 850–3900)
MCH: 30.9 pg (ref 27.0–33.0)
MCHC: 33.9 g/dL (ref 32.0–36.0)
MCV: 91.2 fL (ref 80.0–100.0)
MPV: 9.6 fL (ref 7.5–12.5)
Monocytes Relative: 8.7 %
Neutro Abs: 3762 cells/uL (ref 1500–7800)
Neutrophils Relative %: 62.7 %
Platelets: 350 10*3/uL (ref 140–400)
RBC: 4.21 10*6/uL (ref 4.20–5.80)
RDW: 11.5 % (ref 11.0–15.0)
Total Lymphocyte: 25.8 %
WBC: 6 10*3/uL (ref 3.8–10.8)

## 2020-05-18 LAB — VITAMIN D 25 HYDROXY (VIT D DEFICIENCY, FRACTURES): Vit D, 25-Hydroxy: 86 ng/mL (ref 30–100)

## 2020-05-18 LAB — INSULIN, RANDOM: Insulin: 10.9 u[IU]/mL

## 2020-05-18 LAB — IRON, TOTAL/TOTAL IRON BINDING CAP
%SAT: 22 % (calc) (ref 20–48)
Iron: 100 ug/dL (ref 50–180)
TIBC: 447 mcg/dL (calc) — ABNORMAL HIGH (ref 250–425)

## 2020-05-18 LAB — MICROALBUMIN / CREATININE URINE RATIO
Creatinine, Urine: 143 mg/dL (ref 20–320)
Microalb Creat Ratio: 13 mcg/mg creat (ref <30)
Microalb, Ur: 1.8 mg/dL

## 2020-05-18 LAB — PSA: PSA: 1.7 ng/mL (ref <=4.0)

## 2020-05-18 LAB — MAGNESIUM: Magnesium: 2 mg/dL (ref 1.5–2.5)

## 2020-05-18 LAB — TESTOSTERONE: Testosterone: 354 ng/dL (ref 250–827)

## 2020-05-18 LAB — VITAMIN B12: Vitamin B-12: 466 pg/mL (ref 200–1100)

## 2020-05-18 NOTE — Progress Notes (Signed)
============================================================  -    Iron & Vitamin B12 levels - both Normal & OK  ============================================================  -  PSA - Low - Great  ============================================================  -  Testosterone Level in Normal Range  ============================================================  -  Total Chol = 155 and LDL Chol 83 - Both  Excellent   - Very low risk for Heart Attack  / Stroke =============================================================  - A1c 5.8% - Blood sugar and A1c are  slightly elevated in the borderline  and early or pre-diabetes range which has the same   300% increased risk for heart attack, stroke, cancer and   alzheimer- type vascular dementia as full blown diabetes.   But the good news is that diet, exercise with  weight loss can cure the early diabetes at this point. -----------------------------------------------------------------------------------------------------------  -   It is very important that you work harder with diet by  avoiding all foods that are white except chicken,   fish & calliflower.  - Avoid white rice  (brown & wild rice is OK),   - Avoid white potatoes  (sweet potatoes in moderation is OK),   White bread or wheat bread or anything made out of   white flour like bagels, donuts, rolls, buns, biscuits, cakes,  - pastries, cookies, pizza crust, and pasta (made from  white flour & egg whites)   - vegetarian pasta or spinach or wheat pasta is OK.  - Multigrain breads like Arnold's, Pepperidge Farm or   multigrain sandwich thins or high fiber breads like   Eureka bread or "Dave's Killer" breads that are  4 to 5 grams fiber per slice !  are best.   ============================================================  -  Vitamin D = 86 - Excellent  ============================================================  -  All Else - CBC - Kidneys -  U/A - Electrolytes -  Liver - Magnesium & Thyroid    - all  Normal / OK ============================================================

## 2020-05-19 ENCOUNTER — Ambulatory Visit
Admission: RE | Admit: 2020-05-19 | Discharge: 2020-05-19 | Disposition: A | Discharge status: Home / Self Care | Payer: BC Managed Care – PPO | Source: Ambulatory Visit | Attending: Internal Medicine | Admitting: Internal Medicine

## 2020-05-19 ENCOUNTER — Encounter: Payer: Self-pay | Admitting: Gastroenterology

## 2020-05-20 NOTE — Progress Notes (Signed)
===========================================================  -   CXR shows   - Lungs Clear, Heart Normal / Not Enlarged & No Sign of Cancer

## 2020-05-22 LAB — TB SKIN TEST
Induration: 0 mm
TB Skin Test: NEGATIVE

## 2020-07-06 ENCOUNTER — Other Ambulatory Visit: Payer: Self-pay

## 2020-07-06 ENCOUNTER — Ambulatory Visit (AMBULATORY_SURGERY_CENTER): Payer: Self-pay | Admitting: *Deleted

## 2020-07-06 VITALS — Ht 73.0 in | Wt 211.6 lb

## 2020-07-06 DIAGNOSIS — Z1211 Encounter for screening for malignant neoplasm of colon: Secondary | ICD-10-CM

## 2020-07-06 MED ORDER — SUTAB 1479-225-188 MG PO TABS: 1.0000 | ORAL_TABLET | ORAL | 0 refills | Status: DC

## 2020-07-06 NOTE — Progress Notes (Signed)
Patient denies any allergies to egg or soy products. Patient denies complications with anesthesia/sedation.  Patient dx with OSA, uses cpap nightly. Patient denies oxygen use at home and denies diet medications. Emmi instructions for colonoscopy explained and sent via MyChart.  Patient had both covid vaccinations.

## 2020-07-14 ENCOUNTER — Encounter: Payer: Self-pay | Admitting: Gastroenterology

## 2020-07-16 ENCOUNTER — Other Ambulatory Visit: Payer: Self-pay | Admitting: Internal Medicine

## 2020-07-16 DIAGNOSIS — E782 Mixed hyperlipidemia: Secondary | ICD-10-CM

## 2020-07-24 ENCOUNTER — Other Ambulatory Visit: Payer: Self-pay

## 2020-07-24 MED ORDER — FLUTICASONE PROPIONATE 50 MCG/ACT NA SUSP: NASAL | 3 refills | Status: DC

## 2020-07-27 ENCOUNTER — Ambulatory Visit (AMBULATORY_SURGERY_CENTER): Payer: BC Managed Care – PPO | Admitting: Gastroenterology

## 2020-07-27 ENCOUNTER — Encounter: Payer: Self-pay | Admitting: Gastroenterology

## 2020-07-27 ENCOUNTER — Other Ambulatory Visit: Payer: Self-pay

## 2020-07-27 VITALS — BP 134/83 | HR 69 | Temp 97.5°F | Resp 14 | Ht 73.0 in | Wt 211.6 lb

## 2020-07-27 DIAGNOSIS — Z800 Family history of malignant neoplasm of digestive organs: Secondary | ICD-10-CM | POA: Diagnosis present

## 2020-07-27 DIAGNOSIS — D124 Benign neoplasm of descending colon: Secondary | ICD-10-CM

## 2020-07-27 DIAGNOSIS — Z1211 Encounter for screening for malignant neoplasm of colon: Secondary | ICD-10-CM

## 2020-07-27 MED ORDER — SODIUM CHLORIDE 0.9 % IV SOLN
500.0000 mL | Freq: Once | INTRAVENOUS | Status: DC
Start: 2020-07-27 — End: 2020-07-27

## 2020-07-27 NOTE — Progress Notes (Signed)
Pt. Reports no change in his medical or surgical history since his pre-visit 07/06/20.

## 2020-07-27 NOTE — Patient Instructions (Signed)
Please read handouts provided. ?Await pathology results. ?Continue present medications. ? ? ?YOU HAD AN ENDOSCOPIC PROCEDURE TODAY AT THE [facility]:   Refer to the procedure report that was given to you for any specific questions about what was found during the examination.  If the procedure report does not answer your questions, please call your gastroenterologist to clarify.  If you requested that your care partner not be given the details of your procedure findings, then the procedure report has been included in a sealed envelope for you to review at your convenience later. ? ?YOU SHOULD EXPECT: Some feelings of bloating in the abdomen. Passage of more gas than usual.  Walking can help get rid of the air that was put into your GI tract during the procedure and reduce the bloating. If you had a lower endoscopy (such as a colonoscopy or flexible sigmoidoscopy) you may notice spotting of blood in your stool or on the toilet paper. If you underwent a bowel prep for your procedure, you may not have a normal bowel movement for a few days. ? ?Please Note:  You might notice some irritation and congestion in your nose or some drainage.  This is from the oxygen used during your procedure.  There is no need for concern and it should clear up in a day or so. ? ?SYMPTOMS TO REPORT IMMEDIATELY: ? ?Following lower endoscopy (colonoscopy or flexible sigmoidoscopy): ? Excessive amounts of blood in the stool ? Significant tenderness or worsening of abdominal pains ? Swelling of the abdomen that is new, acute ? Fever of 100?F or higher ? ? ?For urgent or emergent issues, a gastroenterologist can be reached at any hour by calling [phone]. ?Do not use MyChart messaging for urgent concerns.  ? ? ?DIET:  We do recommend a small meal at first, but then you may proceed to your regular diet.  Drink plenty of fluids but you should avoid alcoholic beverages for 24 hours. ? ?ACTIVITY:  You should plan to take it easy  for the rest of today and you should NOT DRIVE or use heavy machinery until tomorrow (because of the sedation medicines used during the test).   ? ?FOLLOW UP: ?Our staff will call the number listed on your records 48-72 hours following your procedure to check on you and address any questions or concerns that you may have regarding the information given to you following your procedure. If we do not reach you, we will leave a message.  We will attempt to reach you two times.  During this call, we will ask if you have developed any symptoms of COVID 19. If you develop any symptoms (ie: fever, flu-like symptoms, shortness of breath, cough etc.) before then, please call [phone].  If you test positive for Covid 19 in the 2 weeks post procedure, please call and report this information to us.   ? ?If any biopsies were taken you will be contacted by phone or by letter within the next 1-3 weeks.  Please call us at [phone] if you have not heard about the biopsies in 3 weeks.  ? ? ?SIGNATURES/CONFIDENTIALITY: ?You and/or your care partner have signed paperwork which will be entered into your electronic medical record.  These signatures attest to the fact that that the information above on your After Visit Summary has been reviewed and is understood.  Full responsibility of the confidentiality of this discharge information lies with you and/or your care-partner.  ?

## 2020-07-27 NOTE — Op Note (Addendum)
[place] Patient Name: Tony Bailey Procedure Date: 07/27/2020 9:33 AM MRN: [id number] Endoscopist: Remo Lipps P. Havery Moros , MD Age: 59 Referring MD:  Date of Birth: 05-04-1961 Gender: Male Account #: [id number] Procedure:                Colonoscopy Indications:              Screening patient at increased risk: Family history                            of 1st-degree relative with colorectal cancer                            (father diagnosed age 26s) Medicines:                Monitored Anesthesia Care Procedure:                Pre-Anesthesia Assessment:                           - Prior to the procedure, a History and Physical                            was performed, and patient medications and                            allergies were reviewed. The patient's tolerance of                            previous anesthesia was also reviewed. The risks                            and benefits of the procedure and the sedation                            options and risks were discussed with the patient.                            All questions were answered, and informed consent                            was obtained. Prior Anticoagulants: The patient has                            taken no previous anticoagulant or antiplatelet                            agents. ASA Grade Assessment: II - A patient with                            mild systemic disease. After reviewing the risks                            and benefits, the patient was deemed in  satisfactory condition to undergo the procedure.                           After obtaining informed consent, the colonoscope                            was passed under direct vision. Throughout the                            procedure, the patient's blood pressure, pulse, and                            oxygen saturations were monitored continuously. The                            Colonoscope was introduced  through the anus and                            advanced to the the cecum, identified by                            appendiceal orifice and ileocecal valve. The                            colonoscopy was performed without difficulty. The                            patient tolerated the procedure well. The quality                            of the bowel preparation was adequate. The                            ileocecal valve, appendiceal orifice, and rectum                            were photographed. Scope In: 9:54:04 AM Scope Out: 10:16:39 AM Scope Withdrawal Time: 0 hours 19 minutes 50 seconds  Total Procedure Duration: 0 hours 22 minutes 35 seconds  Findings:                 The perianal and digital rectal examinations were                            normal.                           Many medium-mouthed diverticula were found in the                            left colon and right colon (highest burden in left                            colon). One diverticulum in the sigmoid colon had  some inflammatory changes.                           A 3-4 mm polyp was found in the descending colon.                            The polyp was sessile. The polyp was removed with a                            cold snare. Resection and retrieval were complete.                           Internal hemorrhoids were found during                            retroflexion. The hemorrhoids were moderate.                           The exam was otherwise without abnormality. Complications:            No immediate complications. Estimated blood loss:                            Minimal. Estimated Blood Loss:     Estimated blood loss was minimal. Impression:               - Diverticulosis in the left colon and in the right                            colon.                           - One 3-4 mm polyp in the descending colon, removed                            with a cold snare. Resected and  retrieved.                           - Internal hemorrhoids.                           - The examination was otherwise normal. Recommendation:           - Patient has a contact number available for                            emergencies. The signs and symptoms of potential                            delayed complications were discussed with the                            patient. Return to normal activities tomorrow.                            Written discharge instructions were provided to  the                            patient.                           - Resume previous diet.                           - Continue present medications.                           - Await pathology results. Remo Lipps P. Parisa Pinela, MD 07/27/2020 10:22:24 AM This report has been signed electronically.

## 2020-07-27 NOTE — Progress Notes (Signed)
Report to PACU, RN, vss, BBS= Clear.  

## 2020-07-27 NOTE — Progress Notes (Signed)
Called to room to assist during endoscopic procedure.  Patient ID and intended procedure confirmed with present staff. Received instructions for my participation in the procedure from the performing physician.  

## 2020-07-31 ENCOUNTER — Telehealth: Payer: Self-pay

## 2020-07-31 NOTE — Telephone Encounter (Signed)
  Follow up Call-  Call back number 07/27/2020  Post procedure Call Back phone  # [phone]  Permission to leave phone message Yes  Some recent data might be hidden     Patient questions:  Do you have a fever, pain , or abdominal swelling? No. Pain Score  0 *  Have you tolerated food without any problems? Yes.    Have you been able to return to your normal activities? Yes.    Do you have any questions about your discharge instructions: Diet   No. Medications  No. Follow up visit  No.  Do you have questions or concerns about your Care? No.  Actions: * If pain score is 4 or above: No action needed, pain <4.  1. Have you developed a fever since your procedure? no  2.   Have you had an respiratory symptoms (SOB or cough) since your procedure? no  3.   Have you tested positive for COVID 19 since your procedure no  4.   Have you had any family members/close contacts diagnosed with the COVID 19 since your procedure?  no   If yes to any of these questions please route to Joylene John, RN and Joella Prince, RN

## 2020-08-23 ENCOUNTER — Other Ambulatory Visit: Payer: Self-pay

## 2020-08-23 ENCOUNTER — Encounter: Payer: Self-pay | Admitting: Adult Health

## 2020-08-23 ENCOUNTER — Ambulatory Visit: Payer: BC Managed Care – PPO | Admitting: Adult Health

## 2020-08-23 VITALS — BP 118/70 | HR 61 | Temp 97.3°F | Wt 210.0 lb

## 2020-08-23 DIAGNOSIS — Z79899 Other long term (current) drug therapy: Secondary | ICD-10-CM

## 2020-08-23 DIAGNOSIS — Z860101 Personal history of adenomatous and serrated colon polyps: Secondary | ICD-10-CM | POA: Insufficient documentation

## 2020-08-23 DIAGNOSIS — R7309 Other abnormal glucose: Secondary | ICD-10-CM

## 2020-08-23 DIAGNOSIS — Z86010 Personal history of colonic polyps: Secondary | ICD-10-CM

## 2020-08-23 DIAGNOSIS — E663 Overweight: Secondary | ICD-10-CM

## 2020-08-23 DIAGNOSIS — I10 Essential (primary) hypertension: Secondary | ICD-10-CM | POA: Diagnosis not present

## 2020-08-23 DIAGNOSIS — Z1159 Encounter for screening for other viral diseases: Secondary | ICD-10-CM

## 2020-08-23 DIAGNOSIS — E559 Vitamin D deficiency, unspecified: Secondary | ICD-10-CM

## 2020-08-23 DIAGNOSIS — G4733 Obstructive sleep apnea (adult) (pediatric): Secondary | ICD-10-CM

## 2020-08-23 DIAGNOSIS — E782 Mixed hyperlipidemia: Secondary | ICD-10-CM

## 2020-08-23 DIAGNOSIS — R7303 Prediabetes: Secondary | ICD-10-CM

## 2020-08-23 DIAGNOSIS — Z9989 Dependence on other enabling machines and devices: Secondary | ICD-10-CM

## 2020-08-23 MED ORDER — BISOPROLOL-HYDROCHLOROTHIAZIDE 5-6.25 MG PO TABS: ORAL_TABLET | ORAL | 3 refills | Status: DC

## 2020-08-23 NOTE — Progress Notes (Signed)
FOLLOW UP  Assessment and Plan:   Hypertension Well controlled with current medications  Monitor blood pressure at home; patient to call if consistently greater than 130/80 Continue DASH diet.   Can get CXR in future- no symptoms at this time Reminder to go to the ER if any CP, SOB, nausea, dizziness, severe HA, changes vision/speech, left arm numbness and tingling and jaw pain.  Cholesterol Currently at LDL goal;taking atorvastatin 40 mg daily, fenofibrate 145 mg daily Consider switch to rosuvastatin if above goal  Continue low cholesterol diet and exercise.  Check lipid panel.   Abnormal glucose Continue diet and exercise.  Perform daily foot/skin check, notify office of any concerning changes.  Check A1C q68m; check CMP today   Overweight with co morbidities Long discussion about weight loss, diet, and exercise Recommended diet heavy in fruits and veggies and low in animal meats, cheeses, and dairy products, appropriate calorie intake Discussed ideal weight for height and initial weight goal (205 lb) Patient will work on increasing exercise, cut down on diet soda, increase water intake, reduce carbs, increase veggies/lean protein  Will follow up in 3 months  Vitamin D Def At goal at last visit; continue supplementation to maintain goal of 60-100  Testosterone deficiency Denies symptoms; on zinc 50 mg daily supplement. Encouraged weight loss and HIIT exercise.   Continue diet and meds as discussed. Further disposition pending results of labs. Discussed med's effects and SE's.   Over 30 minutes of exam, counseling, chart review, and critical decision making was performed.   Future Appointments  Date Time Provider [place]  11/23/2020  4:00 PM [name], MD [facility] None  05/17/2021  2:00 PM [name], MD [facility] None     ----------------------------------------------------------------------------------------------------------------------  HPI 59 y.o. male  presents for 3 month follow up on hypertension, cholesterol, prediabetes, weight and vitamin D deficiency.   He is mostly transitioned back to work, has some flexibility to work from home.   He has OSA and is on CPAP and reports 100% compliance, reports initially perceived restorative sleep, still using machine from 2006. He would like to follow up at some point, but declines for now in light of pandemic. [facility] does provide new tubing periodically.   BMI is Body mass index is 27.71 kg/m., he has been working on diet and exercise, admits irregular, reports weights are up and down at home. Initial weight goal 205 lb. Wt Readings from Last 3 Encounters:  08/23/20 210 lb (95.3 kg)  07/27/20 211 lb 9.6 oz (96 kg)  07/06/20 211 lb 9.6 oz (96 kg)   His blood pressure has been controlled at home on ziac 5 mg, today their BP is BP: 118/70  He does not workout. He denies chest pain, shortness of breath, dizziness.   He is on cholesterol medication (atorvastatin 40 mg daily, fenofibrate 145 mg daily) and denies myalgias. His LDL cholesterol is not at goal; trigs remained elevated at last check. The cholesterol last visit was:   Lab Results  Component Value Date   CHOL 155 05/17/2020   HDL 47 05/17/2020   LDLCALC 83 05/17/2020   TRIG 158 (H) 05/17/2020   CHOLHDL 3.3 05/17/2020    He has not been working on diet and exercise for prediabetes, and denies foot ulcerations, hypoglycemia , increased appetite, nausea, paresthesia of the feet, polydipsia, polyuria, visual disturbances, vomiting and weight loss. Last A1C in the office was:  Lab Results  Component Value Date   HGBA1C 5.8 (H) 05/17/2020  Patient is on Vitamin D supplement and at goal at last check:   Lab Results  Component Value Date   VD25OH 86 05/17/2020     He has a history of  testosterone deficiency, on zinc 50 mg supplement.  Denies notable symptoms of testosterone deficiency. Lab Results  Component Value Date   TESTOSTERONE 354 05/17/2020     Current Medications:  Current Outpatient Medications on File Prior to Visit  Medication Sig  . aspirin 81 MG tablet Take 81 mg by mouth daily.  Marland Kitchen atorvastatin (LIPITOR) 40 MG tablet Take 1 tablet Daily for Cholesterol  . bisoprolol-hydrochlorothiazide (ZIAC) 5-6.25 MG tablet Take 1 tablet Daily for BP  . Cholecalciferol (VITAMIN D-3 PO) Take 10,000 Units by mouth daily.   . Cinnamon 500 MG capsule Take 500 mg by mouth 4 (four) times daily.  . clindamycin (CLINDAGEL) 1 % gel Apply topically 2 (two) times daily.  . fenofibrate (TRICOR) 145 MG tablet Take      1 tablet      Daily       for Triglycerides (Blood Fats)  . fluticasone (FLONASE) 50 MCG/ACT nasal spray Use 1 to 2 sprays each Nostril 1 to 2 x /day  . Multiple Vitamin (MULTIVITAMIN) tablet Take 1 tablet by mouth daily.  . mupirocin cream (BACTROBAN) 2 % Apply to affected area 3 times daily (Patient taking differently: as needed. Apply to affected area 3 times daily)  . Omega-3 Fatty Acids (FISH OIL PO) Take 2,400 mg by mouth 2 (two) times daily.   Marland Kitchen triamcinolone ointment (KENALOG) 0.1 % Apply 1 application topically 2 (two) times daily. (Patient taking differently: Apply 1 application topically as needed. )  . zinc gluconate 50 MG tablet Take 50 mg by mouth daily.   No current facility-administered medications on file prior to visit.     Allergies: No Known Allergies   Medical History:  Past Medical History:  Diagnosis Date  . Anxiety   . Hyperlipidemia   . Hypertension   . Kidney stones    passed stones  . OSA (obstructive sleep apnea)   . Other testicular hypofunction   . Sciatic nerve pain 2010   hx - no current problems per patient 07/06/20  . Sleep apnea    uses cpap nightly  . Unspecified vitamin D deficiency    Family history- Reviewed  and unchanged Social history- Reviewed and unchanged   Review of Systems:  Review of Systems  Constitutional: Negative for malaise/fatigue and weight loss.  HENT: Negative for hearing loss and tinnitus.   Eyes: Negative for blurred vision and double vision.  Respiratory: Negative for cough, shortness of breath and wheezing.   Cardiovascular: Negative for chest pain, palpitations, orthopnea, claudication and leg swelling.  Gastrointestinal: Negative for abdominal pain, blood in stool, constipation, diarrhea, heartburn, melena, nausea and vomiting.  Genitourinary: Negative.   Musculoskeletal: Negative for joint pain and myalgias.  Skin: Negative for rash.  Neurological: Negative for dizziness, tingling, sensory change, weakness and headaches.  Endo/Heme/Allergies: Negative for polydipsia.  Psychiatric/Behavioral: Negative.   All other systems reviewed and are negative.   Physical Exam: BP 118/70   Pulse 61   Temp (!) 97.3 F (36.3 C)   Wt 210 lb (95.3 kg)   SpO2 97%   BMI 27.71 kg/m  Wt Readings from Last 3 Encounters:  08/23/20 210 lb (95.3 kg)  07/27/20 211 lb 9.6 oz (96 kg)  07/06/20 211 lb 9.6 oz (96 kg)   General Appearance: Well nourished,  in no apparent distress. Eyes: PERRLA, EOMs, conjunctiva no swelling or erythema Sinuses: No Frontal/maxillary tenderness ENT/Mouth: Ext aud canals clear, TMs without erythema, bulging. No erythema, swelling, or exudate on post pharynx.  Tonsils not swollen or erythematous. Hearing normal.  Neck: Supple, thyroid normal.  Respiratory: Respiratory effort normal, BS equal bilaterally with coarse breath sounds left lower lobe (? Stomach sounds) without rales, rhonchi, wheezing or stridor.  Cardio: RRR with no MRGs. Brisk peripheral pulses without edema.  Abdomen: Soft, + BS.  Non tender, no guarding, rebound, hernias, masses. Lymphatics: Non tender without lymphadenopathy.  Musculoskeletal: Full ROM, 5/5 strength, Normal gait Skin:  Warm, dry without rashes, lesions, ecchymosis.  Neuro: Cranial nerves intact. No cerebellar symptoms.  Psych: Awake and oriented X 3, normal affect, Insight and Judgment appropriate.    Izora Ribas, NP 3:58 PM [facility]

## 2020-08-23 NOTE — Patient Instructions (Signed)
Goals    . DIET - INCREASE WATER INTAKE     Cut down on diet soda    . Exercise 150 min/wk Moderate Activity    . HEMOGLOBIN A1C < 5.7    . Weight (lb) < 205 lb (93 kg)       Regular exercise in 60s significantly improves mobility, memory, cardiovascular health, immunity (reduced cancer and infections) in 70s, 80s and beyond-   If you don't use it you lose it! Start slow and work to find exercises that you enjoy - playing music or exercising with friend or family can help with consistency     Exercising to Stay Healthy To become healthy and stay healthy, it is recommended that you do moderate-intensity and vigorous-intensity exercise. You can tell that you are exercising at a moderate intensity if your heart starts beating faster and you start breathing faster but can still hold a conversation. You can tell that you are exercising at a vigorous intensity if you are breathing much harder and faster and cannot hold a conversation while exercising. Exercising regularly is important. It has many health benefits, such as:  Improving overall fitness, flexibility, and endurance.  Increasing bone density.  Helping with weight control.  Decreasing body fat.  Increasing muscle strength.  Reducing stress and tension.  Improving overall health. How often should I exercise? Choose an activity that you enjoy, and set realistic goals. Your health care provider can help you make an activity plan that works for you. Exercise regularly as told by your health care provider. This may include:  Doing strength training two times a week, such as: ? Lifting weights. ? Using resistance bands. ? Push-ups. ? Sit-ups. ? Yoga.  Doing a certain intensity of exercise for a given amount of time. Choose from these options: ? A total of 150 minutes of moderate-intensity exercise every week. ? A total of 75 minutes of vigorous-intensity exercise every week. ? A mix of moderate-intensity and  vigorous-intensity exercise every week. Children, pregnant women, people who have not exercised regularly, people who are overweight, and older adults may need to talk with a health care provider about what activities are safe to do. If you have a medical condition, be sure to talk with your health care provider before you start a new exercise program. What are some exercise ideas? Moderate-intensity exercise ideas include:  Walking 1 mile (1.6 km) in about 15 minutes.  Biking.  Hiking.  Golfing.  Dancing.  Water aerobics. Vigorous-intensity exercise ideas include:  Walking 4.5 miles (7.2 km) or more in about 1 hour.  Jogging or running 5 miles (8 km) in about 1 hour.  Biking 10 miles (16.1 km) or more in about 1 hour.  Lap swimming.  Roller-skating or in-line skating.  Cross-country skiing.  Vigorous competitive sports, such as football, basketball, and soccer.  Jumping rope.  Aerobic dancing. What are some everyday activities that can help me to get exercise?  [place] work, such as: ? Pushing a Conservation officer, nature. ? Raking and bagging leaves.  Washing your car.  Pushing a stroller.  Shoveling snow.  Gardening.  Washing windows or floors. How can I be more active in my day-to-day activities?  Use stairs instead of an elevator.  Take a walk during your lunch break.  If you drive, park your car farther away from your work or school.  If you take public transportation, get off one stop early and walk the rest of the way.  Stand up or  walk around during all of your indoor phone calls.  Get up, stretch, and walk around every 30 minutes throughout the day.  Enjoy exercise with a friend. Support to continue exercising will help you keep a regular routine of activity. What guidelines can I follow while exercising?  Before you start a new exercise program, talk with your health care provider.  Do not exercise so much that you hurt yourself, feel dizzy, or get very  short of breath.  Wear comfortable clothes and wear shoes with good support.  Drink plenty of water while you exercise to prevent dehydration or heat stroke.  Work out until your breathing and your heartbeat get faster. Where to find more information  U.S. Department of Health and Human Services: [employer]at  Centers for Disease Control and Prevention (CDC): http://www.wolf.info/ Summary  Exercising regularly is important. It will improve your overall fitness, flexibility, and endurance.  Regular exercise also will improve your overall health. It can help you control your weight, reduce stress, and improve your bone density.  Do not exercise so much that you hurt yourself, feel dizzy, or get very short of breath.  Before you start a new exercise program, talk with your health care provider. This information is not intended to replace advice given to you by your health care provider. Make sure you discuss any questions you have with your health care provider. Document Revised: 09/12/2017 Document Reviewed: 08/21/2017 Elsevier Patient Education  2020 [employer].

## 2020-08-24 ENCOUNTER — Other Ambulatory Visit: Payer: Self-pay | Admitting: Adult Health

## 2020-08-24 LAB — CBC WITH DIFFERENTIAL/PLATELET
Absolute Monocytes: 573 cells/uL (ref 200–950)
Basophils Absolute: 61 cells/uL (ref 0–200)
Basophils Relative: 1.3 %
Eosinophils Absolute: 80 cells/uL (ref 15–500)
Eosinophils Relative: 1.7 %
HCT: 43.1 % (ref 38.5–50.0)
Hemoglobin: 14.4 g/dL (ref 13.2–17.1)
Lymphs Abs: 1495 cells/uL (ref 850–3900)
MCH: 30.1 pg (ref 27.0–33.0)
MCHC: 33.4 g/dL (ref 32.0–36.0)
MCV: 90 fL (ref 80.0–100.0)
MPV: 9.8 fL (ref 7.5–12.5)
Monocytes Relative: 12.2 %
Neutro Abs: 2491 cells/uL (ref 1500–7800)
Neutrophils Relative %: 53 %
Platelets: 323 10*3/uL (ref 140–400)
RBC: 4.79 10*6/uL (ref 4.20–5.80)
RDW: 11.6 % (ref 11.0–15.0)
Total Lymphocyte: 31.8 %
WBC: 4.7 10*3/uL (ref 3.8–10.8)

## 2020-08-24 LAB — COMPLETE METABOLIC PANEL WITH GFR
AG Ratio: 2 (calc) (ref 1.0–2.5)
ALT: 29 U/L (ref 9–46)
AST: 22 U/L (ref 10–35)
Albumin: 4.7 g/dL (ref 3.6–5.1)
Alkaline phosphatase (APISO): 42 U/L (ref 35–144)
BUN: 23 mg/dL (ref 7–25)
CO2: 31 mmol/L (ref 20–32)
Calcium: 10.3 mg/dL (ref 8.6–10.3)
Chloride: 102 mmol/L (ref 98–110)
Creat: 1.08 mg/dL (ref 0.70–1.25)
GFR, Est African American: 85 mL/min/{1.73_m2} (ref >=60)
GFR, Est Non African American: 74 mL/min/{1.73_m2} (ref >=60)
Globulin: 2.3 g/dL (calc) (ref 1.9–3.7)
Glucose, Bld: 94 mg/dL (ref 65–99)
Potassium: 4.3 mmol/L (ref 3.5–5.3)
Sodium: 139 mmol/L (ref 135–146)
Total Bilirubin: 0.5 mg/dL (ref 0.2–1.2)
Total Protein: 7 g/dL (ref 6.1–8.1)

## 2020-08-24 LAB — LIPID PANEL
Cholesterol: 171 mg/dL (ref <200)
HDL: 52 mg/dL (ref >=40)
LDL Cholesterol (Calc): 97 mg/dL (calc)
Non-HDL Cholesterol (Calc): 119 mg/dL (calc) (ref <130)
Total CHOL/HDL Ratio: 3.3 (calc) (ref <5.0)
Triglycerides: 127 mg/dL (ref <150)

## 2020-08-24 LAB — HEPATITIS C ANTIBODY
Hepatitis C Ab: NONREACTIVE
SIGNAL TO CUT-OFF: 0.01 (ref <1.00)

## 2020-08-24 LAB — TSH: TSH: 2.49 mIU/L (ref 0.40–4.50)

## 2020-08-24 LAB — MAGNESIUM: Magnesium: 2.1 mg/dL (ref 1.5–2.5)

## 2020-10-09 ENCOUNTER — Other Ambulatory Visit: Payer: Self-pay | Admitting: Internal Medicine

## 2020-10-09 DIAGNOSIS — E782 Mixed hyperlipidemia: Secondary | ICD-10-CM

## 2020-11-23 ENCOUNTER — Encounter: Payer: Self-pay | Admitting: Internal Medicine

## 2020-11-23 ENCOUNTER — Other Ambulatory Visit: Payer: Self-pay

## 2020-11-23 ENCOUNTER — Ambulatory Visit: Payer: BC Managed Care – PPO | Admitting: Internal Medicine

## 2020-11-23 VITALS — BP 138/86 | HR 63 | Temp 97.2°F | Resp 16 | Ht 73.5 in | Wt 215.6 lb

## 2020-11-23 DIAGNOSIS — E559 Vitamin D deficiency, unspecified: Secondary | ICD-10-CM | POA: Diagnosis not present

## 2020-11-23 DIAGNOSIS — E782 Mixed hyperlipidemia: Secondary | ICD-10-CM

## 2020-11-23 DIAGNOSIS — R7309 Other abnormal glucose: Secondary | ICD-10-CM

## 2020-11-23 DIAGNOSIS — I10 Essential (primary) hypertension: Secondary | ICD-10-CM | POA: Diagnosis not present

## 2020-11-23 DIAGNOSIS — R7303 Prediabetes: Secondary | ICD-10-CM

## 2020-11-23 DIAGNOSIS — Z9989 Dependence on other enabling machines and devices: Secondary | ICD-10-CM

## 2020-11-23 DIAGNOSIS — Z79899 Other long term (current) drug therapy: Secondary | ICD-10-CM | POA: Diagnosis not present

## 2020-11-23 DIAGNOSIS — G4733 Obstructive sleep apnea (adult) (pediatric): Secondary | ICD-10-CM

## 2020-11-23 NOTE — Patient Instructions (Signed)

## 2020-11-23 NOTE — Progress Notes (Signed)
History of Present Illness:       This very nice 60 y.o.  MWM presents for 6 month follow up with HTN, HLD, Pre-Diabetes and Vitamin D Deficiency.        Patient is treated for HTN (2014) & BP has been controlled at home. Today's BP is at goal -  138/86. Patient has had no complaints of any cardiac type chest pain, palpitations, dyspnea / orthopnea / PND, dizziness, claudication, or dependent edema.patient has OSA on CPAP with improved restorative sleep.       Hyperlipidemia is controlled with diet & Atorvastatin & Fenofibrate. Patient denies myalgias or other med SE's. Last Lipids were at goal:  Lab Results  Component Value Date   CHOL 171 08/23/2020   HDL 52 08/23/2020   LDLCALC 97 08/23/2020   TRIG 127 08/23/2020   CHOLHDL 3.3 08/23/2020    Also, the patient has history of PreDiabetes(A1c 5.8%/2014 and 5.9% /2020)and has had no symptoms of reactive hypoglycemia, diabetic polys, paresthesias or visual blurring.  Last A1c was not at goal:  Lab Results  Component Value Date   HGBA1C 5.8 (H) 05/17/2020       Further, the patient also has history of Vitamin D Deficiency("47" /2014)and supplements vitamin D without any suspected side-effects. Last vitamin D was at goal:  Lab Results  Component Value Date   VD25OH 86 05/17/2020    Current Outpatient Medications on File Prior to Visit  Medication Sig  . aspirin 81 MG tablet Take  daily.  Marland Kitchen atorvastatin 40 MG tablet Take 1 tablet Daily  . bisoprolol-hctz 5-6.25 MG  Take 1 tablet Daily for BP  . VITAMIN D Take 10,000 Units  daily.   . Cinnamon 500 MG  Take  4  times daily.  . clindamycin 1 % gel Apply topically 2 times daily.  . fenofibrate 145 MG  Take 1 tablet Daily  .  FLONASE  nasal spray Use 1 to 2 sprays each Nostril 1 to 2 x /day  . Multiple Vitamin  Take 1 tablet by mouth daily.  . Omega-3 FISH OIL Take 2,400 mg 2  times daily.   Marland Kitchen triamcinolone oint  0.1 % Apply  topically 2  times daily  . zinc  50 MG  tablet Take  daily.    No Known Allergies  PMHx:   Past Medical History:  Diagnosis Date  . Anxiety   . Hyperlipidemia   . Hypertension   . Kidney stones    passed stones  . OSA (obstructive sleep apnea)   . Other testicular hypofunction   . Sciatic nerve pain 2010   hx - no current problems per patient 07/06/20  . Sleep apnea    uses cpap nightly  . Unspecified vitamin D deficiency     Immunization History  Administered Date(s) Administered  . Influenza Inj Mdck Quad  08/10/2020  . Influenza Inj Mdck Quad 09/03/2017, 07/28/2018  . Influenza Split 08/23/2014  . Influenza Whole 08/09/2013  . Influenza 07/30/2016  . Influenza 08/01/2019, 08/10/2020  . PFIZERSARS-COV-2 Vacc 01/20/2020, 07/31/2020  . PPD Test 05/10/2019, 05/17/2020  . Pneumococcal Conjugate-13 08/23/2014  . Tdap 01/17/2016  . Zoster Recombinat (Shingrix) 09/12/2019, 11/20/2019    Past Surgical History:  Procedure Laterality Date  . COLONOSCOPY  04/13/2010  . HERNIA REPAIR  1970 Left groin  . HERNIA REPAIR  2011 Right groin  . IN & D PERIRECTAL ABSCESS x 2    . MELANOMA EXCISION upper  back -mid right side  2009  . NASAL SEPTUM SURGERY    . SHOULDER ARTHROSCOPY Left 2003  . WISDOM TOOTH EXTRACTION  1976    FHx:    Reviewed / unchanged  SHx:    Reviewed / unchanged   Systems Review:  Constitutional: Denies fever, chills, wt changes, headaches, insomnia, fatigue, night sweats, change in appetite. Eyes: Denies redness, blurred vision, diplopia, discharge, itchy, watery eyes.  ENT: Denies discharge, congestion, post nasal drip, epistaxis, sore throat, earache, hearing loss, dental pain, tinnitus, vertigo, sinus pain, snoring.  CV: Denies chest pain, palpitations, irregular heartbeat, syncope, dyspnea, diaphoresis, orthopnea, PND, claudication or edema. Respiratory: denies cough, dyspnea, DOE, pleurisy, hoarseness, laryngitis, wheezing.  Gastrointestinal: Denies dysphagia, odynophagia, heartburn,  reflux, water brash, abdominal pain or cramps, nausea, vomiting, bloating, diarrhea, constipation, hematemesis, melena, hematochezia  or hemorrhoids. Genitourinary: Denies dysuria, frequency, urgency, nocturia, hesitancy, discharge, hematuria or flank pain. Musculoskeletal: Denies arthralgias, myalgias, stiffness, jt. swelling, pain, limping or strain/sprain.  Skin: Denies pruritus, rash, hives, warts, acne, eczema or change in skin lesion(s). Neuro: No weakness, tremor, incoordination, spasms, paresthesia or pain. Psychiatric: Denies confusion, memory loss or sensory loss. Endo: Denies change in weight, skin or hair change.  Heme/Lymph: No excessive bleeding, bruising or enlarged lymph nodes.  Physical Exam  BP 138/86   Pulse 63   Temp (!) 97.2 F (36.2 C)   Resp 16   Ht 6' 1.5" (1.867 m)   Wt 215 lb 9.6 oz (97.8 kg)   SpO2 96%   BMI 28.06 kg/m   Appears  well nourished, well groomed  and in no distress.  Eyes: PERRLA, EOMs, conjunctiva no swelling or erythema. Sinuses: No frontal/maxillary tenderness ENT/Mouth: EAC's clear, TM's nl w/o erythema, bulging. Nares clear w/o erythema, swelling, exudates. Oropharynx clear without erythema or exudates. Oral hygiene is good. Tongue normal, non obstructing. Hearing intact.  Neck: Supple. Thyroid not palpable. Car 2+/2+ without bruits, nodes or JVD. Chest: Respirations nl with BS clear & equal w/o rales, rhonchi, wheezing or stridor.  Cor: Heart sounds normal w/ regular rate and rhythm without sig. murmurs, gallops, clicks or rubs. Peripheral pulses normal and equal  without edema.  Abdomen: Soft & bowel sounds normal. Non-tender w/o guarding, rebound, hernias, masses or organomegaly.  Lymphatics: Unremarkable.  Musculoskeletal: Full ROM all peripheral extremities, joint stability, 5/5 strength and normal gait.  Skin: Warm, dry without exposed rashes, lesions or ecchymosis apparent.  Neuro: Cranial nerves intact, reflexes equal bilaterally.  Sensory-motor testing grossly intact. Tendon reflexes grossly intact.  Pysch: Alert & oriented x 3.  Insight and judgement nl & appropriate. No ideations.  Assessment and Plan:  1. Essential hypertension  - Continue medication, monitor blood pressure at home.  - Continue DASH diet.  Reminder to go to the ER if any CP,  SOB, nausea, dizziness, severe HA, changes vision/speech.  - CBC with Differential/Platelet - Magnesium - TSH - COMPLETE METABOLIC PANEL WITH GFR  2. Hyperlipidemia, mixed  - Continue diet/meds, exercise,& lifestyle modifications.  - Continue monitor periodic cholesterol/liver & renal functions   - Lipid panel - TSH  3. Abnormal glucose  - Continue diet, exercise  - Lifestyle modifications.  - Monitor appropriate labs  - Hemoglobin A1c - Insulin, random  4. Vitamin D deficiency  - Continue supplementation.  - VITAMIN D 25 Hydroxyl  5. Prediabetes  - Hemoglobin A1c - Insulin, random  6. OSA on CPAP   7. Medication management  - CBC with Differential/Platelet - Magnesium - Lipid panel -  TSH - Hemoglobin A1c - Insulin, random - VITAMIN D 25 Hydroxyl - COMPLETE METABOLIC PANEL WITH GFR       Discussed  regular exercise, BP monitoring, weight control to achieve/maintain BMI less than 25 and discussed med and SE's. Recommended labs to assess and monitor clinical status with further disposition pending results of labs.  I discussed the assessment and treatment plan with the patient. The patient was provided an opportunity to ask questions and all were answered. The patient agreed with the plan and demonstrated an understanding of the instructions.  I provided over 30 minutes of exam, counseling, chart review and  complex critical decision making.       The patient was advised to call back or seek an in-person evaluation if the symptoms worsen or if the condition fails to improve as anticipated.   Kirtland Bouchard, MD

## 2020-11-24 LAB — COMPLETE METABOLIC PANEL WITH GFR
AG Ratio: 2 (calc) (ref 1.0–2.5)
ALT: 26 U/L (ref 9–46)
AST: 22 U/L (ref 10–35)
Albumin: 4.6 g/dL (ref 3.6–5.1)
Alkaline phosphatase (APISO): 43 U/L (ref 35–144)
BUN: 23 mg/dL (ref 7–25)
CO2: 31 mmol/L (ref 20–32)
Calcium: 10.4 mg/dL — ABNORMAL HIGH (ref 8.6–10.3)
Chloride: 102 mmol/L (ref 98–110)
Creat: 1.18 mg/dL (ref 0.70–1.25)
GFR, Est African American: 77 mL/min/{1.73_m2} (ref >=60)
GFR, Est Non African American: 66 mL/min/{1.73_m2} (ref >=60)
Globulin: 2.3 g/dL (calc) (ref 1.9–3.7)
Glucose, Bld: 101 mg/dL — ABNORMAL HIGH (ref 65–99)
Potassium: 4.3 mmol/L (ref 3.5–5.3)
Sodium: 140 mmol/L (ref 135–146)
Total Bilirubin: 0.6 mg/dL (ref 0.2–1.2)
Total Protein: 6.9 g/dL (ref 6.1–8.1)

## 2020-11-24 LAB — CBC WITH DIFFERENTIAL/PLATELET
Absolute Monocytes: 578 cells/uL (ref 200–950)
Basophils Absolute: 72 cells/uL (ref 0–200)
Basophils Relative: 1.3 %
Eosinophils Absolute: 99 cells/uL (ref 15–500)
Eosinophils Relative: 1.8 %
HCT: 43 % (ref 38.5–50.0)
Hemoglobin: 14.6 g/dL (ref 13.2–17.1)
Lymphs Abs: 1639 cells/uL (ref 850–3900)
MCH: 30.6 pg (ref 27.0–33.0)
MCHC: 34 g/dL (ref 32.0–36.0)
MCV: 90.1 fL (ref 80.0–100.0)
MPV: 9.8 fL (ref 7.5–12.5)
Monocytes Relative: 10.5 %
Neutro Abs: 3113 cells/uL (ref 1500–7800)
Neutrophils Relative %: 56.6 %
Platelets: 347 10*3/uL (ref 140–400)
RBC: 4.77 10*6/uL (ref 4.20–5.80)
RDW: 11.7 % (ref 11.0–15.0)
Total Lymphocyte: 29.8 %
WBC: 5.5 10*3/uL (ref 3.8–10.8)

## 2020-11-24 LAB — HEMOGLOBIN A1C
Hgb A1c MFr Bld: 5.8 % of total Hgb — ABNORMAL HIGH (ref <5.7)
Mean Plasma Glucose: 120 mg/dL
eAG (mmol/L): 6.6 mmol/L

## 2020-11-24 LAB — VITAMIN D 25 HYDROXY (VIT D DEFICIENCY, FRACTURES): Vit D, 25-Hydroxy: 75 ng/mL (ref 30–100)

## 2020-11-24 LAB — LIPID PANEL
Cholesterol: 160 mg/dL (ref <200)
HDL: 46 mg/dL (ref >=40)
LDL Cholesterol (Calc): 91 mg/dL (calc)
Non-HDL Cholesterol (Calc): 114 mg/dL (calc) (ref <130)
Total CHOL/HDL Ratio: 3.5 (calc) (ref <5.0)
Triglycerides: 125 mg/dL (ref <150)

## 2020-11-24 LAB — INSULIN, RANDOM: Insulin: 15.6 u[IU]/mL

## 2020-11-24 LAB — MAGNESIUM: Magnesium: 2 mg/dL (ref 1.5–2.5)

## 2020-11-24 LAB — TSH: TSH: 1.96 mIU/L (ref 0.40–4.50)

## 2020-11-25 NOTE — Progress Notes (Signed)
========================================================== -   Test results slightly outside the reference range are not unusual. If there is anything important, I will review this with you,  otherwise it is considered normal test values.  If you have further questions,  please do not hesitate to contact me at the office or via My Chart.  ========================================================== ==========================================================  -  Total Chol = 160  and LDL Chol = 91   -  Both  Excellent   - Very low risk for Heart Attack  / Stroke ========================================================== ==========================================================  -  A1c = 5.8% - still borderline elevated    - Avoid Sweets, Candy & White Stuff   - Rice, Potatoes, Breads &  Pasta ========================================================== ==========================================================  -  Vitamin D = 75 - Excellent  ! ========================================================== ==========================================================  -  All Else - CBC - Kidneys - Electrolytes - Liver - Magnesium & Thyroid    - all  Normal / OK ========================================================== ==========================================================  -  Keep up the [place] Work  ! ========================================================== ==========================================================

## 2020-11-26 ENCOUNTER — Encounter: Payer: Self-pay | Admitting: Internal Medicine

## 2021-01-22 ENCOUNTER — Other Ambulatory Visit: Payer: Self-pay | Admitting: Internal Medicine

## 2021-01-22 DIAGNOSIS — E782 Mixed hyperlipidemia: Secondary | ICD-10-CM

## 2021-02-26 ENCOUNTER — Ambulatory Visit: Payer: BC Managed Care – PPO | Admitting: Adult Health

## 2021-03-24 ENCOUNTER — Encounter: Payer: Self-pay | Admitting: Adult Health

## 2021-03-24 NOTE — Progress Notes (Signed)
FOLLOW UP  Assessment and Plan:   Hypertension Atypically elevated;  Monitor blood pressure at home; patient to call if consistently greater than 130/80 Continue DASH diet.   Reminder to go to the ER if any CP, SOB, nausea, dizziness, severe HA, changes vision/speech, left arm numbness and tingling and jaw pain.  Cholesterol Currently at LDL goal; taking atorvastatin 40 mg daily, fenofibrate 145 mg daily Consider switch to rosuvastatin if above goal  Continue low cholesterol diet and exercise.  Check lipid panel.   Abnormal glucose Continue diet and exercise.  Perform daily foot/skin check, notify office of any concerning changes.  Check A1C q42m; check CMP today   Overweight with co morbidities Long discussion about weight loss, diet, and exercise Recommended diet heavy in fruits and veggies and low in animal meats, cheeses, and dairy products, appropriate calorie intake Discussed ideal weight for height and initial weight goal (205 lb) Patient will work on increasing exercise- plan to start with PT at [facility], phone number given on discharge paperwork Will follow up in 3 months  Vitamin D Def At goal at last visit; continue supplementation to maintain goal of 60-100  Testosterone deficiency Denies symptoms; on zinc 50 mg daily supplement. Encouraged weight loss and HIIT exercise.   Continue diet and meds as discussed. Further disposition pending results of labs. Discussed med's effects and SE's.   Over 30 minutes of exam, counseling, chart review, and critical decision making was performed.   Future Appointments  Date Time Provider [place]  05/17/2021  2:00 PM [name], MD [facility] None    ----------------------------------------------------------------------------------------------------------------------  HPI 60 y.o. male  presents for 3 month follow up on hypertension, cholesterol, prediabetes, weight and vitamin D deficiency.   He is  mostly transitioned back to work, flexible.   He has OSA and is on CPAP and reports 100% compliance, reports restorative sleep, still using machine from 2006. He would like to follow up at some point, but declines for now in light of pandemic. [facility] does provide new tubing periodically.   BMI is Body mass index is 28.29 kg/m., he has been working on diet and exercise, admits irregular, reports weights are up and down at home.  Wt Readings from Last 3 Encounters:  03/26/21 217 lb 6.4 oz (98.6 kg)  11/23/20 215 lb 9.6 oz (97.8 kg)  08/23/20 210 lb (95.3 kg)   His blood pressure has been controlled at home on ziac 5 mg, today their BP is BP: (!) 146/84  He does not workout. He denies chest pain, shortness of breath, dizziness.   He is on cholesterol medication (atorvastatin 40 mg daily, fenofibrate 145 mg daily) and denies myalgias. His LDL cholesterol is at goal; trigs remained elevated at last check. The cholesterol last visit was:   Lab Results  Component Value Date   CHOL 160 11/23/2020   HDL 46 11/23/2020   LDLCALC 91 11/23/2020   TRIG 125 11/23/2020   CHOLHDL 3.5 11/23/2020    He has not been working on diet and exercise for prediabetes, and denies foot ulcerations, hypoglycemia , increased appetite, nausea, paresthesia of the feet, polydipsia, polyuria, visual disturbances, vomiting and weight loss.  Last A1C in the office was:  Lab Results  Component Value Date   HGBA1C 5.8 (H) 11/23/2020   Patient is on Vitamin D supplement and at goal at last check:   Lab Results  Component Value Date   VD25OH 73 11/23/2020     He has a  history of testosterone deficiency, on zinc 50 mg supplement. Denies notable symptoms of testosterone deficiency. Lab Results  Component Value Date   TESTOSTERONE 354 05/17/2020     Current Medications:  Current Outpatient Medications on File Prior to Visit  Medication Sig   aspirin 81 MG tablet Take 81 mg by mouth daily.   atorvastatin  (LIPITOR) 40 MG tablet TAKE 1 TABLET BY MOUTH DAILY FOR CHOLESTEROL   bisoprolol-hydrochlorothiazide (ZIAC) 5-6.25 MG tablet Take 1 tablet Daily for BP   Cholecalciferol (VITAMIN D-3 PO) Take 10,000 Units by mouth daily.    Cinnamon 500 MG capsule Take 500 mg by mouth 4 (four) times daily.   clindamycin (CLINDAGEL) 1 % gel Apply topically 2 (two) times daily.   fenofibrate (TRICOR) 145 MG tablet TAKE 1 TABLET BY MOUTH DAILY FOR TRIGLYCERIDES( BLOOD FATS)   fluticasone (FLONASE) 50 MCG/ACT nasal spray Use 1 to 2 sprays each Nostril 1 to 2 x /day   Multiple Vitamin (MULTIVITAMIN) tablet Take 1 tablet by mouth daily.   Omega-3 Fatty Acids (FISH OIL PO) Take 2,400 mg by mouth 2 (two) times daily.    triamcinolone ointment (KENALOG) 0.1 % Apply 1 application topically 2 (two) times daily. (Patient taking differently: Apply 1 application topically as needed.)   zinc gluconate 50 MG tablet Take 50 mg by mouth daily.   No current facility-administered medications on file prior to visit.     Allergies: No Known Allergies   Medical History:  Past Medical History:  Diagnosis Date   Anxiety    Hyperlipidemia    Hypertension    Kidney stones    passed stones   OSA (obstructive sleep apnea)    Other testicular hypofunction    Prediabetes 05/09/2019   Sciatic nerve pain 2010   hx - no current problems per patient 07/06/20   Sleep apnea    uses cpap nightly   Unspecified vitamin D deficiency    Family history- Reviewed and unchanged Social history- Reviewed and unchanged   Review of Systems:  Review of Systems  Constitutional:  Negative for malaise/fatigue and weight loss.  HENT:  Negative for hearing loss and tinnitus.   Eyes:  Negative for blurred vision and double vision.  Respiratory:  Negative for cough, shortness of breath and wheezing.   Cardiovascular:  Negative for chest pain, palpitations, orthopnea, claudication and leg swelling.  Gastrointestinal:  Negative for abdominal pain,  blood in stool, constipation, diarrhea, heartburn, melena, nausea and vomiting.  Genitourinary: Negative.   Musculoskeletal:  Negative for joint pain and myalgias.  Skin:  Negative for rash.  Neurological:  Negative for dizziness, tingling, sensory change, weakness and headaches.  Endo/Heme/Allergies:  Negative for polydipsia.  Psychiatric/Behavioral: Negative.    All other systems reviewed and are negative.  Physical Exam: BP (!) 146/84   Pulse 69   Temp (!) 97.2 F (36.2 C)   Wt 217 lb 6.4 oz (98.6 kg)   SpO2 94%   BMI 28.29 kg/m  Wt Readings from Last 3 Encounters:  03/26/21 217 lb 6.4 oz (98.6 kg)  11/23/20 215 lb 9.6 oz (97.8 kg)  08/23/20 210 lb (95.3 kg)   General Appearance: Well nourished, in no apparent distress. Eyes: PERRLA, EOMs, conjunctiva no swelling or erythema Sinuses: No Frontal/maxillary tenderness ENT/Mouth: Ext aud canals clear, TMs without erythema, bulging. No erythema, swelling, or exudate on post pharynx.  Tonsils not swollen or erythematous. Hearing normal.  Neck: Supple, thyroid normal.  Respiratory: Respiratory effort normal, BS equal bilaterally without  rales, rhonchi, wheezing or stridor.  Cardio: RRR with no MRGs. Brisk peripheral pulses without edema. Left thigh with non-tender varicose veins.  Abdomen: Soft, + BS.  Non tender, no guarding, rebound, hernias, masses. Lymphatics: Non tender without lymphadenopathy.  Musculoskeletal: Full ROM, 5/5 strength, Normal gait Skin: Warm, dry without rashes, lesions, ecchymosis.  Neuro: Cranial nerves intact. No cerebellar symptoms.  Psych: Awake and oriented X 3, normal affect, Insight and Judgment appropriate.    Izora Ribas, NP 4:09 PM [facility]

## 2021-03-26 ENCOUNTER — Encounter: Payer: Self-pay | Admitting: Adult Health

## 2021-03-26 ENCOUNTER — Other Ambulatory Visit: Payer: Self-pay

## 2021-03-26 ENCOUNTER — Ambulatory Visit: Payer: BC Managed Care – PPO | Admitting: Adult Health

## 2021-03-26 VITALS — BP 146/84 | HR 69 | Temp 97.2°F | Wt 217.4 lb

## 2021-03-26 DIAGNOSIS — I83893 Varicose veins of bilateral lower extremities with other complications: Secondary | ICD-10-CM | POA: Insufficient documentation

## 2021-03-26 DIAGNOSIS — E782 Mixed hyperlipidemia: Secondary | ICD-10-CM | POA: Diagnosis not present

## 2021-03-26 DIAGNOSIS — R7309 Other abnormal glucose: Secondary | ICD-10-CM | POA: Diagnosis not present

## 2021-03-26 DIAGNOSIS — E663 Overweight: Secondary | ICD-10-CM

## 2021-03-26 DIAGNOSIS — Z79899 Other long term (current) drug therapy: Secondary | ICD-10-CM | POA: Diagnosis not present

## 2021-03-26 DIAGNOSIS — I10 Essential (primary) hypertension: Secondary | ICD-10-CM

## 2021-03-26 DIAGNOSIS — G4733 Obstructive sleep apnea (adult) (pediatric): Secondary | ICD-10-CM | POA: Diagnosis not present

## 2021-03-26 DIAGNOSIS — Z9989 Dependence on other enabling machines and devices: Secondary | ICD-10-CM

## 2021-03-26 DIAGNOSIS — E559 Vitamin D deficiency, unspecified: Secondary | ICD-10-CM

## 2021-03-26 DIAGNOSIS — I8392 Asymptomatic varicose veins of left lower extremity: Secondary | ICD-10-CM

## 2021-03-26 DIAGNOSIS — E349 Endocrine disorder, unspecified: Secondary | ICD-10-CM

## 2021-03-26 NOTE — Patient Instructions (Addendum)
Goals      Blood Pressure < 130/80     DIET - INCREASE WATER INTAKE     Cut down on diet soda      Exercise 150 min/wk Moderate Activity     HEMOGLOBIN A1C < 5.7     Weight (lb) < 205 lb (93 kg)         [email or web address] [address] [place], [place] [address]   [phone]     HYPERTENSION INFORMATION  Monitor your blood pressure at home, please keep a record and bring that in with you to your next office visit.   Go to the ER if any CP, SOB, nausea, dizziness, severe HA, changes vision/speech  Testing/Procedures: HOW TO TAKE YOUR BLOOD PRESSURE: Rest 5 minutes before taking your blood pressure. Don't smoke or drink caffeinated beverages for at least 30 minutes before. Take your blood pressure before (not after) you eat. Sit comfortably with your back supported and both feet on the floor (don't cross your legs). Elevate your arm to heart level on a table or a desk. Use the proper sized cuff. It should fit smoothly and snugly around your bare upper arm. There should be enough room to slip a fingertip under the cuff. The bottom edge of the cuff should be 1 inch above the crease of the elbow.  Your most recent BP: BP: (!) 146/84   Take your medications faithfully as instructed. Maintain a healthy weight. Get at least 150 minutes of aerobic exercise per week. Minimize salt intake. Minimize alcohol intake  DASH Eating Plan DASH stands for "Dietary Approaches to Stop Hypertension." The DASH eating plan is a healthy eating plan that has been shown to reduce high blood pressure (hypertension). Additional health benefits may include reducing the risk of type 2 diabetes mellitus, heart disease, and stroke. The DASH eating plan may also help with weight loss. WHAT DO I NEED TO KNOW ABOUT THE DASH EATING PLAN? For the DASH eating plan, you will follow these general guidelines: Choose foods with a percent daily value for sodium of less than 5% (as  listed on the food label). Use salt-free seasonings or herbs instead of table salt or sea salt. Check with your health care provider or pharmacist before using salt substitutes. Eat lower-sodium products, often labeled as "lower sodium" or "no salt added." Eat fresh foods. Eat more vegetables, fruits, and low-fat dairy products. Choose whole grains. Look for the word "whole" as the first word in the ingredient list. Choose fish and skinless chicken or [place] more often than red meat. Limit fish, poultry, and meat to 6 oz (170 g) each day. Limit sweets, desserts, sugars, and sugary drinks. Choose heart-healthy fats. Limit cheese to 1 oz (28 g) per day. Eat more home-cooked food and less restaurant, buffet, and fast food. Limit fried foods. Cook foods using methods other than frying. Limit canned vegetables. If you do use them, rinse them well to decrease the sodium. When eating at a restaurant, ask that your food be prepared with less salt, or no salt if possible. WHAT FOODS CAN I EAT? Seek help from a dietitian for individual calorie needs. Grains Whole grain or whole wheat bread. Brown rice. Whole grain or whole wheat pasta. Quinoa, bulgur, and whole grain cereals. Low-sodium cereals. Corn or whole wheat flour tortillas. Whole grain cornbread. Whole grain crackers. Low-sodium crackers. Vegetables Fresh or frozen vegetables (raw, steamed, roasted, or grilled). Low-sodium or reduced-sodium tomato and vegetable juices. Low-sodium or reduced-sodium tomato  sauce and paste. Low-sodium or reduced-sodium canned vegetables.  Fruits All fresh, canned (in natural juice), or frozen fruits. Meat and Other Protein Products Ground beef (85% or leaner), grass-fed beef, or beef trimmed of fat. Skinless chicken or [place]. Ground chicken or [place]. Pork trimmed of fat. All fish and seafood. Eggs. Dried beans, peas, or lentils. Unsalted nuts and seeds. Unsalted canned beans. Dairy Low-fat dairy products,  such as skim or 1% milk, 2% or reduced-fat cheeses, low-fat ricotta or cottage cheese, or plain low-fat yogurt. Low-sodium or reduced-sodium cheeses. Fats and Oils Tub margarines without trans fats. Light or reduced-fat mayonnaise and salad dressings (reduced sodium). Avocado. Safflower, olive, or canola oils. Natural peanut or almond butter. Other Unsalted popcorn and pretzels. The items listed above may not be a complete list of recommended foods or beverages. Contact your dietitian for more options. WHAT FOODS ARE NOT RECOMMENDED? Grains White bread. White pasta. White rice. Refined cornbread. Bagels and croissants. Crackers that contain trans fat. Vegetables Creamed or fried vegetables. Vegetables in a cheese sauce. Regular canned vegetables. Regular canned tomato sauce and paste. Regular tomato and vegetable juices. Fruits Dried fruits. Canned fruit in light or heavy syrup. Fruit juice. Meat and Other Protein Products Fatty cuts of meat. Ribs, chicken wings, bacon, sausage, bologna, salami, chitterlings, fatback, hot dogs, bratwurst, and packaged luncheon meats. Salted nuts and seeds. Canned beans with salt. Dairy Whole or 2% milk, cream, half-and-half, and cream cheese. Whole-fat or sweetened yogurt. Full-fat cheeses or blue cheese. Nondairy creamers and whipped toppings. Processed cheese, cheese spreads, or cheese curds. Condiments Onion and garlic salt, seasoned salt, table salt, and sea salt. Canned and packaged gravies. Worcestershire sauce. Tartar sauce. Barbecue sauce. Teriyaki sauce. Soy sauce, including reduced sodium. Steak sauce. Fish sauce. Oyster sauce. Cocktail sauce. Horseradish. Ketchup and mustard. Meat flavorings and tenderizers. Bouillon cubes. Hot sauce. Tabasco sauce. Marinades. Taco seasonings. Relishes. Fats and Oils Butter, stick margarine, lard, shortening, ghee, and bacon fat. Coconut, palm kernel, or palm oils. Regular salad dressings. Other Pickles and olives.  Salted popcorn and pretzels. The items listed above may not be a complete list of foods and beverages to avoid. Contact your dietitian for more information. WHERE CAN I FIND MORE INFORMATION? National [employer], [employer], [employer] Institute: travelstabloid.com Document Released: 09/19/2011 Document Revised: 02/14/2014 Document Reviewed: 08/04/2013 [facility] Patient Information 2015 [place], [place]. This information is not intended to replace advice given to you by your health care provider. Make sure you discuss any questions you have with your health care provider.

## 2021-03-27 LAB — CBC WITH DIFFERENTIAL/PLATELET
Absolute Monocytes: 738 cells/uL (ref 200–950)
Basophils Absolute: 78 cells/uL (ref 0–200)
Basophils Relative: 1.1 %
Eosinophils Absolute: 78 cells/uL (ref 15–500)
Eosinophils Relative: 1.1 %
HCT: 45.5 % (ref 38.5–50.0)
Hemoglobin: 15.2 g/dL (ref 13.2–17.1)
Lymphs Abs: 1768 cells/uL (ref 850–3900)
MCH: 30.2 pg (ref 27.0–33.0)
MCHC: 33.4 g/dL (ref 32.0–36.0)
MCV: 90.5 fL (ref 80.0–100.0)
MPV: 9.9 fL (ref 7.5–12.5)
Monocytes Relative: 10.4 %
Neutro Abs: 4438 cells/uL (ref 1500–7800)
Neutrophils Relative %: 62.5 %
Platelets: 358 10*3/uL (ref 140–400)
RBC: 5.03 10*6/uL (ref 4.20–5.80)
RDW: 11.6 % (ref 11.0–15.0)
Total Lymphocyte: 24.9 %
WBC: 7.1 10*3/uL (ref 3.8–10.8)

## 2021-03-27 LAB — COMPLETE METABOLIC PANEL WITH GFR
AG Ratio: 1.8 (calc) (ref 1.0–2.5)
ALT: 28 U/L (ref 9–46)
AST: 23 U/L (ref 10–35)
Albumin: 4.7 g/dL (ref 3.6–5.1)
Alkaline phosphatase (APISO): 44 U/L (ref 35–144)
BUN/Creatinine Ratio: 20 (calc) (ref 6–22)
BUN: 25 mg/dL (ref 7–25)
CO2: 32 mmol/L (ref 20–32)
Calcium: 10.4 mg/dL — ABNORMAL HIGH (ref 8.6–10.3)
Chloride: 100 mmol/L (ref 98–110)
Creat: 1.26 mg/dL — ABNORMAL HIGH (ref 0.70–1.25)
GFR, Est African American: 71 mL/min/{1.73_m2} (ref >=60)
GFR, Est Non African American: 61 mL/min/{1.73_m2} (ref >=60)
Globulin: 2.6 g/dL (calc) (ref 1.9–3.7)
Glucose, Bld: 113 mg/dL — ABNORMAL HIGH (ref 65–99)
Potassium: 4.4 mmol/L (ref 3.5–5.3)
Sodium: 139 mmol/L (ref 135–146)
Total Bilirubin: 0.5 mg/dL (ref 0.2–1.2)
Total Protein: 7.3 g/dL (ref 6.1–8.1)

## 2021-03-27 LAB — MAGNESIUM: Magnesium: 2 mg/dL (ref 1.5–2.5)

## 2021-03-27 LAB — TSH: TSH: 2.15 mIU/L (ref 0.40–4.50)

## 2021-03-27 LAB — LIPID PANEL
Cholesterol: 166 mg/dL (ref <200)
HDL: 49 mg/dL (ref >=40)
LDL Cholesterol (Calc): 86 mg/dL (calc)
Non-HDL Cholesterol (Calc): 117 mg/dL (calc) (ref <130)
Total CHOL/HDL Ratio: 3.4 (calc) (ref <5.0)
Triglycerides: 226 mg/dL — ABNORMAL HIGH (ref <150)

## 2021-04-02 ENCOUNTER — Other Ambulatory Visit: Payer: Self-pay | Admitting: Adult Health

## 2021-04-02 DIAGNOSIS — I8390 Asymptomatic varicose veins of unspecified lower extremity: Secondary | ICD-10-CM

## 2021-04-19 ENCOUNTER — Other Ambulatory Visit: Payer: Self-pay

## 2021-04-19 DIAGNOSIS — I8393 Asymptomatic varicose veins of bilateral lower extremities: Secondary | ICD-10-CM

## 2021-04-25 NOTE — Progress Notes (Signed)
[facility]           [facility] [place]  History and Physical   Tony Bailey is a 60 y.o. male who presents with hx of left leg varicose veins & accompanied by his wife of 3 years.  He states that it has gotten bigger over the past 6-12 months.  He has itching, throbbing and warmth over the vein.  He states he does have swelling in the left leg.  He does wear 15-59mmHg knee high compression socks.  His mother had hx of varicose veins.  He does have some skin color changes on his lower legs.  He also has spider veins around his ankles bilaterally.  He did have a bleeding episode from one of these areas recently.  He has never had a DVT.   He does not have any claudication sx but does have occasional cramps that he takes in vinegar, which helps with the cramping.   His family hx - mother died at age 75 from brain aneurysm.  His father had hx of CAD with open heart surgery in his 48's.  His father also had AAA repair.  He gets checked every year.   The pt is on a statin for cholesterol management.  The pt is on a daily aspirin.   Other AC:  none The pt is not on medication for hypertension.   The pt is not diabetic.   Tobacco hx:  never   Past Medical History:  Diagnosis Date   Anxiety    Hyperlipidemia    Hypertension    Kidney stones    passed stones   OSA (obstructive sleep apnea)    Other testicular hypofunction    Prediabetes 05/09/2019   Sciatic nerve pain 2010   hx - no current problems per patient 07/06/20   Sleep apnea    uses cpap nightly   Unspecified vitamin D deficiency     Past Surgical History:  Procedure Laterality Date   COLONOSCOPY  04/13/2010   [facility] Left groin   1970   HERNIA REPAIR Right groin   2011   INCISION AND DRAINAGE PERIRECTAL ABSCESS     x 2   MELANOMA EXCISION  2009   melanona  2009   upper back -mid right side   NASAL SEPTUM SURGERY     SHOULDER ARTHROSCOPY Left 2003   WISDOM TOOTH  EXTRACTION  1976    Social History   Socioeconomic History   Marital status: Married    Spouse name: Not on file   Number of children: Not on file   Years of education: Not on file   Highest education level: Not on file  Occupational History   Not on file  Tobacco Use   Smoking status: Never   Smokeless tobacco: Never  Substance and Sexual Activity   Alcohol use: No   Drug use: No   Sexual activity: Yes  Other Topics Concern   Not on file  Social History Narrative   Not on file   Social Determinants of Health   Financial Resource Strain: Not on file  Food Insecurity: Not on file  Transportation Needs: Not on file  Physical Activity: Not on file  Stress: Not on file  Social Connections: Not on file  Intimate Partner Violence: Not on file    Family History  Problem Relation Age of Onset   Aneurysm Mother  Kidney disease Father    Kidney failure Father    Prostate cancer Father    Colon cancer Neg Hx    Stomach cancer Neg Hx    Rectal cancer Neg Hx     Current Outpatient Medications  Medication Sig Dispense Refill   aspirin 81 MG tablet Take 81 mg by mouth daily.     atorvastatin (LIPITOR) 40 MG tablet TAKE 1 TABLET BY MOUTH DAILY FOR CHOLESTEROL 90 tablet 2   bisoprolol-hydrochlorothiazide (ZIAC) 5-6.25 MG tablet Take 1 tablet Daily for BP 90 tablet 3   Cholecalciferol (VITAMIN D-3 PO) Take 10,000 Units by mouth daily.      Cinnamon 500 MG capsule Take 500 mg by mouth 4 (four) times daily.     clindamycin (CLINDAGEL) 1 % gel Apply topically 2 (two) times daily. 60 g 3   fenofibrate (TRICOR) 145 MG tablet TAKE 1 TABLET BY MOUTH DAILY FOR TRIGLYCERIDES( BLOOD FATS) 90 tablet 2   fluticasone (FLONASE) 50 MCG/ACT nasal spray Use 1 to 2 sprays each Nostril 1 to 2 x /day 48 g 3   Multiple Vitamin (MULTIVITAMIN) tablet Take 1 tablet by mouth daily.     Omega-3 Fatty Acids (FISH OIL PO) Take 2,400 mg by mouth 2 (two) times daily.      triamcinolone ointment  (KENALOG) 0.1 % Apply 1 application topically 2 (two) times daily. (Patient taking differently: Apply 1 application topically as needed.) 80 g 1   zinc gluconate 50 MG tablet Take 50 mg by mouth daily.     No current facility-administered medications for this visit.    No Known Allergies  REVIEW OF SYSTEMS:   [X]  denotes positive finding, [ ]  denotes negative finding Cardiac  Comments:  Chest pain or chest pressure:    Shortness of breath upon exertion:    Short of breath when lying flat:    Irregular heart rhythm:        Vascular    Pain in calf, thigh, or hip brought on by ambulation:    Pain in feet at night that wakes you up from your sleep:     Blood clot in your veins:    Leg swelling:  x ankle      Pulmonary    Oxygen at home:    Productive cough:     Wheezing:         Neurologic    Sudden weakness in arms or legs:     Sudden numbness in arms or legs:     Sudden onset of difficulty speaking or slurred speech:    Temporary loss of vision in one eye:     Problems with dizziness:         Gastrointestinal    Blood in stool:     Vomited blood:         Genitourinary    Burning when urinating:     Blood in urine:        Psychiatric    Major depression:         Hematologic    Bleeding problems:    Problems with blood clotting too easily:        Skin    Rashes or ulcers:        Constitutional    Fever or chills:      PHYSICAL EXAMINATION:  Today's Vitals   04/26/21 1354  BP: (!) 156/83  Pulse: (!) 52  Resp: 20  Temp: 97.8 F (36.6 C)  TempSrc: Temporal  SpO2: 99%  Weight: 212 lb 14.4 oz (96.6 kg)  Height: 6' 1.5" (1.867 m)   Body mass index is 27.71 kg/m.   General:  WDWN in NAD; vital signs documented above Gait: Normal HENT: WNL, normocephalic Pulmonary: normal non-labored breathing without wheezing Cardiac: regular HR; without carotid bruits Abdomen: soft, NT, no masses; aortic pulse is not palpable Skin: without rashes Vascular  Exam/Pulses:  Right Left  Radial 2+ (normal) 2+ (normal)  DP 2+ (normal) 2+ (normal)  PT 2+ (normal) 2+ (normal)   Extremities:   Left leg      Neurologic: A&O X 3;  moving all extremities equally Psychiatric:  The pt has Normal affect.   Non-Invasive Vascular Imaging:   Venous duplex on 04/26/2021: +--------------+---------+------+-----------+------------+--------+  LEFT          Reflux NoRefluxReflux TimeDiameter cmsComments                          Yes                                   +--------------+---------+------+-----------+------------+--------+  CFV                     yes   >1 second                       +--------------+---------+------+-----------+------------+--------+  FV mid        no                                              +--------------+---------+------+-----------+------------+--------+  Popliteal     no                                              +--------------+---------+------+-----------+------------+--------+  GSV at SFJ              yes    >500 ms      0.77              +--------------+---------+------+-----------+------------+--------+  GSV prox thigh          yes    >500 ms      0.83              +--------------+---------+------+-----------+------------+--------+  GSV mid thigh           yes    >500 ms      0.63              +--------------+---------+------+-----------+------------+--------+  GSV dist thigh          yes    >500 ms      0.67              +--------------+---------+------+-----------+------------+--------+  GSV at knee             yes    >500 ms      0.45              +--------------+---------+------+-----------+------------+--------+  GSV prox calf no  0.64              +--------------+---------+------+-----------+------------+--------+  GSV mid calf            yes    >500 ms      0.33               +--------------+---------+------+-----------+------------+--------+  GSV dist calf no                            0.29              +--------------+---------+------+-----------+------------+--------+  SSV Pop Fossa no                            0.30              +--------------+---------+------+-----------+------------+--------+  SSV prox calf no                            0.23              +--------------+---------+------+-----------+------------+--------+  SSV mid calf            yes                 0.35              +--------------+---------+------+-----------+------------+--------+  AASV          no                            0.32              +--------------+---------+------+-----------+------------+--------+  AASV          no                            0.38              +--------------+---------+------+-----------+------------+--------+   Summary:  Left:  - No evidence of deep vein thrombosis seen in the left lower extremity,  from the common femoral through the popliteal veins.  - Venous reflux is noted in the left common femoral vein.  - Venous reflux is noted in the left sapheno-femoral junction.  - Venous reflux is noted in the left greater saphenous vein in the thigh.  - Venous reflux is noted in the left greater saphenous vein in the calf.  - Venous reflux is noted in the left short saphenous vein.    Tony Bailey is a 60 y.o. male who presents with: varicose veins in the left leg  -pt has easily palpable pedal pulses -pt does not have evidence of DVT.  Pt does have venous reflux at the GSV and SFJ throughout the GSV with it measuring 0.45-0.77cm down to the proximal calf.  He also has some venous reflux in the CFV. -discussed with pt about wearing thigh high 20-30 mmHg compression stockings  -he has had a bleeding episode from his spider veins in the left lower leg.  Discussed how to hold pressure should he have any further  bleeding.  Discussed with Dr. Oneida Alar about sclerotherapy at time of laser ablation if he is a candidate and depending on insurance if this is approved.  Discussed this with pt.  -discussed the importance of leg elevation and how to elevate properly - pt is advised to  elevate their legs and a diagram is given to them to demonstrate to lay flat on their back with knees elevated and slightly bent with their feet higher than her knees, which puts their feet higher than their heart for 15 minutes per day.  If they cannot lay flat, advised to lay as flat as possible.  -pt is advised to continue as much walking as possible and avoid sitting or standing for long periods of time.  -discussed importance of weight loss and exercise and that water aerobics would also be beneficial.  -handout with recommendations given -pt will f/u 3 months for further evaluation for laser ablation   Leontine Locket, [facility] 04/25/2021 3:37 PM  Clinic MD:  Oneida Alar

## 2021-04-26 ENCOUNTER — Ambulatory Visit (HOSPITAL_COMMUNITY)
Admission: RE | Admit: 2021-04-26 | Discharge: 2021-04-26 | Disposition: A | Discharge status: Home / Self Care | Payer: BC Managed Care – PPO | Source: Ambulatory Visit | Attending: Vascular Surgery | Admitting: Vascular Surgery

## 2021-04-26 ENCOUNTER — Ambulatory Visit: Payer: BC Managed Care – PPO | Admitting: Physician Assistant

## 2021-04-26 ENCOUNTER — Other Ambulatory Visit: Payer: Self-pay

## 2021-04-26 VITALS — BP 156/83 | HR 52 | Temp 97.8°F | Resp 20 | Ht 73.5 in | Wt 212.9 lb

## 2021-04-26 DIAGNOSIS — I8393 Asymptomatic varicose veins of bilateral lower extremities: Secondary | ICD-10-CM | POA: Insufficient documentation

## 2021-05-17 ENCOUNTER — Encounter: Payer: BC Managed Care – PPO | Admitting: Internal Medicine

## 2021-05-29 ENCOUNTER — Telehealth: Payer: Self-pay | Admitting: *Deleted

## 2021-05-29 NOTE — Telephone Encounter (Signed)
Following up with Tony Bailey and his wife regarding bleeding episode LLE, pre-cert process, and scheduling of VV office surgery.  Advised patient to avoid water hitting his leg while showering and being very cautious when drying off post shower. Advised him to cover area of bleeding varicosity at all times including when wearing compression hose.  Reviewed with patient and his wife how to stop bleeding from varicosity if it reoccurs.  Answered patient and his wife's questions regarding 3 months of conservative treatment required by insurance companies for varicose vein treatment, pre-certification and scheduling of varicose vein procedures. Patient and his wife verbalized understanding.  Patient has 3 month VV FU scheduled with Dr. Scot Dock on 07-18-2021 and will contact [facility] if bleeding episodes reoccur/continue.

## 2021-05-29 NOTE — Telephone Encounter (Signed)
Opened in error

## 2021-05-29 NOTE — Telephone Encounter (Signed)
Pts wife called concerned about pts varicose vein bleeding. She states pt was in the shower and they were able to get the bleeding under control. Gave pts wife instructions on how to hold pressure effectively. Pt is concerned about bleeding at night and not noticing. Advised pt to cover the vein with a bandage to protect it from friction at night. Pt has questions about having a vein procedure without waiting the 3 month conservative therapy period. I have passed pts information on to our Vein nurse to contact the patient about vein procedure questions. Pts wife verbalized understanding.

## 2021-06-07 DIAGNOSIS — I8393 Asymptomatic varicose veins of bilateral lower extremities: Secondary | ICD-10-CM

## 2021-06-21 ENCOUNTER — Encounter: Payer: Self-pay | Admitting: Vascular Surgery

## 2021-06-21 ENCOUNTER — Ambulatory Visit (INDEPENDENT_AMBULATORY_CARE_PROVIDER_SITE_OTHER): Payer: BC Managed Care – PPO | Admitting: Vascular Surgery

## 2021-06-21 ENCOUNTER — Other Ambulatory Visit: Payer: Self-pay

## 2021-06-21 VITALS — BP 150/80 | HR 58 | Temp 98.1°F | Resp 20 | Ht 73.5 in | Wt 210.0 lb

## 2021-06-21 DIAGNOSIS — I83812 Varicose veins of left lower extremities with pain: Secondary | ICD-10-CM

## 2021-06-21 DIAGNOSIS — I872 Venous insufficiency (chronic) (peripheral): Secondary | ICD-10-CM | POA: Diagnosis not present

## 2021-06-21 NOTE — Progress Notes (Signed)
REASON FOR VISIT:   Follow-up of chronic venous insufficiency  MEDICAL ISSUES:   CHRONIC VENOUS INSUFFICIENCY: This patient has CEAP C4c venous disease.  He has had 4 bleeding episodes from his left leg and I think is at high risk for recurrent bleeding episodes.  He has been wearing his thigh-high stockings, elevating his legs, and trying to avoid sitting and standing.  I think he would be a good candidate for laser ablation of the left great saphenous vein.  We would likely cannulate this in the distal thigh.  He would also require greater than 20 stab phlebectomies and 2 units of sclerotherapy for the areas of bleeding in the left foot.  I have discussed the indications for endovenous laser ablation of the left GSV, that is to lower the pressure in the veins and potentially help relieve the symptoms from venous hypertension.  I have also discussed alternative options such as conservative treatment as described above. I have discussed the potential complications of the procedure, including bleeding, bruising, leg swelling, deep venous thrombosis (<1% risk), or failure of the vein to close <1% risk).  I have also explained that venous insufficiency is a chronic disease, and that the patient is at risk for recurrent varicose veins in the future.  All of the patient's questions were encouraged and answered. They are agreeable to proceed.   I have discussed with the patient the indications for stab phlebectomy.  I have explained to the patient that that will have small scars from the stab incisions.  I explained that the other risks include bruising, bleeding, and phlebitis.   We will schedule this in the near future.   HPI:   Tony Bailey is a pleasant 60 y.o. male who was seen by Liana Crocker, PA on 04/26/2021 with varicose veins of the left lower extremity.  These have gotten bigger over the last 6 to 12 months.  He was complaining of throbbing pain over the veins.  He was wearing  compression stockings but still having significant symptoms.  He also had spider veins around his ankles bilaterally.  He had a bleeding episode from 1 of these areas recently.  He denies any previous history of DVT.  On exam he had some dilated veins in the medial left thigh and proximal left leg.  He had palpable pedal pulses.  He was encouraged to wear thigh-high compression stockings with a gradient of 20 to 30 mmHg.  He was also encouraged to elevate his legs.  He was to come back in 3 months.  Since he was seen in the office he had 3 bleeding episode in August from the spider veins in his left foot and left anterior leg.  He has been wearing his thigh-high compression stockings.  He has been elevating his legs 3 times a day.  He does exercise.  He tries to avoid prolonged sitting and standing.  Past Medical History:  Diagnosis Date   Anxiety    Hyperlipidemia    Hypertension    Kidney stones    passed stones   OSA (obstructive sleep apnea)    Other testicular hypofunction    Prediabetes 05/09/2019   Sciatic nerve pain 2010   hx - no current problems per patient 07/06/20   Sleep apnea    uses cpap nightly   Unspecified vitamin D deficiency     Family History  Problem Relation Age of Onset   Aneurysm Mother    Kidney disease Father  Kidney failure Father    Prostate cancer Father    Colon cancer Neg Hx    Stomach cancer Neg Hx    Rectal cancer Neg Hx     SOCIAL HISTORY: Social History   Tobacco Use   Smoking status: Never   Smokeless tobacco: Never  Substance Use Topics   Alcohol use: No    No Known Allergies  Current Outpatient Medications  Medication Sig Dispense Refill   aspirin 81 MG tablet Take 81 mg by mouth daily.     atorvastatin (LIPITOR) 40 MG tablet TAKE 1 TABLET BY MOUTH DAILY FOR CHOLESTEROL 90 tablet 2   bisoprolol-hydrochlorothiazide (ZIAC) 5-6.25 MG tablet Take 1 tablet Daily for BP 90 tablet 3   Cholecalciferol (VITAMIN D-3 PO) Take 10,000 Units  by mouth daily.      Cinnamon 500 MG capsule Take 500 mg by mouth 4 (four) times daily.     clindamycin (CLINDAGEL) 1 % gel Apply topically 2 (two) times daily. 60 g 3   fenofibrate (TRICOR) 145 MG tablet TAKE 1 TABLET BY MOUTH DAILY FOR TRIGLYCERIDES( BLOOD FATS) 90 tablet 2   fluticasone (FLONASE) 50 MCG/ACT nasal spray Use 1 to 2 sprays each Nostril 1 to 2 x /day 48 g 3   Multiple Vitamin (MULTIVITAMIN) tablet Take 1 tablet by mouth daily.     Omega-3 Fatty Acids (FISH OIL PO) Take 2,400 mg by mouth 2 (two) times daily.      triamcinolone ointment (KENALOG) 0.1 % Apply 1 application topically 2 (two) times daily. (Patient taking differently: Apply 1 application topically as needed.) 80 g 1   zinc gluconate 50 MG tablet Take 50 mg by mouth daily.     No current facility-administered medications for this visit.    REVIEW OF SYSTEMS:  '[X]'$  denotes positive finding, '[ ]'$  denotes negative finding Cardiac  Comments:  Chest pain or chest pressure:    Shortness of breath upon exertion:    Short of breath when lying flat:    Irregular heart rhythm:        Vascular    Pain in calf, thigh, or hip brought on by ambulation:    Pain in feet at night that wakes you up from your sleep:     Blood clot in your veins:    Leg swelling:         Pulmonary    Oxygen at home:    Productive cough:     Wheezing:         Neurologic    Sudden weakness in arms or legs:     Sudden numbness in arms or legs:     Sudden onset of difficulty speaking or slurred speech:    Temporary loss of vision in one eye:     Problems with dizziness:         Gastrointestinal    Blood in stool:     Vomited blood:         Genitourinary    Burning when urinating:     Blood in urine:        Psychiatric    Major depression:         Hematologic    Bleeding problems:    Problems with blood clotting too easily:        Skin    Rashes or ulcers:        Constitutional    Fever or chills:     PHYSICAL EXAM:    Vitals:  06/21/21 1340  BP: (!) 150/80  Pulse: (!) 58  Resp: 20  Temp: 98.1 F (36.7 C)  SpO2: 96%  Weight: 210 lb (95.3 kg)  Height: 6' 1.5" (1.867 m)    GENERAL: The patient is a well-nourished male, in no acute distress. The vital signs are documented above. CARDIAC: There is a regular rate and rhythm.  VASCULAR: I do not detect carotid bruits. He has palpable pedal pulses bilaterally. He has markedly dilated varicose veins in the left medial thigh and leg as documented in the photograph below.   He has Physiological scientist.       PULMONARY: There is good air exchange bilaterally without wheezing or rales. ABDOMEN: Soft and non-tender with normal pitched bowel sounds.  MUSCULOSKELETAL: There are no major deformities or cyanosis. NEUROLOGIC: No focal weakness or paresthesias are detected. SKIN: There are no ulcers or rashes noted. PSYCHIATRIC: The patient has a normal affect.  DATA:    VENOUS DUPLEX: I have reviewed the venous duplex scan that was done on 04/26/2021.  This was of the left lower extremity only.  He has no evidence of DVT.  There is deep venous reflux involving the common femoral vein.  There is superficial venous reflux from the saphenofemoral junction down to the proximal calf.  Diameters of the vein ranged from 6.3 to 8.3 mm.     Deitra Mayo [facility] [phone]

## 2021-06-26 NOTE — Progress Notes (Addendum)
Complete Physical  Assessment and Plan:  Tony Bailey was seen today for annual exam.  Diagnoses and all orders for this visit:  Encounter for routine adult health examination with abnormal findings Due annually  Health Maintenance reviewed Healthy lifestyle reviewed and goals set  Essential hypertension Continue medication Monitor blood pressure at home; call if consistently over 130/80 Continue DASH diet.   Reminder to go to the ER if any CP, SOB, nausea, dizziness, severe HA, changes vision/speech, left arm numbness and tingling and jaw pain. -     CBC with Differential/Platelet -     COMPLETE METABOLIC PANEL WITH GFR -     Magnesium -     TSH -     Microalbumin / creatinine urine ratio -     Urinalysis, Routine w reflex microscopic -     EKG 12-Lead  OSA on CPAP Weight loss encouraged, continue CPAP  Abnormal glucose (prediabetes) Discussed disease and risks Discussed diet/exercise, weight management  Check A1C q6m CMP for glucose otherwise -     COMPLETE METABOLIC PANEL WITH GFR -     Hemoglobin A1c  Hyperlipidemia, mixed Continue medications LDL goal <100 Continue low cholesterol diet and exercise.  Check lipid panel.  -     Lipid panel -     TSH  Vitamin D deficiency -     VITAMIN D 25 Hydroxy (Vit-D Deficiency, Fractures)  Medication management -     CBC with Differential/Platelet -     COMPLETE METABOLIC PANEL WITH GFR -     Magnesium  History of adenomatous polyp of colon Colonoscopy UTD; next due 2028; high fiber diet encouraged  BPH with obstruction/lower urinary tract symptoms Minimal nocturia; monitor -     PSA  Overweight (BMI 25.0-29.9) Long discussion about weight loss, diet, and exercise Recommended diet heavy in fruits and veggies and low in animal meats, cheeses, and dairy products, appropriate calorie intake Discussed appropriate weight for height  Follow up at next visit  Screening for prostate cancer -     PSA  Screening for thyroid  disorder -     TSH  Screening for diabetes mellitus -     Hemoglobin A1c  Screening for hematuria or proteinuria Intermittent brown urine - low threshold for hematuria workup -     Microalbumin / creatinine urine ratio -     Urinalysis, Routine w reflex microscopic  Screening for cardiovascular condition -     EKG 12-Lead  Family history of abdominal aortic aneurysm (AAA) Screening for AAA (abdominal aortic aneurysm) In father and paternal aunt Patient preference annual screening  -     [place] RETROPERITNL ABD,  LTD   Discussed med's effects and SE's. Screening labs and tests as requested with regular follow-up as recommended. Over 40 minutes of exam, counseling, chart review and critical decision making was performed  Future Appointments  Date Time Provider [place] 06/27/2022  9:00 AM CLiane Comber NP [facility] None    HPI 60y.o. male patient presents for a complete physical. He has Essential hypertension; Hyperlipidemia, mixed; Vitamin D deficiency; OSA on CPAP; Abnormal glucose (prediabetes); Medication management; Overweight (BMI 25.0-29.9); BPH with obstruction/lower urinary tract symptoms; History of adenomatous polyp of colon; Varicose veins of left lower extremity; and Family history of abdominal aortic aneurysm (AAA) on their problem list.   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 He is mostly transitioned back to work, flexible. He is married, no  children.   He has OSA and is on CPAP and reports 100% compliance, reports restorative sleep, still using machine from 2006. He would like to follow up at some point, but declines for now in light of pandemic. [facility] does provide new tubing periodically.   He has left varicose veins with bleeding complications this past year, following with Dr. Scot Dock and planning ablation later this year, 08/08/2021 or sooner.   He has hx of melanoma in 2009, [facility]  [facility]   Hx of kidney stones, none recently. Reports brief intermittent episodes of "dark" brown urine, for a few months, will have 1 episode of dark urine then resolve quickly. Reports had similar again this AM. Denies flank pain.   BMI is Body mass index is 27.23 kg/m., he has been working on diet and exercise, has been working on walking and watching portions, less sweets.  Wt Readings from Last 3 Encounters:  06/27/21 209 lb 3.2 oz (94.9 kg)  06/21/21 210 lb (95.3 kg)  04/26/21 212 lb 14.4 oz (96.6 kg)   His blood pressure has been controlled at home, today their BP is BP: 124/80 He does workout. He denies chest pain, shortness of breath, dizziness.   He is on cholesterol medication (atorvastatin, 40 mg, fenofibrate, omega 3) and denies myalgias. His cholesterol is not at goal. The cholesterol last visit was:   Lab Results  Component Value Date   CHOL 166 03/26/2021   HDL 49 03/26/2021   LDLCALC 86 03/26/2021   TRIG 226 (H) 03/26/2021   CHOLHDL 3.4 03/26/2021   He has been working on diet and exercise for prediabetes, he is on bASA, nausea, paresthesia of the feet, polydipsia, and polyuria. Last A1C in the office was:  Lab Results  Component Value Date   HGBA1C 5.8 (H) 11/23/2020   Last GFR: Lab Results  Component Value Date   GFRNONAA 61 03/26/2021   GFRNONAA 66 11/23/2020   GFRNONAA 74 08/23/2020   Patient is on Vitamin D supplement, taking 10000 IU daily.   Lab Results  Component Value Date   VD25OH 53 11/23/2020     Denies LUTs. Did have hx of prostate cancer in father in 50s. Last PSA was: Lab Results  Component Value Date   PSA 1.7 05/17/2020      Current Medications:  Current Outpatient Medications on File Prior to Visit  Medication Sig Dispense Refill   aspirin 81 MG tablet Take 81 mg by mouth daily.     atorvastatin (LIPITOR) 40 MG tablet TAKE 1 TABLET BY MOUTH DAILY FOR CHOLESTEROL 90 tablet 2    bisoprolol-hydrochlorothiazide (ZIAC) 5-6.25 MG tablet Take 1 tablet Daily for BP 90 tablet 3   Cholecalciferol (VITAMIN D-3 PO) Take 10,000 Units by mouth daily.      Cinnamon 500 MG capsule Take 100 mg by mouth 2 (two) times daily.     clindamycin (CLINDAGEL) 1 % gel Apply topically 2 (two) times daily. 60 g 3   fenofibrate (TRICOR) 145 MG tablet TAKE 1 TABLET BY MOUTH DAILY FOR TRIGLYCERIDES( BLOOD FATS) 90 tablet 2   fluticasone (FLONASE) 50 MCG/ACT nasal spray Use 1 to 2 sprays each Nostril 1 to 2 x /day 48 g 3   Multiple Vitamin (MULTIVITAMIN) tablet Take 1 tablet by mouth daily.     Omega-3 Fatty Acids (FISH OIL PO) Take 2,400 mg by mouth 2 (two) times daily.      triamcinolone ointment (KENALOG) 0.1 %  Apply 1 application topically 2 (two) times daily. (Patient taking differently: Apply 1 application topically as needed.) 80 g 1   zinc gluconate 50 MG tablet Take 50 mg by mouth daily.     No current facility-administered medications on file prior to visit.   Allergies:  No Known Allergies Health Maintenance:  Immunization History  Administered Date(s) Administered   Influenza Inj Mdck Quad Pf 08/10/2020   Influenza Inj Mdck Quad With Preservative 09/03/2017, 07/28/2018   Influenza Split 08/23/2014   Influenza Whole 08/09/2013   Influenza, Seasonal, Injecte, Preservative Fre 07/30/2016   Influenza-Unspecified 08/01/2019, 08/10/2020   PFIZER(Purple Top)SARS-COV-2 Vaccination 12/30/2019, 01/20/2020, 07/31/2020, 03/30/2021   PPD Test 08/23/2014, 10/18/2015, 01/29/2017, 04/09/2018, 05/10/2019, 05/17/2020   Pneumococcal Conjugate-13 08/23/2014   Tdap 01/17/2016   Zoster Recombinat (Shingrix) 09/12/2019, 11/20/2019    Tetanus: 01/2016 Pneumovax: get age 68 Prevnar 26: 2015 Flu vaccine: 07/2020 - get in Oct Shingrix: 2/2 Covid 19: 2/2 pfizer + booster last 03/30/2021  Colonoscopy: 07/27/2020, Dr. Havery Moros, adenomatous polyp, 7 year recall  EGD:  Eye Exam: Dr. Austin Miles, [facility], glasses, last 2021, has upcoming  Dentist: Dr. Rodman Comp, last 2022, goes q53m Patient Care Team: MUnk Pinto MD as PCP - General (Internal Medicine)  Medical History:  has Essential hypertension; Hyperlipidemia, mixed; Vitamin D deficiency; OSA on CPAP; Abnormal glucose (prediabetes); Medication management; Overweight (BMI 25.0-29.9); BPH with obstruction/lower urinary tract symptoms; History of adenomatous polyp of colon; Varicose veins of left lower extremity; and Family history of abdominal aortic aneurysm (AAA) on their problem list. Surgical History:  He  has a past surgical history that includes Wisdom tooth extraction (1976); Nasal septum surgery; Incision and drainage perirectal abscess; Melanoma excision (2009); Hernia repair (Left, groin); Hernia repair (Right, groin); Shoulder arthroscopy (Left, 2003); melanona (2009); and Colonoscopy (04/13/2010). Family History:  His family history includes AAA (abdominal aortic aneurysm) in his father and paternal aunt; Aneurysm in his mother; CAD in his father and paternal aunt; Heart attack in his paternal uncle; Kidney disease in his father; Kidney failure in his father; Prostate cancer in his father. Social History:   reports that he has never smoked. He has never used smokeless tobacco. He reports that he does not drink alcohol and does not use drugs.  Review of Systems:  Review of Systems  Constitutional:  Negative for malaise/fatigue and weight loss.  HENT:  Negative for hearing loss and tinnitus.   Eyes:  Negative for blurred vision and double vision.  Respiratory:  Negative for cough, sputum production, shortness of breath and wheezing.   Cardiovascular:  Negative for chest pain, palpitations, orthopnea, claudication, leg swelling and PND.  Gastrointestinal:  Negative for abdominal pain, blood in stool, constipation, diarrhea, heartburn, melena, nausea and vomiting.  Genitourinary: Negative.   Musculoskeletal:   Negative for falls, joint pain and myalgias.  Skin:  Negative for rash.  Neurological:  Negative for dizziness, tingling, sensory change, weakness and headaches.  Endo/Heme/Allergies:  Negative for polydipsia.  Psychiatric/Behavioral: Negative.  Negative for depression, memory loss, substance abuse and suicidal ideas. The patient is not nervous/anxious and does not have insomnia.   All other systems reviewed and are negative.  Physical Exam: Estimated body mass index is 27.23 kg/m as calculated from the following:   Height as of this encounter: 6' 1.5" (1.867 m).   Weight as of this encounter: 209 lb 3.2 oz (94.9 kg). BP 124/80   Pulse 80   Temp (!) 97.3 F (36.3 C)   Ht  6' 1.5" (1.867 m)   Wt 209 lb 3.2 oz (94.9 kg)   SpO2 99%   BMI 27.23 kg/m  General Appearance: Well nourished, in no apparent distress.  Eyes: PERRLA, EOMs, conjunctiva no swelling or erythema Sinuses: No Frontal/maxillary tenderness  ENT/Mouth: Ext aud canals clear, normal light reflex with TMs without erythema, bulging. Good dentition. No erythema, swelling, or exudate on post pharynx. Tonsils not swollen or erythematous. Hearing normal.  Neck: Supple, thyroid normal. No bruits  Respiratory: Respiratory effort normal, BS equal bilaterally without rales, rhonchi, wheezing or stridor.  Cardio: RRR without murmurs, rubs or gallops. Brisk peripheral pulses without edema. Extensive varicose veins and spider veins bil lower extremities.  Chest: symmetric, with normal excursions and percussion.  Abdomen: Soft, nontender, no guarding, rebound, masses, or organomegaly. Ventral hernia with bearing down.  Lymphatics: Non tender without lymphadenopathy.  Genitourinary: no concerns, defer Musculoskeletal: Full ROM all peripheral extremities,5/5 strength, and normal gait.  Skin: Warm, dry without rashes, lesions, ecchymosis. Neuro: Cranial nerves intact, reflexes equal bilaterally. Normal muscle tone, no cerebellar symptoms.  Sensation intact.  Psych: Awake and oriented X 3, normal affect, Insight and Judgment appropriate.   EKG: WNL no changes.  AAA [place]: <3 cm   Gorden Harms Anisha Starliper 9:40 AM [facility]

## 2021-06-27 ENCOUNTER — Ambulatory Visit (HOSPITAL_BASED_OUTPATIENT_CLINIC_OR_DEPARTMENT_OTHER)
Admission: RE | Admit: 2021-06-27 | Discharge: 2021-06-27 | Disposition: A | Discharge status: Home / Self Care | Payer: BC Managed Care – PPO | Source: Ambulatory Visit | Attending: Adult Health | Admitting: Adult Health

## 2021-06-27 ENCOUNTER — Other Ambulatory Visit: Payer: Self-pay | Admitting: Adult Health

## 2021-06-27 ENCOUNTER — Other Ambulatory Visit: Payer: Self-pay | Admitting: *Deleted

## 2021-06-27 ENCOUNTER — Encounter: Payer: Self-pay | Admitting: Adult Health

## 2021-06-27 ENCOUNTER — Other Ambulatory Visit: Payer: Self-pay

## 2021-06-27 ENCOUNTER — Ambulatory Visit (INDEPENDENT_AMBULATORY_CARE_PROVIDER_SITE_OTHER): Payer: BC Managed Care – PPO | Admitting: Adult Health

## 2021-06-27 VITALS — BP 124/80 | HR 80 | Temp 97.3°F | Ht 73.5 in | Wt 209.2 lb

## 2021-06-27 DIAGNOSIS — R310 Gross hematuria: Secondary | ICD-10-CM

## 2021-06-27 DIAGNOSIS — E559 Vitamin D deficiency, unspecified: Secondary | ICD-10-CM | POA: Diagnosis not present

## 2021-06-27 DIAGNOSIS — E349 Endocrine disorder, unspecified: Secondary | ICD-10-CM

## 2021-06-27 DIAGNOSIS — N401 Enlarged prostate with lower urinary tract symptoms: Secondary | ICD-10-CM

## 2021-06-27 DIAGNOSIS — Z1329 Encounter for screening for other suspected endocrine disorder: Secondary | ICD-10-CM

## 2021-06-27 DIAGNOSIS — E782 Mixed hyperlipidemia: Secondary | ICD-10-CM

## 2021-06-27 DIAGNOSIS — R319 Hematuria, unspecified: Secondary | ICD-10-CM

## 2021-06-27 DIAGNOSIS — R1032 Left lower quadrant pain: Secondary | ICD-10-CM

## 2021-06-27 DIAGNOSIS — I700 Atherosclerosis of aorta: Secondary | ICD-10-CM | POA: Insufficient documentation

## 2021-06-27 DIAGNOSIS — Z136 Encounter for screening for cardiovascular disorders: Secondary | ICD-10-CM

## 2021-06-27 DIAGNOSIS — Z13220 Encounter for screening for lipoid disorders: Secondary | ICD-10-CM | POA: Diagnosis not present

## 2021-06-27 DIAGNOSIS — Z79899 Other long term (current) drug therapy: Secondary | ICD-10-CM | POA: Diagnosis not present

## 2021-06-27 DIAGNOSIS — Z0001 Encounter for general adult medical examination with abnormal findings: Secondary | ICD-10-CM

## 2021-06-27 DIAGNOSIS — R82998 Other abnormal findings in urine: Secondary | ICD-10-CM

## 2021-06-27 DIAGNOSIS — I83812 Varicose veins of left lower extremities with pain: Secondary | ICD-10-CM

## 2021-06-27 DIAGNOSIS — R109 Unspecified abdominal pain: Secondary | ICD-10-CM

## 2021-06-27 DIAGNOSIS — R350 Frequency of micturition: Secondary | ICD-10-CM | POA: Diagnosis not present

## 2021-06-27 DIAGNOSIS — Z1389 Encounter for screening for other disorder: Secondary | ICD-10-CM

## 2021-06-27 DIAGNOSIS — Z86010 Personal history of colonic polyps: Secondary | ICD-10-CM

## 2021-06-27 DIAGNOSIS — K760 Fatty (change of) liver, not elsewhere classified: Secondary | ICD-10-CM | POA: Insufficient documentation

## 2021-06-27 DIAGNOSIS — R7309 Other abnormal glucose: Secondary | ICD-10-CM

## 2021-06-27 DIAGNOSIS — Z131 Encounter for screening for diabetes mellitus: Secondary | ICD-10-CM

## 2021-06-27 DIAGNOSIS — N138 Other obstructive and reflux uropathy: Secondary | ICD-10-CM

## 2021-06-27 DIAGNOSIS — I10 Essential (primary) hypertension: Secondary | ICD-10-CM

## 2021-06-27 DIAGNOSIS — G4733 Obstructive sleep apnea (adult) (pediatric): Secondary | ICD-10-CM

## 2021-06-27 DIAGNOSIS — Z125 Encounter for screening for malignant neoplasm of prostate: Secondary | ICD-10-CM

## 2021-06-27 DIAGNOSIS — E663 Overweight: Secondary | ICD-10-CM

## 2021-06-27 DIAGNOSIS — Z0000 Encounter for general adult medical examination without abnormal findings: Secondary | ICD-10-CM | POA: Diagnosis not present

## 2021-06-27 DIAGNOSIS — Z8249 Family history of ischemic heart disease and other diseases of the circulatory system: Secondary | ICD-10-CM | POA: Insufficient documentation

## 2021-06-27 DIAGNOSIS — Z87442 Personal history of urinary calculi: Secondary | ICD-10-CM

## 2021-06-27 MED ORDER — TAMSULOSIN HCL 0.4 MG PO CAPS: ORAL_CAPSULE | ORAL | 0 refills | Status: DC

## 2021-06-27 NOTE — Patient Instructions (Signed)
[name] , Thank you for taking time to come for your Annual Wellness Visit. I appreciate your ongoing commitment to your health goals. Please review the following plan we discussed and let me know if I can assist you in the future.   These are the goals we discussed:  Goals      Blood Pressure < 130/80     DIET - INCREASE WATER INTAKE     Cut down on diet soda     Exercise 150 min/wk Moderate Activity     HEMOGLOBIN A1C < 5.7     Weight (lb) < 205 lb (93 kg)        This is a list of the screening recommended for you and due dates:  Health Maintenance  Topic Date Due   Flu Shot  07/14/2021*   Pneumococcal Vaccination (2 - PPSV23 or PCV20) 06/27/2024*   COVID-19 Vaccine (5 - Booster for Pfizer series) 07/30/2021   Tetanus Vaccine  01/16/2026   Colon Cancer Screening  07/28/2027   Hepatitis C Screening: USPSTF Recommendation to screen - Ages 18-79 yo.  Completed   Zoster (Shingles) Vaccine  Completed   HPV Vaccine  Aged Out   HIV Screening  Discontinued  *Topic was postponed. The date shown is not the original due date.     Know what a healthy weight is for you (roughly BMI <25) and aim to maintain this  Aim for 7+ servings of fruits and vegetables daily  65-80+ fluid ounces of water or unsweet tea for healthy kidneys  Limit to max 1 drink of alcohol per day; avoid smoking/tobacco  Limit animal fats in diet for cholesterol and heart health - choose grass fed whenever available  Avoid highly processed foods, and foods high in saturated/trans fats  Aim for low stress - take time to unwind and care for your mental health  Aim for 150 min of moderate intensity exercise weekly for heart health, and weights twice weekly for bone health  Aim for 7-9 hours of sleep daily    High-Fiber Eating Plan Fiber, also called dietary fiber, is a type of carbohydrate. It is found foods such as fruits, vegetables, whole grains, and beans. A high-fiber diet can have many health  benefits. Your health care provider may recommend a high-fiber diet to help: Prevent constipation. Fiber can make your bowel movements more regular. Lower your cholesterol. Relieve the following conditions: Inflammation of veins in the anus (hemorrhoids). Inflammation of specific areas of the digestive tract (uncomplicated diverticulosis). A problem of the large intestine, also called the colon, that sometimes causes pain and diarrhea (irritable bowel syndrome, or IBS). Prevent overeating as part of a weight-loss plan. Prevent heart disease, type 2 diabetes, and certain cancers. What are tips for following this plan? Reading food labels  Check the nutrition facts label on food products for the amount of dietary fiber. Choose foods that have 5 grams of fiber or more per serving. The goals for recommended daily fiber intake include: Men (age 46 or younger): 34-38 g. Men (over age 32): 28-34 g. Women (age 72 or younger): 25-28 g. Women (over age 41): 22-25 g. Your daily fiber goal is _____________ g. Shopping Choose whole fruits and vegetables instead of processed forms, such as apple juice or applesauce. Choose a wide variety of high-fiber foods such as avocados, lentils, oats, and kidney beans. Read the nutrition facts label of the foods you choose. Be aware of foods with added fiber. These foods often have high  sugar and sodium amounts per serving. Cooking Use whole-grain flour for baking and cooking. Cook with brown rice instead of white rice. Meal planning Start the day with a breakfast that is high in fiber, such as a cereal that contains 5 g of fiber or more per serving. Eat breads and cereals that are made with whole-grain flour instead of refined flour or white flour. Eat brown rice, bulgur wheat, or millet instead of white rice. Use beans in place of meat in soups, salads, and pasta dishes. Be sure that half of the grains you eat each day are whole grains. General  information You can get the recommended daily intake of dietary fiber by: Eating a variety of fruits, vegetables, grains, nuts, and beans. Taking a fiber supplement if you are not able to take in enough fiber in your diet. It is better to get fiber through food than from a supplement. Gradually increase how much fiber you consume. If you increase your intake of dietary fiber too quickly, you may have bloating, cramping, or gas. Drink plenty of water to help you digest fiber. Choose high-fiber snacks, such as berries, raw vegetables, nuts, and popcorn. What foods should I eat? Fruits Berries. Pears. Apples. Oranges. Avocado. Prunes and raisins. Dried figs. Vegetables Sweet potatoes. Spinach. Kale. Artichokes. Cabbage. Broccoli. Cauliflower. Green peas. Carrots. Squash. Grains Whole-grain breads. Multigrain cereal. Oats and oatmeal. Brown rice. Barley. Bulgur wheat. Luce. Quinoa. Bran muffins. Popcorn. Rye wafer crackers. Meats and other proteins Navy beans, kidney beans, and pinto beans. Soybeans. Split peas. Lentils. Nuts and seeds. Dairy Fiber-fortified yogurt. Beverages Fiber-fortified soy milk. Fiber-fortified orange juice. Other foods Fiber bars. The items listed above may not be a complete list of recommended foods and beverages. Contact a dietitian for more information. What foods should I avoid? Fruits Fruit juice. Cooked, strained fruit. Vegetables Fried potatoes. Canned vegetables. Well-cooked vegetables. Grains White bread. Pasta made with refined flour. White rice. Meats and other proteins Fatty cuts of meat. Fried chicken or fried fish. Dairy Milk. Yogurt. Cream cheese. Sour cream. Fats and oils Butters. Beverages Soft drinks. Other foods Cakes and pastries. The items listed above may not be a complete list of foods and beverages to avoid. Talk with your dietitian about what choices are best for you. Summary Fiber is a type of carbohydrate. It is found in foods  such as fruits, vegetables, whole grains, and beans. A high-fiber diet has many benefits. It can help to prevent constipation, lower blood cholesterol, aid weight loss, and reduce your risk of heart disease, diabetes, and certain cancers. Increase your intake of fiber gradually. Increasing fiber too quickly may cause cramping, bloating, and gas. Drink plenty of water while you increase the amount of fiber you consume. The best sources of fiber include whole fruits and vegetables, whole grains, nuts, seeds, and beans. This information is not intended to replace advice given to you by your health care provider. Make sure you discuss any questions you have with your health care provider. Document Revised: 02/03/2020 Document Reviewed: 02/03/2020 Elsevier Patient Education  2022 [employer].

## 2021-06-28 ENCOUNTER — Encounter: Payer: Self-pay | Admitting: Adult Health

## 2021-06-28 DIAGNOSIS — K5730 Diverticulosis of large intestine without perforation or abscess without bleeding: Secondary | ICD-10-CM | POA: Insufficient documentation

## 2021-06-28 LAB — LIPID PANEL
Cholesterol: 169 mg/dL (ref <200)
HDL: 51 mg/dL (ref >=40)
LDL Cholesterol (Calc): 95 mg/dL (calc)
Non-HDL Cholesterol (Calc): 118 mg/dL (calc) (ref <130)
Total CHOL/HDL Ratio: 3.3 (calc) (ref <5.0)
Triglycerides: 126 mg/dL (ref <150)

## 2021-06-28 LAB — MICROSCOPIC MESSAGE

## 2021-06-28 LAB — COMPLETE METABOLIC PANEL WITH GFR
AG Ratio: 2 (calc) (ref 1.0–2.5)
ALT: 22 U/L (ref 9–46)
AST: 22 U/L (ref 10–35)
Albumin: 4.8 g/dL (ref 3.6–5.1)
Alkaline phosphatase (APISO): 43 U/L (ref 35–144)
BUN: 25 mg/dL (ref 7–25)
CO2: 31 mmol/L (ref 20–32)
Calcium: 10 mg/dL (ref 8.6–10.3)
Chloride: 103 mmol/L (ref 98–110)
Creat: 1.09 mg/dL (ref 0.70–1.35)
Globulin: 2.4 g/dL (calc) (ref 1.9–3.7)
Glucose, Bld: 109 mg/dL — ABNORMAL HIGH (ref 65–99)
Potassium: 4.7 mmol/L (ref 3.5–5.3)
Sodium: 140 mmol/L (ref 135–146)
Total Bilirubin: 0.7 mg/dL (ref 0.2–1.2)
Total Protein: 7.2 g/dL (ref 6.1–8.1)
eGFR: 77 mL/min/{1.73_m2} (ref >=60)

## 2021-06-28 LAB — CBC WITH DIFFERENTIAL/PLATELET
Absolute Monocytes: 470 cells/uL (ref 200–950)
Basophils Absolute: 81 cells/uL (ref 0–200)
Basophils Relative: 1.5 %
Eosinophils Absolute: 92 cells/uL (ref 15–500)
Eosinophils Relative: 1.7 %
HCT: 45.7 % (ref 38.5–50.0)
Hemoglobin: 15.3 g/dL (ref 13.2–17.1)
Lymphs Abs: 1415 cells/uL (ref 850–3900)
MCH: 30.5 pg (ref 27.0–33.0)
MCHC: 33.5 g/dL (ref 32.0–36.0)
MCV: 91.2 fL (ref 80.0–100.0)
MPV: 9.7 fL (ref 7.5–12.5)
Monocytes Relative: 8.7 %
Neutro Abs: 3343 cells/uL (ref 1500–7800)
Neutrophils Relative %: 61.9 %
Platelets: 342 10*3/uL (ref 140–400)
RBC: 5.01 10*6/uL (ref 4.20–5.80)
RDW: 11.5 % (ref 11.0–15.0)
Total Lymphocyte: 26.2 %
WBC: 5.4 10*3/uL (ref 3.8–10.8)

## 2021-06-28 LAB — URINALYSIS, ROUTINE W REFLEX MICROSCOPIC
Bilirubin Urine: NEGATIVE
Glucose, UA: NEGATIVE
Hyaline Cast: NONE SEEN /LPF
Ketones, ur: NEGATIVE
Nitrite: NEGATIVE
RBC / HPF: >=60 /HPF — AB (ref 0–2)
Specific Gravity, Urine: 1.026 (ref 1.001–1.035)
pH: <=5 (ref 5.0–8.0)

## 2021-06-28 LAB — MICROALBUMIN / CREATININE URINE RATIO
Creatinine, Urine: 201 mg/dL (ref 20–320)
Microalb Creat Ratio: 109 mcg/mg creat — ABNORMAL HIGH (ref <30)
Microalb, Ur: 22 mg/dL

## 2021-06-28 LAB — TSH: TSH: 2.39 mIU/L (ref 0.40–4.50)

## 2021-06-28 LAB — MAGNESIUM: Magnesium: 2.1 mg/dL (ref 1.5–2.5)

## 2021-06-28 LAB — HEMOGLOBIN A1C
Hgb A1c MFr Bld: 5.7 % of total Hgb — ABNORMAL HIGH (ref <5.7)
Mean Plasma Glucose: 117 mg/dL
eAG (mmol/L): 6.5 mmol/L

## 2021-06-28 LAB — PSA: PSA: 1.35 ng/mL (ref <=4.00)

## 2021-06-28 LAB — VITAMIN D 25 HYDROXY (VIT D DEFICIENCY, FRACTURES): Vit D, 25-Hydroxy: 64 ng/mL (ref 30–100)

## 2021-07-05 ENCOUNTER — Other Ambulatory Visit: Payer: Self-pay | Admitting: Urology

## 2021-07-06 ENCOUNTER — Other Ambulatory Visit: Payer: Self-pay

## 2021-07-06 ENCOUNTER — Encounter (HOSPITAL_BASED_OUTPATIENT_CLINIC_OR_DEPARTMENT_OTHER): Payer: Self-pay | Admitting: Urology

## 2021-07-06 NOTE — Progress Notes (Addendum)
Spoke w/ via phone for pre-op interview---patient Lab needs dos----Istat (patient receiving Gentamycin), EKG               Lab results------06/27/21 CBC & CMP in Epic COVID test -----patient states asymptomatic no test needed Arrive at -------1215 NPO after MN NO Solid Food.  Clear liquids from MN until---1115 Med rec completed Medications to take morning of surgery -----Tricor, Flomax Diabetic medication -----n/a Patient instructed no nail polish to be worn day of surgery Patient instructed to bring photo id and insurance card day of surgery Patient aware to have Driver (ride ) / caregiver    for 24 hours after surgery - wife Tony Bailey Patient Special Instructions -----bring CPAP & leave in car if needed Pre-Op special Istructions ----- Patient verbalized understanding of instructions that were given at this phone interview. Patient denies shortness of breath, chest pain, fever, cough at this phone interview.

## 2021-07-10 ENCOUNTER — Encounter (HOSPITAL_BASED_OUTPATIENT_CLINIC_OR_DEPARTMENT_OTHER): Payer: Self-pay | Admitting: Urology

## 2021-07-13 ENCOUNTER — Ambulatory Visit (HOSPITAL_BASED_OUTPATIENT_CLINIC_OR_DEPARTMENT_OTHER): Payer: BC Managed Care – PPO | Admitting: Anesthesiology

## 2021-07-13 ENCOUNTER — Encounter (HOSPITAL_BASED_OUTPATIENT_CLINIC_OR_DEPARTMENT_OTHER)
Admission: RE | Disposition: A | Discharge status: Home / Self Care | Payer: Self-pay | Source: Home / Self Care | Attending: Urology

## 2021-07-13 ENCOUNTER — Encounter (HOSPITAL_BASED_OUTPATIENT_CLINIC_OR_DEPARTMENT_OTHER): Payer: Self-pay | Admitting: Urology

## 2021-07-13 ENCOUNTER — Other Ambulatory Visit: Payer: Self-pay

## 2021-07-13 ENCOUNTER — Observation Stay (HOSPITAL_BASED_OUTPATIENT_CLINIC_OR_DEPARTMENT_OTHER)
Admission: RE | Admit: 2021-07-13 | Discharge: 2021-07-14 | Disposition: A | Discharge status: Home / Self Care | Payer: BC Managed Care – PPO | Attending: Urology | Admitting: Urology

## 2021-07-13 DIAGNOSIS — N210 Calculus in bladder: Secondary | ICD-10-CM | POA: Insufficient documentation

## 2021-07-13 DIAGNOSIS — Z85828 Personal history of other malignant neoplasm of skin: Secondary | ICD-10-CM | POA: Insufficient documentation

## 2021-07-13 DIAGNOSIS — N400 Enlarged prostate without lower urinary tract symptoms: Secondary | ICD-10-CM | POA: Diagnosis not present

## 2021-07-13 DIAGNOSIS — I10 Essential (primary) hypertension: Secondary | ICD-10-CM | POA: Diagnosis not present

## 2021-07-13 DIAGNOSIS — N200 Calculus of kidney: Secondary | ICD-10-CM | POA: Insufficient documentation

## 2021-07-13 DIAGNOSIS — R7303 Prediabetes: Secondary | ICD-10-CM | POA: Diagnosis not present

## 2021-07-13 DIAGNOSIS — R319 Hematuria, unspecified: Secondary | ICD-10-CM | POA: Diagnosis present

## 2021-07-13 HISTORY — PX: TRANSURETHRAL RESECTION OF PROSTATE: SHX73

## 2021-07-13 HISTORY — DX: Venous insufficiency (chronic) (peripheral): I87.2

## 2021-07-13 HISTORY — DX: Other complications of anesthesia, initial encounter: T88.59XA

## 2021-07-13 HISTORY — PX: CYSTOSCOPY/URETEROSCOPY/HOLMIUM LASER/STENT PLACEMENT: SHX6546

## 2021-07-13 LAB — POCT I-STAT, CHEM 8
BUN: 17 mg/dL (ref 8–23)
Calcium, Ion: 1.28 mmol/L (ref 1.15–1.40)
Chloride: 102 mmol/L (ref 98–111)
Creatinine, Ser: 0.9 mg/dL (ref 0.61–1.24)
Glucose, Bld: 101 mg/dL — ABNORMAL HIGH (ref 70–99)
HCT: 42 % (ref 39.0–52.0)
Hemoglobin: 14.3 g/dL (ref 13.0–17.0)
Potassium: 3.8 mmol/L (ref 3.5–5.1)
Sodium: 141 mmol/L (ref 135–145)
TCO2: 28 mmol/L (ref 22–32)

## 2021-07-13 SURGERY — CYSTOSCOPY/URETEROSCOPY/HOLMIUM LASER/STENT PLACEMENT
Anesthesia: General | Site: Prostate | Laterality: Left

## 2021-07-13 MED ORDER — HYDROMORPHONE HCL 1 MG/ML IJ SOLN: 0.5000 mg | INTRAMUSCULAR | Status: DC | PRN

## 2021-07-13 MED ORDER — OXYCODONE-ACETAMINOPHEN 5-325 MG PO TABS: 1.0000 | ORAL_TABLET | Freq: Four times a day (QID) | ORAL | 0 refills | Status: DC | PRN

## 2021-07-13 MED ORDER — SODIUM CHLORIDE 0.9 % IR SOLN
3000.0000 mL | Status: DC
  Administered 2021-07-14: 3000 mL

## 2021-07-13 MED ORDER — FENTANYL CITRATE (PF) 100 MCG/2ML IJ SOLN
INTRAMUSCULAR | Status: DC | PRN
  Administered 2021-07-13 (×4): 50 ug via INTRAVENOUS

## 2021-07-13 MED ORDER — ONDANSETRON HCL 4 MG/2ML IJ SOLN
INTRAMUSCULAR | Status: AC
  Filled 2021-07-13: qty 2

## 2021-07-13 MED ORDER — ACETAMINOPHEN 500 MG PO TABS
1000.0000 mg | ORAL_TABLET | Freq: Three times a day (TID) | ORAL | Status: AC
  Administered 2021-07-13 – 2021-07-14 (×3): 1000 mg via ORAL

## 2021-07-13 MED ORDER — FENTANYL CITRATE (PF) 100 MCG/2ML IJ SOLN
INTRAMUSCULAR | Status: AC
  Filled 2021-07-13: qty 2

## 2021-07-13 MED ORDER — OXYCODONE HCL 5 MG PO TABS: 5.0000 mg | ORAL_TABLET | ORAL | Status: DC | PRN

## 2021-07-13 MED ORDER — BISOPROLOL-HYDROCHLOROTHIAZIDE 5-6.25 MG PO TABS
1.0000 | ORAL_TABLET | Freq: Every day | ORAL | Status: DC
  Administered 2021-07-13: 1 via ORAL
  Filled 2021-07-13: qty 1

## 2021-07-13 MED ORDER — MIDAZOLAM HCL 5 MG/5ML IJ SOLN
INTRAMUSCULAR | Status: DC | PRN
  Administered 2021-07-13: 2 mg via INTRAVENOUS

## 2021-07-13 MED ORDER — DEXAMETHASONE SODIUM PHOSPHATE 10 MG/ML IJ SOLN
INTRAMUSCULAR | Status: DC | PRN
  Administered 2021-07-13: 5 mg via INTRAVENOUS

## 2021-07-13 MED ORDER — IOHEXOL 300 MG/ML  SOLN
INTRAMUSCULAR | Status: DC | PRN
  Administered 2021-07-13: 15 mL via URETHRAL

## 2021-07-13 MED ORDER — OXYCODONE HCL 5 MG PO TABS: 5.0000 mg | ORAL_TABLET | Freq: Once | ORAL | Status: DC | PRN

## 2021-07-13 MED ORDER — DEXAMETHASONE SODIUM PHOSPHATE 10 MG/ML IJ SOLN
INTRAMUSCULAR | Status: AC
  Filled 2021-07-13: qty 1

## 2021-07-13 MED ORDER — FINASTERIDE 5 MG PO TABS: 5.0000 mg | ORAL_TABLET | Freq: Every day | ORAL | 3 refills | Status: AC

## 2021-07-13 MED ORDER — SENNOSIDES-DOCUSATE SODIUM 8.6-50 MG PO TABS
1.0000 | ORAL_TABLET | Freq: Two times a day (BID) | ORAL | Status: DC
  Administered 2021-07-13: 1 via ORAL
  Filled 2021-07-13: qty 1

## 2021-07-13 MED ORDER — GENTAMICIN SULFATE 40 MG/ML IJ SOLN
5.0000 mg/kg | INTRAVENOUS | Status: AC
  Administered 2021-07-13: 430 mg via INTRAVENOUS
  Filled 2021-07-13: qty 10.75

## 2021-07-13 MED ORDER — LACTATED RINGERS IV SOLN: INTRAVENOUS | Status: DC

## 2021-07-13 MED ORDER — PROPOFOL 10 MG/ML IV BOLUS
INTRAVENOUS | Status: DC | PRN
  Administered 2021-07-13: 200 mg via INTRAVENOUS

## 2021-07-13 MED ORDER — ONDANSETRON HCL 4 MG/2ML IJ SOLN
INTRAMUSCULAR | Status: DC | PRN
  Administered 2021-07-13: 4 mg via INTRAVENOUS

## 2021-07-13 MED ORDER — FENOFIBRATE 54 MG PO TABS
54.0000 mg | ORAL_TABLET | Freq: Every day | ORAL | Status: DC
Start: 2021-07-13 — End: 2021-07-14
  Administered 2021-07-13: 54 mg via ORAL
  Filled 2021-07-13: qty 1

## 2021-07-13 MED ORDER — EPHEDRINE SULFATE-NACL 50-0.9 MG/10ML-% IV SOSY
PREFILLED_SYRINGE | INTRAVENOUS | Status: DC | PRN
  Administered 2021-07-13: 5 mg via INTRAVENOUS

## 2021-07-13 MED ORDER — KETOROLAC TROMETHAMINE 30 MG/ML IJ SOLN: 30.0000 mg | Freq: Once | INTRAMUSCULAR | Status: DC | PRN

## 2021-07-13 MED ORDER — LIDOCAINE HCL (PF) 2 % IJ SOLN
INTRAMUSCULAR | Status: AC
  Filled 2021-07-13: qty 5

## 2021-07-13 MED ORDER — SENNOSIDES-DOCUSATE SODIUM 8.6-50 MG PO TABS: 1.0000 | ORAL_TABLET | Freq: Two times a day (BID) | ORAL | 0 refills | Status: DC

## 2021-07-13 MED ORDER — CEPHALEXIN 500 MG PO CAPS: 500.0000 mg | ORAL_CAPSULE | Freq: Two times a day (BID) | ORAL | 0 refills | Status: AC

## 2021-07-13 MED ORDER — FINASTERIDE 5 MG PO TABS
5.0000 mg | ORAL_TABLET | Freq: Every day | ORAL | Status: DC
  Administered 2021-07-13: 5 mg via ORAL
  Filled 2021-07-13: qty 1

## 2021-07-13 MED ORDER — EPHEDRINE 5 MG/ML INJ
INTRAVENOUS | Status: AC
  Filled 2021-07-13: qty 5

## 2021-07-13 MED ORDER — ATORVASTATIN CALCIUM 40 MG PO TABS
40.0000 mg | ORAL_TABLET | Freq: Every evening | ORAL | Status: DC
  Administered 2021-07-13: 40 mg via ORAL
  Filled 2021-07-13: qty 1

## 2021-07-13 MED ORDER — ONDANSETRON HCL 4 MG/2ML IJ SOLN: 4.0000 mg | Freq: Once | INTRAMUSCULAR | Status: DC | PRN

## 2021-07-13 MED ORDER — PROPOFOL 10 MG/ML IV BOLUS
INTRAVENOUS | Status: AC
  Filled 2021-07-13: qty 20

## 2021-07-13 MED ORDER — SODIUM CHLORIDE 0.9 % IR SOLN
Status: DC | PRN
  Administered 2021-07-13 (×3): 6000 mL

## 2021-07-13 MED ORDER — MIDAZOLAM HCL 2 MG/2ML IJ SOLN
INTRAMUSCULAR | Status: AC
  Filled 2021-07-13: qty 2

## 2021-07-13 MED ORDER — OXYCODONE HCL 5 MG/5ML PO SOLN: 5.0000 mg | Freq: Once | ORAL | Status: DC | PRN

## 2021-07-13 MED ORDER — HYDROMORPHONE HCL 2 MG/ML IJ SOLN
INTRAMUSCULAR | Status: AC
  Filled 2021-07-13: qty 1

## 2021-07-13 MED ORDER — SODIUM CHLORIDE 0.9 % IV SOLN: INTRAVENOUS | Status: DC

## 2021-07-13 MED ORDER — ACETAMINOPHEN 500 MG PO TABS
ORAL_TABLET | ORAL | Status: AC
  Filled 2021-07-13: qty 2

## 2021-07-13 MED ORDER — FENTANYL CITRATE (PF) 100 MCG/2ML IJ SOLN: 25.0000 ug | INTRAMUSCULAR | Status: DC | PRN

## 2021-07-13 MED ORDER — LIDOCAINE 2% (20 MG/ML) 5 ML SYRINGE
INTRAMUSCULAR | Status: DC | PRN
  Administered 2021-07-13: 100 mg via INTRAVENOUS

## 2021-07-13 MED ORDER — HYDROMORPHONE HCL 1 MG/ML IJ SOLN
INTRAMUSCULAR | Status: DC | PRN
  Administered 2021-07-13: 1 mg via INTRAVENOUS

## 2021-07-13 SURGICAL SUPPLY — 34 items
BAG DRAIN URO-CYSTO SKYTR STRL (DRAIN) ×3 IMPLANT
BAG DRN RND TRDRP ANRFLXCHMBR (UROLOGICAL SUPPLIES) ×2
BAG DRN UROCATH (DRAIN) ×2
BAG URINE DRAIN 2000ML AR STRL (UROLOGICAL SUPPLIES) ×3 IMPLANT
BASKET LASER NITINOL 1.9FR (BASKET) IMPLANT
BSKT STON RTRVL 120 1.9FR (BASKET)
CATH FOLEY 3WAY 30CC 24FR (CATHETERS) ×3
CATH INTERMIT  6FR 70CM (CATHETERS) ×3 IMPLANT
CATH UROLOGY TORQUE 40 (MISCELLANEOUS) ×3 IMPLANT
CATH URTH STD 24FR FL 3W 2 (CATHETERS) ×2 IMPLANT
CLOTH BEACON ORANGE TIMEOUT ST (SAFETY) ×3 IMPLANT
FIBER LASER FLEXIVA 365 (UROLOGICAL SUPPLIES) IMPLANT
GLOVE SURG ENC MOIS LTX SZ7.5 (GLOVE) ×3 IMPLANT
GOWN STRL REUS W/TWL LRG LVL3 (GOWN DISPOSABLE) ×3 IMPLANT
GUIDEWIRE ANG ZIPWIRE 038X150 (WIRE) ×3 IMPLANT
GUIDEWIRE STR DUAL SENSOR (WIRE) ×3 IMPLANT
HOLDER FOLEY CATH W/STRAP (MISCELLANEOUS) ×6 IMPLANT
IV NS 1000ML (IV SOLUTION)
IV NS 1000ML BAXH (IV SOLUTION) IMPLANT
IV NS IRRIG 3000ML ARTHROMATIC (IV SOLUTION) ×24 IMPLANT
KIT TURNOVER CYSTO (KITS) ×3 IMPLANT
LOOP CUT BIPOLAR 24F LRG (ELECTROSURGICAL) ×3 IMPLANT
MANIFOLD NEPTUNE II (INSTRUMENTS) ×3 IMPLANT
NS IRRIG 500ML POUR BTL (IV SOLUTION) ×3 IMPLANT
PACK CYSTO (CUSTOM PROCEDURE TRAY) ×3 IMPLANT
SYR 10ML LL (SYRINGE) ×3 IMPLANT
SYR 30ML LL (SYRINGE) ×3 IMPLANT
SYR TOOMEY IRRIG 70ML (MISCELLANEOUS) ×3
SYRINGE TOOMEY IRRIG 70ML (MISCELLANEOUS) ×2 IMPLANT
TRACTIP FLEXIVA PULS ID 200XHI (Laser) IMPLANT
TRACTIP FLEXIVA PULSE ID 200 (Laser)
TUBE CONNECTING 12X1/4 (SUCTIONS) ×3 IMPLANT
TUBE FEEDING 8FR 16IN STR KANG (MISCELLANEOUS) ×3 IMPLANT
TUBING UROLOGY SET (TUBING) ×3 IMPLANT

## 2021-07-13 NOTE — H&P (Signed)
Tony Bailey is an 60 y.o. male.    Chief Complaint: Pre-Op LEFT Ureteroscopic Stone Manipulation  HPI:   1 - Urolithiasis - several passage episdoes small stones w/o much colic  12/5007 - 38HW left renal stone, no hydro on eval hematuria. Cr 1.1, Not easily seen on CT scout images.   2 - Gross Hematuria - non smoker. Gross hematuria x several 2022. Non-con CT 06/2021 with left renal pelvis stone.   3 - Prostate Screening - pt's faterh with prostate cancer. Large prostate with median lobe by CT 2022.  06/2021 0 PSA 1.35   4 - Bilateral R>L Non-Complex Renal Cysts - Rt mid 4cm x 2, Lt mid 2cm cysts on CT 06/2021. No mass effect.   PMH sig for obesity, preDM, VAricose veins, melanoma (gets annual skin exam). He works in Horticulturist, commercial. His PCP is Tony Pinto MD   Today " Tony Bailey " is seen ro proceed with cysto, bilateral retrogrades LEFT ureteroscopic stone manipulation to complete hematuria eval and goal of stone free.   Past Medical History:  Diagnosis Date   Anesthesia complication    Pt stated that years ago when he was having surgery for a perirectal abcess his heart stopped. He stated that he was told it was due to the position he was in and the anesthesia going to his heart. He said they were able to get him back quickly. Since then he has had surgeries with no complications.   Anxiety    Bleeding from varicose vein    Hyperlipidemia    Hypertension    Kidney stones    passed stones   Melanoma (Tony Bailey) 2009   upper back mid right side   OSA (obstructive sleep apnea)    Other testicular hypofunction    Prediabetes 05/09/2019   Sciatic nerve pain 2010   hx - no current problems per patient 07/06/20   Sleep apnea    uses cpap nightly   Testosterone deficiency 10/13/2013   Unspecified vitamin D deficiency    Venous insufficiency    bilateral    Past Surgical History:  Procedure Laterality Date   COLONOSCOPY  04/13/2010   [facility]    HERNIA  REPAIR Left groin   1970   HERNIA REPAIR Right groin   2011   INCISION AND DRAINAGE PERIRECTAL ABSCESS     x 2   MELANOMA EXCISION  2009   melanona  2009   upper back -mid right side   NASAL SEPTUM SURGERY     SHOULDER ARTHROSCOPY Left 2003   TISSUE GRAFT     hx of gum grafts   WISDOM TOOTH EXTRACTION  1976    Family History  Problem Relation Age of Onset   Aneurysm Mother 55   AAA (abdominal aortic aneurysm) Father    Kidney disease Father    Kidney failure Father    Prostate cancer Father    CAD Father    AAA (abdominal aortic aneurysm) Paternal Aunt    CAD Paternal Aunt    Heart attack Paternal Uncle        Several paternal uncles with CAD hx   Colon cancer Neg Hx    Stomach cancer Neg Hx    Rectal cancer Neg Hx    Social History:  reports that he has never smoked. He has never used smokeless tobacco. He reports that he does not drink alcohol and does not use drugs.  Allergies: No Known Allergies  No medications  prior to admission.    No results found for this or any previous visit (from the past 48 hour(s)). No results found.  Review of Systems  Constitutional:  Negative for chills and fever.  All other systems reviewed and are negative.  Height 6' 1.5" (1.867 m), weight 94.8 kg. Physical Exam Vitals reviewed.  HENT:     Head: Normocephalic.  Eyes:     Pupils: Pupils are equal, round, and reactive to light.  Pulmonary:     Effort: Pulmonary effort is normal.  Abdominal:     General: Abdomen is flat.  Genitourinary:    Comments: No CVAT at present Musculoskeletal:        General: Normal range of motion.     Cervical back: Normal range of motion.  Skin:    General: Skin is warm.  Neurological:     General: No focal deficit present.     Mental Status: He is alert.     Assessment/Plan  Proceed as planned with cysto bilateral retrogrades, LEFT ureteroscopic stone manipulation. Risks, benefits, alternatives, expected peri-op course discussed  previously and reiterated today.   Tony Frock, MD 07/13/2021, 8:39 AM

## 2021-07-13 NOTE — Transfer of Care (Signed)
Immediate Anesthesia Transfer of Care Note  Patient: Tony Bailey  Procedure(s) Performed: CYSTOSCOPY BILATERAL RETROGRADE PYELOGRAM/CYSTOLITHALOPAXY (Left: Pelvis) TRANSURETHRAL RESECTION OF THE PROSTATE (TURP) (Prostate)  Patient Location: PACU  Anesthesia Type:General  Level of Consciousness: drowsy, patient cooperative and responds to stimulation  Airway & Oxygen Therapy: Patient Spontanous Breathing and Patient connected to face mask oxygen  Post-op Assessment: Report given to RN and Post -op Vital signs reviewed and stable  Post vital signs: Reviewed and stable  Last Vitals:  Vitals Value Taken Time  BP 153/97 07/13/21 1521  Temp    Pulse 88 07/13/21 1524  Resp 9 07/13/21 1524  SpO2 98 % 07/13/21 1524  Vitals shown include unvalidated device data.  Last Pain:  Vitals:   07/13/21 1250  TempSrc: Oral  PainSc: 0-No pain      Patients Stated Pain Goal: 4 (83/35/82 5189)  Complications: No notable events documented.

## 2021-07-13 NOTE — OR Nursing (Signed)
Dr. Tresa Moore spoke with Tony Bailey's spouse, Anderson Malta via telephone to get verbal consent to  perform a transurethral resection of prostate, due to enlarged, bleeding prostate and that he is not able to do the consented procedure due to the enlarged prostate. Explained risks/benefits of procedure and the need for overnight stay and urethral catheter placement.

## 2021-07-13 NOTE — Anesthesia Preprocedure Evaluation (Signed)
Anesthesia Evaluation  Patient identified by MRN, date of birth, ID band Patient awake    Reviewed: Allergy & Precautions, NPO status , Patient's Chart, lab work & pertinent test results  Airway Mallampati: II  TM Distance: >3 FB Neck ROM: Full    Dental no notable dental hx.    Pulmonary sleep apnea ,    Pulmonary exam normal breath sounds clear to auscultation       Cardiovascular hypertension, Normal cardiovascular exam Rhythm:Regular Rate:Normal     Neuro/Psych negative neurological ROS  negative psych ROS   GI/Hepatic negative GI ROS, Neg liver ROS,   Endo/Other  negative endocrine ROS  Renal/GU negative Renal ROS  negative genitourinary   Musculoskeletal negative musculoskeletal ROS (+)   Abdominal   Peds negative pediatric ROS (+)  Hematology negative hematology ROS (+)   Anesthesia Other Findings   Reproductive/Obstetrics negative OB ROS                             Anesthesia Physical Anesthesia Plan  ASA: 2  Anesthesia Plan: General   Post-op Pain Management:    Induction: Intravenous  PONV Risk Score and Plan: 2 and Ondansetron, Dexamethasone and Treatment may vary due to age or medical condition  Airway Management Planned: LMA  Additional Equipment:   Intra-op Plan:   Post-operative Plan: Extubation in OR  Informed Consent: I have reviewed the patients History and Physical, chart, labs and discussed the procedure including the risks, benefits and alternatives for the proposed anesthesia with the patient or authorized representative who has indicated his/her understanding and acceptance.     Dental advisory given  Plan Discussed with: CRNA and Surgeon  Anesthesia Plan Comments:         Anesthesia Quick Evaluation

## 2021-07-13 NOTE — Discharge Instructions (Addendum)
1 - You may have urinary urgency (bladder spasms) and bloody urine on / off with passage of occasional small tissue for up to 3 weeks.  This is normal.  2 - Call MD or go to ER for fever >102, severe pain / nausea / vomiting not relieved by medications, or acute change in medical status

## 2021-07-13 NOTE — Brief Op Note (Signed)
07/13/2021  3:07 PM  PATIENT:  Tony Bailey  60 y.o. male  PRE-OPERATIVE DIAGNOSIS:  LEFT RENAL STONE HEMATURIA  POST-OPERATIVE DIAGNOSIS:  LEFT RENAL STONE HEMATURIA; ENLARGED PROSTATE, BLADDER STONE  PROCEDURE:  Procedure(s): CYSTOSCOPY BILATERAL RETROGRADE PYELOGRAM/CYSTOLITHALOPAXY (Left) TRANSURETHRAL RESECTION OF THE PROSTATE (TURP)  SURGEON:  Surgeon(s) and Role:    Alexis Frock, MD - Primary  PHYSICIAN ASSISTANT:   ASSISTANTS: none   ANESTHESIA:   general  EBL:  169mL   BLOOD ADMINISTERED:none  DRAINS:  20F 3 way foley to NS irrigation    LOCAL MEDICATIONS USED:  NONE  SPECIMEN:  Source of Specimen:  1 - bladder stone (given to patient), 2 - prostate chips (pathology)  DISPOSITION OF SPECIMEN:   as per above  COUNTS:  YES  TOURNIQUET:  * No tourniquets in log *  DICTATION: .Other Dictation: Dictation Number [id number]  PLAN OF CARE: Admit for overnight observation  PATIENT DISPOSITION:  PACU - hemodynamically stable.   Delay start of Pharmacological VTE agent (>24hrs) due to surgical blood loss or risk of bleeding: yes

## 2021-07-13 NOTE — Anesthesia Procedure Notes (Signed)
Procedure Name: LMA Insertion Date/Time: 07/13/2021 1:36 PM Performed by: Rogers Blocker, CRNA Pre-anesthesia Checklist: Patient identified, Emergency Drugs available, Suction available and Patient being monitored Patient Re-evaluated:Patient Re-evaluated prior to induction Oxygen Delivery Method: Circle System Utilized Preoxygenation: Pre-oxygenation with 100% oxygen Induction Type: IV induction Ventilation: Mask ventilation without difficulty LMA: LMA inserted LMA Size: 4.0 Number of attempts: 1 Airway Equipment and Method: Bite block Placement Confirmation: positive ETCO2 Tube secured with: Tape Dental Injury: Teeth and Oropharynx as per pre-operative assessment

## 2021-07-14 DIAGNOSIS — N400 Enlarged prostate without lower urinary tract symptoms: Secondary | ICD-10-CM | POA: Diagnosis not present

## 2021-07-14 LAB — BASIC METABOLIC PANEL
Anion gap: 9 (ref 5–15)
BUN: 16 mg/dL (ref 8–23)
CO2: 25 mmol/L (ref 22–32)
Calcium: 9.2 mg/dL (ref 8.9–10.3)
Chloride: 103 mmol/L (ref 98–111)
Creatinine, Ser: 0.99 mg/dL (ref 0.61–1.24)
GFR, Estimated: >60 mL/min (ref >60)
Glucose, Bld: 134 mg/dL — ABNORMAL HIGH (ref 70–99)
Potassium: 3.8 mmol/L (ref 3.5–5.1)
Sodium: 137 mmol/L (ref 135–145)

## 2021-07-14 LAB — HEMOGLOBIN AND HEMATOCRIT, BLOOD
HCT: 40.4 % (ref 39.0–52.0)
Hemoglobin: 13.7 g/dL (ref 13.0–17.0)

## 2021-07-14 MED ORDER — ACETAMINOPHEN 500 MG PO TABS
ORAL_TABLET | ORAL | Status: AC
  Filled 2021-07-14: qty 2

## 2021-07-16 ENCOUNTER — Encounter (HOSPITAL_BASED_OUTPATIENT_CLINIC_OR_DEPARTMENT_OTHER): Payer: Self-pay | Admitting: Urology

## 2021-07-16 LAB — SURGICAL PATHOLOGY

## 2021-07-16 NOTE — Op Note (Signed)
NAMEALBI, RAPPAPORT MEDICAL RECORD NO: [id number] ACCOUNT NO: [id number] DATE OF BIRTH: 29-Jul-1961 FACILITY: [facility] LOCATION: [facility] PHYSICIAN: Alexis Frock, MD  Operative Report   DATE OF PROCEDURE: 07/13/2021  PREOPERATIVE DIAGNOSES:  Small bladder stone, left renal stone, hematuria.  POSTOPERATIVE DIAGNOSES:  Small bladder stone, left renal stone, hematuria plus very large prostate.  PROCEDURE PERFORMED:  1.  Cystoscopy, bilateral retrograde pyelograms interpretation. 2.  Cystolitholapaxy, stone less than 2 cm. 3.  Transurethral resection of the prostate.  ESTIMATED BLOOD LOSS:  100 mL.  COMPLICATIONS:  None.  SPECIMENS: 1.  Bladder stone to be given to the patient. 2.  Prostate chips for permanent pathology.  FINDINGS:  1.  Very large trilobar prostatic hypertrophy with a very friable median lobe. 2.  Left lower mid renal stone without hydronephrosis by retrograde pyelogram. 3.  Unremarkable right retrograde pyelogram. 4.  Approximately 6 mm bladder stone. 5.  Complete resolution of all bladder stone and obstructing prostate following transurethral resection. 6.  Inability to obtain wire cannulation of the left ureter and inability to perform left ureteroscopy.  INDICATIONS FOR PROCEDURE:  The patient is a very pleasant 60 year old man who was found on workup of gross hematuria  what appeared to be a small bladder stone and fairly large left renal stone.  He underwent noncontrast axial imaging, with the above  findings.  Options were discussed for management including most conservative option of office cystoscopy to finish hematuria evaluation, observation of stones versus more aggressive therapy with operative retrogrades and cystoscopy and left ureteroscopy  with goal of stone free to complete his hematuria evaluation and achieve stone free status and he wished to proceed with the latter.  Informed consent was obtained and placed in the medical record.  PROCEDURE  IN DETAIL:  The patient being verified, procedure being cystoscopy, bilateral retrogrades, left ureteroscopic stone manipulation was confirmed.  Procedure timeout was performed.  Intravenous antibiotics administered.  General anesthesia  induced. The patient was placed into a low lithotomy position.  Sterile field was created, prepped and draped the patient's penis, perineum and proximal thigh using iodine.  Cystourethroscopy was performed using a 21-French rigid cystoscope with offset  lens.  Inspection of the anterior and posterior urethra revealed a very large prostate with approximately 50% or more median lobe, this was incredibly friable.  There was a small bladder stone, approximately 6 mm at the bladder neck, median lobe area.   This was irrigated via the cystoscope, set aside.  The bladder was mildly trabeculated.  Attention was then directed at retrogrades.  The ureteral orifices were very difficult to appreciate given the large median lobe, but they were identified.  The  right ureteral orifice was cannulated with 6-French end-hole catheter and right retrograde pyelogram was obtained.  Right retrograde pyelogram demonstrated a single right ureter, single system right kidney.  No filling defects or narrowing noted.  Similarly, left retrograde pyelogram was obtained.  Left retrograde pyelogram demonstrated single left ureter, single system left kidney.  There was severe J-hooking of the ureter.  There was a filling defect in the lower mid renal pelvis area consistent with known stone.  There was no hydronephrosis  noted, whatsoever.  At this point, visualization was already becoming poor, just with gentle manipulation of the median lobe to access the ureters. Multiple angulations were performed using an angle-tipped ZIPwire as well as sensor wire to try to gain  access to the level of the kidney; however, due to severe J-hooking of the distal  ureter, this was not possible.  Multiple angulation  attempts, the gross hematuria from the prostate median lobe became increasing to the point I clearly felt that it would  not be safe to ignore his high chance of clot retention postoperatively.  At this point, it was felt that it would not be technically feasible or safe to address the left renal stone at this point, his more pressing issue and also the likely source of  his hematuria is clearly felt to be his massive prostate median lobe.  I discussed these findings intraoperatively with the patient's wife and options including termination of the procedure today versus changing goals and addressing the very large median  lobe with transurethral resection with goal of improving voiding symptoms, reducing his nidus for future hematuria and allowing better anatomic angulation to address his renal stone at a later date.  He understood the findings and change of course, I  did explain that this would significantly alter his postoperative course as this would require catheterization, overnight admission and voiding trial in several days.  He voiced understanding and desired for [place] to proceed.  As such, a 26-French  resectoscope sheath with visual obturator was used to visualize the bladder, using continuous flow irrigation with normal saline and medium size bipolar resectoscope loop, very careful transurethral resection of the prostate was performed, first to the  area of the median lobe down to the most superficial fibromuscular stroma of the urinary bladder.  This greatly improved visualization of the bladder and now allowed much easier visualization of the ureteral orifices.  Next, resection of the left lobe  was performed in top-down orientation of the bladder neck to the area of the verumontanum, taking exquisite care to avoid undermining the bladder neck.  The depth was gauged to the superficial capsular fibers of the prostate capsule and a mirror image  resection was performed on the right side.  This  resulted in excellent wide open fossa from the bladder neck to the area of the verumontanum.  Prostate chips were irrigated, set aside for permanent  pathology and the entire base of the resection area was  fulgurated in a descending spiral fashion, which resulted in excellent hemostasis.  At this point, hemostasis was quite acceptable.  There is complete resolution of the obstructing area of the prostate.  No evidence of any bladder perforation.  All  visualized prostate chips were then removed. Under cystoscopic guidance, a sensor wire was placed per bladder and a new 24-French 3-way Foley catheter was placed over this to the level of the urinary bladder to avoid trauma to the bladder neck.  This was  inflated to 30 mL sterile water in the balloon, connected to gentle traction and normal saline irrigation.  Efflux was light pink.  Procedure was terminated.  The patient tolerated the procedure well.  No immediate perioperative complications.  The  patient was taken to postanesthesia care in stable condition.  Plan for observation admission, likely discharge home tomorrow with his catheter after verifying hemodynamic stability and likely trial of void in several days in the office.   SHW D: 07/13/2021 3:16:47 pm T: 07/14/2021 3:57:00 am  JOB: [id number]/ [id number]

## 2021-07-16 NOTE — Anesthesia Postprocedure Evaluation (Signed)
Anesthesia Post Note  Patient: Tony Bailey  Procedure(s) Performed: CYSTOSCOPY BILATERAL RETROGRADE PYELOGRAM/CYSTOLITHALOPAXY (Left: Pelvis) TRANSURETHRAL RESECTION OF THE PROSTATE (TURP) (Prostate)     Patient location during evaluation: PACU Anesthesia Type: General Level of consciousness: awake and alert Pain management: pain level controlled Vital Signs Assessment: post-procedure vital signs reviewed and stable Respiratory status: spontaneous breathing, nonlabored ventilation, respiratory function stable and patient connected to nasal cannula oxygen Cardiovascular status: blood pressure returned to baseline and stable Postop Assessment: no apparent nausea or vomiting Anesthetic complications: no   No notable events documented.  Last Vitals:  Vitals:   07/14/21 0628 07/14/21 0821  BP: (!) 147/81 140/89  Pulse: 67 65  Resp: 16 20  Temp: 36.5 C 36.7 C  SpO2: 96% 97%    Last Pain:  Vitals:   07/14/21 0821  TempSrc:   PainSc: 1                  Maddeline Roorda S

## 2021-07-18 ENCOUNTER — Ambulatory Visit: Payer: BC Managed Care – PPO | Admitting: Vascular Surgery

## 2021-07-31 NOTE — Discharge Summary (Signed)
Physician Discharge Summary  Patient ID: Tony Bailey MRN: [id number] DOB/AGE: 06-11-1961 60 y.o.  Admit date: 07/13/2021 Discharge date: 07/14/2021  Admission Diagnoses:  BPH  Discharge Diagnoses:  Active Problems:   Prostatic hypertrophy   Past Medical History:  Diagnosis Date   Anesthesia complication    Pt stated that years ago when he was having surgery for a perirectal abcess his heart stopped. He stated that he was told it was due to the position he was in and the anesthesia going to his heart. He said they were able to get him back quickly. Since then he has had surgeries with no complications.   Anxiety    Bleeding from varicose vein    Hyperlipidemia    Hypertension    Kidney stones    passed stones   Melanoma ([place]) 2009   upper back mid right side   OSA (obstructive sleep apnea)    Other testicular hypofunction    Prediabetes 05/09/2019   Sciatic nerve pain 2010   hx - no current problems per patient 07/06/20   Sleep apnea    uses cpap nightly   Testosterone deficiency 10/13/2013   Unspecified vitamin D deficiency    Venous insufficiency    bilateral    Surgeries: Procedure(s): CYSTOSCOPY BILATERAL RETROGRADE PYELOGRAM/CYSTOLITHALOPAXY TRANSURETHRAL RESECTION OF THE PROSTATE (TURP) on 07/13/2021   Consultants (if any):   Discharged Condition: Improved  Hospital Course: Tony Bailey is an 60 y.o. male who was admitted 07/13/2021 with a diagnosis of <principal problem not specified> and went to the operating room on 07/13/2021 and underwent the above named procedures.    He was given perioperative antibiotics:  Anti-infectives (From admission, onward)    Start     Dose/Rate Route Frequency Ordered Stop   07/13/21 1216  gentamicin (GARAMYCIN) 430 mg in dextrose 5 % 100 mL IVPB        5 mg/kg  86.6 kg (Adjusted) 110.8 mL/hr over 60 Minutes Intravenous 30 min pre-op 07/13/21 1216 07/13/21 1332   07/13/21 0000  cephALEXin (KEFLEX) 500 MG capsule         500 mg Oral 2 times daily 07/13/21 1522 07/17/21 2359     .  He was given sequential compression devices, early ambulation for DVT prophylaxis.  He benefited maximally from the hospital stay and there were no complications.    Recent vital signs:  Vitals:   07/14/21 0628 07/14/21 0821  BP: (!) 147/81 140/89  Pulse: 67 65  Resp: 16 20  Temp: 97.7 F (36.5 C) 98.1 F (36.7 C)  SpO2: 96% 97%    Recent laboratory studies:  Lab Results  Component Value Date   HGB 13.7 07/14/2021   HGB 14.3 07/13/2021   HGB 15.3 06/27/2021   Lab Results  Component Value Date   WBC 5.4 06/27/2021   PLT 342 06/27/2021   No results found for: INR Lab Results  Component Value Date   NA 137 07/14/2021   K 3.8 07/14/2021   CL 103 07/14/2021   CO2 25 07/14/2021   BUN 16 07/14/2021   CREATININE 0.99 07/14/2021   GLUCOSE 134 (H) 07/14/2021    Discharge Medications:   Allergies as of 07/14/2021   No Known Allergies      Medication List     STOP taking these medications    aspirin 81 MG tablet   tamsulosin 0.4 MG Caps capsule Commonly known as: FLOMAX       TAKE these medications  atorvastatin 40 MG tablet Commonly known as: LIPITOR TAKE 1 TABLET BY MOUTH DAILY FOR CHOLESTEROL What changed: when to take this   bisoprolol-hydrochlorothiazide 5-6.25 MG tablet Commonly known as: ZIAC Take 1 tablet Daily for BP   Cinnamon 500 MG capsule Take 100 mg by mouth 2 (two) times daily.   fenofibrate 145 MG tablet Commonly known as: TRICOR TAKE 1 TABLET BY MOUTH DAILY FOR TRIGLYCERIDES( BLOOD FATS) What changed: when to take this   finasteride 5 MG tablet Commonly known as: Proscar Take 1 tablet (5 mg total) by mouth daily. To prevent prostate bleeding and regrowth   FISH OIL PO Take 2,400 mg by mouth 2 (two) times daily.   fluticasone 50 MCG/ACT nasal spray Commonly known as: FLONASE Use 1 to 2 sprays each Nostril 1 to 2 x /Tony   multivitamin tablet Take 1 tablet by  mouth daily.   oxyCODONE-acetaminophen 5-325 MG tablet Commonly known as: Percocet Take 1 tablet by mouth every 6 (six) hours as needed for severe pain or moderate pain.   senna-docusate 8.6-50 MG tablet Commonly known as: Senokot-S Take 1 tablet by mouth 2 (two) times daily. While taking strong pain meds to prevent constipation   triamcinolone ointment 0.1 % Commonly known as: KENALOG Apply 1 application topically 2 (two) times daily.   VITAMIN D-3 PO Take 10,000 Units by mouth daily.   zinc gluconate 50 MG tablet Take 50 mg by mouth daily.       ASK your doctor about these medications    cephALEXin 500 MG capsule Commonly known as: KEFLEX Take 1 capsule (500 mg total) by mouth 2 (two) times daily for 4 days. To prevent infection with catheter in place. Ask about: Should I take this medication?        Diagnostic Studies: No results found.  Disposition: Discharge disposition: 01-Home or Self Care       Discharge Instructions     Discharge patient   Complete by: As directed    Discharge disposition: 01-Home or Self Care   Discharge patient date: 07/14/2021        Follow-up Information     Alexis Frock, MD Follow up on 07/17/2021.   Specialty: Urology Why: Office will call to arrange visit for catheter removal. Contact information: [place] [id number] [phone]                  Signed: Nicolette Bang 07/31/2021, 9:40 AM

## 2021-08-06 ENCOUNTER — Other Ambulatory Visit: Payer: Self-pay | Admitting: *Deleted

## 2021-08-06 MED ORDER — LORAZEPAM 1 MG PO TABS: ORAL_TABLET | ORAL | 0 refills | Status: DC

## 2021-08-08 ENCOUNTER — Encounter: Payer: Self-pay | Admitting: Vascular Surgery

## 2021-08-08 ENCOUNTER — Ambulatory Visit (INDEPENDENT_AMBULATORY_CARE_PROVIDER_SITE_OTHER): Payer: BC Managed Care – PPO | Admitting: Vascular Surgery

## 2021-08-08 ENCOUNTER — Other Ambulatory Visit: Payer: Self-pay

## 2021-08-08 VITALS — BP 137/87 | HR 77 | Temp 98.0°F | Resp 16 | Ht 73.0 in | Wt 207.0 lb

## 2021-08-08 DIAGNOSIS — I83812 Varicose veins of left lower extremities with pain: Secondary | ICD-10-CM | POA: Diagnosis not present

## 2021-08-08 HISTORY — PX: ENDOVENOUS ABLATION SAPHENOUS VEIN W/ LASER: SUR449

## 2021-08-08 NOTE — Progress Notes (Signed)
     Laser Ablation Procedure    Date: 08/08/2021   Tony Bailey DOB:[birth year]  Consent signed: Yes      Surgeon: Gae Gallop MD   Procedure: Laser Ablation: left Greater Saphenous Vein  BP 137/87 (BP Location: Left Arm, Patient Position: Sitting, Cuff Size: Normal)   Pulse 77   Temp 98 F (36.7 C) (Temporal)   Resp 16   Ht 6\' 1"  (1.854 m)   Wt 207 lb (93.9 kg)   SpO2 99%   BMI 27.31 kg/m   Tumescent Anesthesia: 700 cc 0.9% NaCl with 50 cc Lidocaine HCL 1%  and 15 cc 8.4% NaHCO3  Local Anesthesia: 4 cc Lidocaine HCL and NaHCO3 (ratio 2:1)  7 watts continuous mode     Total energy: 1858 Joules    Total time: 265 seconds Treatment Length 39 cm   Laser Fiber Ref. #  [id number]     Lot #  [id number]   Stab Phlebectomy: >20 incisions  Sites: Thigh and Calf  Patient tolerated procedure well  Notes: Patient wore face mask.  All staff members wore facial masks and facial shields/goggles.  Tony Bailey took Ativan 1 mg on 08-08-2021 at 7:45 AM.    Description of Procedure:  After marking the course of the secondary varicosities, the patient was placed on the operating table in the supine position, and the left leg was prepped and draped in sterile fashion.   Local anesthetic was administered and under ultrasound guidance the saphenous vein was accessed with a micro needle and guide wire; then the mirco puncture sheath was placed.  A guide wire was inserted saphenofemoral junction , followed by a 5 french sheath.  The position of the sheath and then the laser fiber below the junction was confirmed using the ultrasound.  Tumescent anesthesia was administered along the course of the saphenous vein using ultrasound guidance. The patient was placed in Trendelenburg position and protective laser glasses were placed on patient and staff, and the laser was fired at 7 watts continuous mode for a total of 1858 joules.   For stab phlebectomies, local anesthetic was administered at the  previously marked varicosities, and tumescent anesthesia was administered around the vessels.  Greater than 20 stab wounds were made using the tip of an 11 blade. And using the vein hook, the phlebectomies were performed using a hemostat to avulse the varicosities.  Adequate hemostasis was achieved.     Steri strips were applied to the stab wounds and ABD pads and thigh high compression stockings were applied.  Ace wrap bandages were applied over the phlebectomy sites and at the top of the saphenofemoral junction. Blood loss was less than 15 cc.  Discharge instructions reviewed with patient and hardcopy of discharge instructions given to patient to take home. The patient ambulated out of the operating room having tolerated the procedure well.

## 2021-08-08 NOTE — Progress Notes (Signed)
Patient name: Tony Bailey MRN: [id number] DOB: [birth year] Sex: male  REASON FOR VISIT: For laser ablation of the left great saphenous vein greater than 20 stabs.  HPI: Tony Bailey is a 60 y.o. male who I saw on 06/21/2021 with chronic venous insufficiency.  He had CEAP C4c venous disease.  He had 4 bleeding episodes from his left leg and was at high risk for recurrent bleeding episodes.  He has been wearing his thigh-high stockings, elevating his legs, and trying to avoid prolonged sitting and standing.  I felt that he was a good candidate for laser ablation of the left great saphenous vein and I felt that I would likely cannulate the vein in the distal thigh.  He would require greater than 20 stab phlebectomies and 2 units of sclerotherapy for the areas of bleeding on the left foot.  Current Outpatient Medications  Medication Sig Dispense Refill   atorvastatin (LIPITOR) 40 MG tablet TAKE 1 TABLET BY MOUTH DAILY FOR CHOLESTEROL (Patient taking differently: every evening. TAKE 1 TABLET BY MOUTH DAILY FOR CHOLESTEROL) 90 tablet 2   bisoprolol-hydrochlorothiazide (ZIAC) 5-6.25 MG tablet Take 1 tablet Daily for BP 90 tablet 3   Cholecalciferol (VITAMIN D-3 PO) Take 10,000 Units by mouth daily.      Cinnamon 500 MG capsule Take 100 mg by mouth 2 (two) times daily.     fenofibrate (TRICOR) 145 MG tablet TAKE 1 TABLET BY MOUTH DAILY FOR TRIGLYCERIDES( BLOOD FATS) (Patient taking differently: daily. TAKE 1 TABLET BY MOUTH DAILY FOR TRIGLYCERIDES( BLOOD FATS)) 90 tablet 2   finasteride (PROSCAR) 5 MG tablet Take 1 tablet (5 mg total) by mouth daily. To prevent prostate bleeding and regrowth 90 tablet 3   fluticasone (FLONASE) 50 MCG/ACT nasal spray Use 1 to 2 sprays each Nostril 1 to 2 x /day 48 g 3   LORazepam (ATIVAN) 1 MG tablet Take 1 tablet 30 minutes prior to leaving house on day of office surgery.  Bring second tablet with you to the office. 2 tablet 0   Multiple Vitamin (MULTIVITAMIN) tablet  Take 1 tablet by mouth daily.     Omega-3 Fatty Acids (FISH OIL PO) Take 2,400 mg by mouth 2 (two) times daily.      zinc gluconate 50 MG tablet Take 50 mg by mouth daily.     oxyCODONE-acetaminophen (PERCOCET) 5-325 MG tablet Take 1 tablet by mouth every 6 (six) hours as needed for severe pain or moderate pain. 15 tablet 0   senna-docusate (SENOKOT-S) 8.6-50 MG tablet Take 1 tablet by mouth 2 (two) times daily. While taking strong pain meds to prevent constipation 10 tablet 0   triamcinolone ointment (KENALOG) 0.1 % Apply 1 application topically 2 (two) times daily. (Patient not taking: Reported on 07/06/2021) 80 g 1   No current facility-administered medications for this visit.    PHYSICAL EXAM: Vitals:   08/08/21 0840  BP: 137/87  Pulse: 77  Resp: 16  Temp: 98 F (36.7 C)  TempSrc: Temporal  SpO2: 99%  Weight: 207 lb (93.9 kg)  Height: 6\' 1"  (1.854 m)    PROCEDURE: Endovenous laser ablation left great saphenous vein with greater than 20 stabs  TECHNIQUE: The patient was taken to the exam room and the dilated varicose veins were marked with the patient standing.  The patient was then placed supine.  I looked at the left great saphenous vein myself with the SonoSite and I felt that I could cannulate this in the distal thigh.  The left leg was prepped and draped in usual sterile fashion.  Under ultrasound guidance, after the skin was anesthetized, I cannulated the great saphenous vein with a micropuncture needle and a micropuncture sheath was introduced over wire.  I then advanced the J-wire to just below the saphenofemoral junction.  I then advanced the dilator and sheath over the wire and positioned this just below the saphenofemoral junction.  The dilator and wire were removed.  I then positioned the laser fiber at the end of the sheath and the sheath was retracted.  The laser fiber was positioned 2.5 cm distal to the saphenofemoral junction.  Tumescent anesthesia was then administered  circumferentially around the vein.  The patient was placed in Trendelenburg.  We placed laser glasses on.  Laser ablation was performed of the left great saphenous vein from 2-1/2 cm distal to the saphenofemoral junction to the distal thigh.  50 J/cm was used at 107 W.  Attention was then turned to stab phlebectomies.  All the marked areas were anesthetized with tumescent anesthesia.  Using approximately 25 small stab incisions the vein was clipped brought above the skin grasped with a hemostat and then bluntly excised.  Pressure was held for hemostasis.  At the completion of the procedure Steri-Strips were applied and a pressure dressing was applied.  The patient tolerated the procedure well and will return in 1 week for a follow-up duplex.  Deitra Mayo [facility] [phone]

## 2021-08-15 ENCOUNTER — Other Ambulatory Visit: Payer: Self-pay

## 2021-08-15 ENCOUNTER — Encounter: Payer: Self-pay | Admitting: Vascular Surgery

## 2021-08-15 ENCOUNTER — Ambulatory Visit: Payer: BC Managed Care – PPO | Admitting: Vascular Surgery

## 2021-08-15 ENCOUNTER — Ambulatory Visit (HOSPITAL_COMMUNITY)
Admission: RE | Admit: 2021-08-15 | Discharge: 2021-08-15 | Disposition: A | Discharge status: Home / Self Care | Payer: BC Managed Care – PPO | Source: Ambulatory Visit | Attending: Vascular Surgery | Admitting: Vascular Surgery

## 2021-08-15 VITALS — BP 140/85 | HR 63 | Temp 98.1°F | Resp 18 | Ht 73.0 in | Wt 210.0 lb

## 2021-08-15 DIAGNOSIS — I83812 Varicose veins of left lower extremities with pain: Secondary | ICD-10-CM

## 2021-08-15 DIAGNOSIS — I872 Venous insufficiency (chronic) (peripheral): Secondary | ICD-10-CM

## 2021-08-15 NOTE — Progress Notes (Addendum)
Patient name: Tony Bailey MRN: [id number] DOB: 02-04-1961 Sex: male  REASON FOR VISIT: Follow-up after laser ablation left great saphenous vein with greater than 20 stabs  HPI: Tony Bailey is a 60 y.o. male who I have been following with chronic venous insufficiency.  He had CEAP C4c venous disease.  He had 4 bleeding episodes from his left leg and was at high risk for recurrent bleeds.  He failed conservative treatment.  I felt that he would benefit from laser ablation of the left great saphenous vein, greater than 20 stabs, and 2 units of sclerotherapy for the areas that were bleeding in the left foot.  On 08/08/2021 he underwent laser ablation of the left great saphenous vein with greater than 20 stabs.  Comes in for a 1 week follow-up visit.  I treated from 2-1/2 cm distal to the saphenofemoral junction to the distal thigh.  Today he has no specific complaints.  He said no further bleeding episodes.  He states that his left leg does feel better.  Current Outpatient Medications  Medication Sig Dispense Refill   atorvastatin (LIPITOR) 40 MG tablet TAKE 1 TABLET BY MOUTH DAILY FOR CHOLESTEROL (Patient taking differently: every evening. TAKE 1 TABLET BY MOUTH DAILY FOR CHOLESTEROL) 90 tablet 2   bisoprolol-hydrochlorothiazide (ZIAC) 5-6.25 MG tablet Take 1 tablet Daily for BP 90 tablet 3   Cholecalciferol (VITAMIN D-3 PO) Take 10,000 Units by mouth daily.      Cinnamon 500 MG capsule Take 100 mg by mouth 2 (two) times daily.     fenofibrate (TRICOR) 145 MG tablet TAKE 1 TABLET BY MOUTH DAILY FOR TRIGLYCERIDES( BLOOD FATS) (Patient taking differently: daily. TAKE 1 TABLET BY MOUTH DAILY FOR TRIGLYCERIDES( BLOOD FATS)) 90 tablet 2   finasteride (PROSCAR) 5 MG tablet Take 1 tablet (5 mg total) by mouth daily. To prevent prostate bleeding and regrowth 90 tablet 3   fluticasone (FLONASE) 50 MCG/ACT nasal spray Use 1 to 2 sprays each Nostril 1 to 2 x /day 48 g 3   LORazepam (ATIVAN) 1 MG  tablet Take 1 tablet 30 minutes prior to leaving house on day of office surgery.  Bring second tablet with you to the office. 2 tablet 0   Multiple Vitamin (MULTIVITAMIN) tablet Take 1 tablet by mouth daily.     Omega-3 Fatty Acids (FISH OIL PO) Take 2,400 mg by mouth 2 (two) times daily.      oxyCODONE-acetaminophen (PERCOCET) 5-325 MG tablet Take 1 tablet by mouth every 6 (six) hours as needed for severe pain or moderate pain. 15 tablet 0   senna-docusate (SENOKOT-S) 8.6-50 MG tablet Take 1 tablet by mouth 2 (two) times daily. While taking strong pain meds to prevent constipation 10 tablet 0   triamcinolone ointment (KENALOG) 0.1 % Apply 1 application topically 2 (two) times daily. (Patient not taking: Reported on 07/06/2021) 80 g 1   zinc gluconate 50 MG tablet Take 50 mg by mouth daily.     No current facility-administered medications for this visit.   REVIEW OF SYSTEMS: Valu.Nieves ] denotes positive finding; [  ] denotes negative finding  CARDIOVASCULAR:  [ ]  chest pain   [ ]  dyspnea on exertion  [ ]  leg swelling  CONSTITUTIONAL:  [ ]  fever   [ ]  chills  PHYSICAL EXAM: Vitals:   08/15/21 1036  BP: 140/85  Pulse: 63  Resp: 18  Temp: 98.1 F (36.7 C)  TempSrc: Temporal  SpO2: 99%  Weight: 210 lb (95.3 kg)  Height: 6\' 1"  (1.854 m)   GENERAL: The patient is a well-nourished male, in no acute distress. The vital signs are documented above. CARDIOVASCULAR: There is a regular rate and rhythm. PULMONARY: There is good air exchange bilaterally without wheezing or rales. VASCULAR: He has some mild bruising in the medial thigh and leg.  Steri-Strips are in place.  He has no significant leg swelling.  DATA:  VENOUS DUPLEX: I have independently interpreted his venous duplex scan today.  The left great saphenous vein is closed from the saphenofemoral junction (EHIT-1) to the knee.  There is no evidence of DVT.  MEDICAL ISSUES:  S/P LASER ABLATION LEFT GREAT SAPHENOUS VEIN/GREATER THAN 20 STABS's:  The patient is doing well status post laser ablation of the left great saphenous vein with greater than 20 stabs.  His wounds are healing nicely.  He will continue to wear his thigh-high compression stocking for another week.  Then I have explained that it may be more practical to wear a knee-high stocking.  I encouraged him to elevate his legs daily.  We have discussed importance of exercise and trying to avoid prolonged sitting and standing.  He is scheduled for sclerotherapy in the left leg where he had the bleeding issues in early December.  He does have CEAP C4c venous disease on the right side also.  We have not previously done formal venous reflux testing on the right and I will schedule him for a follow-up visit in 6 months with a formal study at that time.  He knows to call sooner if he has problems.  Deitra Mayo [facility] [place] [phone]

## 2021-08-16 ENCOUNTER — Other Ambulatory Visit: Payer: Self-pay

## 2021-08-16 DIAGNOSIS — I83812 Varicose veins of left lower extremities with pain: Secondary | ICD-10-CM

## 2021-09-02 ENCOUNTER — Other Ambulatory Visit: Payer: Self-pay | Admitting: Internal Medicine

## 2021-09-02 ENCOUNTER — Other Ambulatory Visit: Payer: Self-pay | Admitting: Adult Health

## 2021-09-02 DIAGNOSIS — I10 Essential (primary) hypertension: Secondary | ICD-10-CM

## 2021-09-14 ENCOUNTER — Ambulatory Visit (INDEPENDENT_AMBULATORY_CARE_PROVIDER_SITE_OTHER): Payer: BC Managed Care – PPO

## 2021-09-14 ENCOUNTER — Other Ambulatory Visit: Payer: Self-pay

## 2021-09-14 DIAGNOSIS — I83893 Varicose veins of bilateral lower extremities with other complications: Secondary | ICD-10-CM

## 2021-09-14 NOTE — Progress Notes (Signed)
Treated pt's bilateral lower leg small reticular veins and spider veins with Asclera 1% administered with a 27g butterfly.  Patient received a total of 4 cc. Treated areas of concern only that have either bled or were raised up and at risk for bleeding. Pt tolerated well. Easy access. Anticipate good results. Post procedure care instructions given on both handout and verbally. Will follow PRN.  Photos: Yes.    Compression stockings applied: Yes.

## 2021-09-20 ENCOUNTER — Encounter: Payer: Self-pay | Admitting: Internal Medicine

## 2021-09-26 ENCOUNTER — Other Ambulatory Visit: Payer: Self-pay

## 2021-09-26 ENCOUNTER — Encounter: Payer: Self-pay | Admitting: Vascular Surgery

## 2021-09-26 ENCOUNTER — Ambulatory Visit (INDEPENDENT_AMBULATORY_CARE_PROVIDER_SITE_OTHER): Payer: BC Managed Care – PPO | Admitting: Vascular Surgery

## 2021-09-26 DIAGNOSIS — I872 Venous insufficiency (chronic) (peripheral): Secondary | ICD-10-CM

## 2021-09-26 NOTE — Progress Notes (Signed)
Patient name: Tony Bailey MRN: [id number] DOB: 15-Jan-1961 Sex: male  REASON FOR VISIT:   Blood blister on right leg  HPI:   Tony Bailey is a pleasant 60 y.o. male who underwent laser ablation of the right great saphenous vein with stab phlebectomies.  He subsequently had sclerotherapy.  He developed a small blood blister on his right leg as documented in the photograph below.  He comes in today to have this checked out.  Current Outpatient Medications  Medication Sig Dispense Refill   atorvastatin (LIPITOR) 40 MG tablet TAKE 1 TABLET BY MOUTH DAILY FOR CHOLESTEROL (Patient taking differently: every evening. TAKE 1 TABLET BY MOUTH DAILY FOR CHOLESTEROL) 90 tablet 2   bisoprolol-hydrochlorothiazide (ZIAC) 5-6.25 MG tablet Take  1 tablet  Daily for BP                                            /                    TAKE 1 TABLET BY MOUTH 90 tablet 3   Cholecalciferol (VITAMIN D-3 PO) Take 10,000 Units by mouth daily.      Cinnamon 500 MG capsule Take 100 mg by mouth 2 (two) times daily.     fenofibrate (TRICOR) 145 MG tablet TAKE 1 TABLET BY MOUTH DAILY FOR TRIGLYCERIDES( BLOOD FATS) (Patient taking differently: daily. TAKE 1 TABLET BY MOUTH DAILY FOR TRIGLYCERIDES( BLOOD FATS)) 90 tablet 2   finasteride (PROSCAR) 5 MG tablet Take 1 tablet (5 mg total) by mouth daily. To prevent prostate bleeding and regrowth 90 tablet 3   fluticasone (FLONASE) 50 MCG/ACT nasal spray SHAKE LIQUID AND USE 1 TO 2 SPRAYS IN EACH NOSTRIL 1 TO 2 TIMES PER DAY 48 g 3   LORazepam (ATIVAN) 1 MG tablet Take 1 tablet 30 minutes prior to leaving house on day of office surgery.  Bring second tablet with you to the office. 2 tablet 0   Multiple Vitamin (MULTIVITAMIN) tablet Take 1 tablet by mouth daily.     Omega-3 Fatty Acids (FISH OIL PO) Take 2,400 mg by mouth 2 (two) times daily.      senna-docusate (SENOKOT-S) 8.6-50 MG tablet Take 1 tablet by mouth 2 (two) times daily. While taking strong pain meds to prevent  constipation 10 tablet 0   triamcinolone ointment (KENALOG) 0.1 % Apply 1 application topically 2 (two) times daily. (Patient not taking: Reported on 07/06/2021) 80 g 1   zinc gluconate 50 MG tablet Take 50 mg by mouth daily.     No current facility-administered medications for this visit.    REVIEW OF SYSTEMS:  [X]  denotes positive finding, [ ]  denotes negative finding Vascular    Leg swelling    Cardiac    Chest pain or chest pressure:    Shortness of breath upon exertion:    Short of breath when lying flat:    Irregular heart rhythm:    Constitutional    Fever or chills:     PHYSICAL EXAM:   There were no vitals filed for this visit.  GENERAL: The patient is a well-nourished male, in no acute distress. The vital signs are documented above.    DATA:   No new data  MEDICAL ISSUES:   CHRONIC VENOUS INSUFFICIENCY: This patient had a small blood blister on his right leg adjacent to  where he had some sclerotherapy.  I lanced this with an 18 blade after it was prepped with Betadine.  I applied some bacitracin and a Band-Aid.  I think this should heal without any problem.  I will see him back as needed.  Deitra Mayo [facility] [place] [phone]

## 2021-10-03 ENCOUNTER — Encounter: Payer: Self-pay | Admitting: Adult Health

## 2021-10-03 NOTE — Progress Notes (Signed)
FOLLOW UP  Assessment and Plan:   Atherosclerosis of aorta (HCC) - per CT 06/2021 Control blood pressure, cholesterol, glucose, increase exercise.   Hypertension At goal; continue meds Monitor blood pressure at home; patient to call if consistently greater than 130/80 Continue DASH diet.   Reminder to go to the ER if any CP, SOB, nausea, dizziness, severe HA, changes vision/speech, left arm numbness and tingling and jaw pain.  Cholesterol Dsicussed cardiovascular risks with aortic atherosclerosis;  He is interested in more aggressive therapy; will try switch to rosuvastatin 40 mg for LDL goal <70 fenofibrate 145 mg daily Continue low cholesterol diet and exercise.  Check lipid panel.   Abnormal glucose Continue diet and exercise.  Perform daily foot/skin check, notify office of any concerning changes.  Check A1C q52m; check CMP today   Overweight with co morbidities - fatty liver, prediabetes Long discussion about weight loss, diet, and exercise Recommended diet heavy in fruits and veggies and low in animal meats, cheeses, and dairy products, appropriate calorie intake Discussed ideal weight for height and initial weight goal (195 lb) Patient will work on increasing exercise- plan to start with PT at [facility], phone number given on discharge paperwork Will follow up in 3 months  Vitamin D Def At goal at last visit; continue supplementation to maintain goal of 60-100  Testosterone deficiency Denies symptoms; on zinc 50 mg daily supplement. Encouraged weight loss and HIIT exercise.   Fatty liver Risks reviewed Low processed carbohydrate diet, weight loss 5-7% of body weight discussed and goal of <195 lb set   Continue diet and meds as discussed. Further disposition pending results of labs. Discussed med's effects and SE's.   Over 30 minutes of exam, counseling, chart review, and critical decision making was performed.   Future Appointments  Date Time Provider  [place]  01/02/2022  9:30 AM [name], MD [facility] None  06/27/2022  9:00 AM Liane Comber, NP [facility] None    ----------------------------------------------------------------------------------------------------------------------  HPI 60 y.o. male  presents for 3 month follow up on hypertension, cholesterol, prediabetes, weight and vitamin D deficiency.   Earlier this year he underwent laser ablation of the right great saphenous vein with stab phlebectomies and later had sclerotherapy by Dr. Deitra Mayo.    He has OSA and is on CPAP and reports 100% compliance, reports restorative sleep, still using machine from 2006. He would like to follow up at some point, but declines for now in light of pandemic. [facility] does provide new tubing periodically.   He had renal stone study 06/27/2021 that showed large 10 mm R renal stone, also bil renal simple cysts, prostatomegaly - he was referred to [facility] urology, had cysto 07/13/2021, had TURP with laster/ureteral stent placement. Will possibly need repeat cysto due to unable to get full stone on the left.   Also incidentally showed aortic atherosclerosis and fatty liver, discussed risks and treatments for risk reduction.   BMI is Body mass index is 27.18 kg/m., he has been working on diet and exercise, admits irregular, reports weights are up and down at home.  Wt Readings from Last 3 Encounters:  10/04/21 206 lb (93.4 kg)  08/15/21 210 lb (95.3 kg)  08/08/21 207 lb (93.9 kg)   His blood pressure has been controlled at home on ziac 5 mg, today their BP is BP: 126/72  He does not workout. He denies chest pain, shortness of breath, dizziness.  He has aortic atherosclerosis per CT 06/27/2021.    He  is on cholesterol medication (atorvastatin 40 mg daily, fenofibrate 145 mg daily) and denies myalgias. His LDL cholesterol is at goal; trigs remained elevated at last check. The cholesterol last visit was:   Lab  Results  Component Value Date   CHOL 169 06/27/2021   HDL 51 06/27/2021   LDLCALC 95 06/27/2021   TRIG 126 06/27/2021   CHOLHDL 3.3 06/27/2021    He has not been working on diet and exercise for prediabetes, and denies foot ulcerations, hypoglycemia , increased appetite, nausea, paresthesia of the feet, polydipsia, polyuria, visual disturbances, vomiting and weight loss.  Last A1C in the office was:  Lab Results  Component Value Date   HGBA1C 5.7 (H) 06/27/2021   Patient is on Vitamin D supplement and at goal at last check:   Lab Results  Component Value Date   VD25OH 64 06/27/2021     He has a history of testosterone deficiency, on zinc 50 mg supplement. Denies notable symptoms of testosterone deficiency. Lab Results  Component Value Date   TESTOSTERONE 354 05/17/2020     Current Medications:  Current Outpatient Medications on File Prior to Visit  Medication Sig   aspirin EC 81 MG tablet Take 81 mg by mouth daily. Swallow whole.   bisoprolol-hydrochlorothiazide (ZIAC) 5-6.25 MG tablet Take  1 tablet  Daily for BP                                            /                    TAKE 1 TABLET BY MOUTH   Cholecalciferol (VITAMIN D-3 PO) Take 10,000 Units by mouth daily.    Cinnamon 500 MG capsule Take 100 mg by mouth 2 (two) times daily.   fenofibrate (TRICOR) 145 MG tablet TAKE 1 TABLET BY MOUTH DAILY FOR TRIGLYCERIDES( BLOOD FATS) (Patient taking differently: daily. TAKE 1 TABLET BY MOUTH DAILY FOR TRIGLYCERIDES( BLOOD FATS))   finasteride (PROSCAR) 5 MG tablet Take 1 tablet (5 mg total) by mouth daily. To prevent prostate bleeding and regrowth   fluticasone (FLONASE) 50 MCG/ACT nasal spray SHAKE LIQUID AND USE 1 TO 2 SPRAYS IN EACH NOSTRIL 1 TO 2 TIMES PER DAY   Multiple Vitamin (MULTIVITAMIN) tablet Take 1 tablet by mouth daily.   Omega-3 Fatty Acids (FISH OIL PO) Take 2,400 mg by mouth 2 (two) times daily.    zinc gluconate 50 MG tablet Take 50 mg by mouth daily.   No  current facility-administered medications on file prior to visit.     Allergies: No Known Allergies   Medical History:  Past Medical History:  Diagnosis Date   Anesthesia complication    Pt stated that years ago when he was having surgery for a perirectal abcess his heart stopped. He stated that he was told it was due to the position he was in and the anesthesia going to his heart. He said they were able to get him back quickly. Since then he has had surgeries with no complications.   Anxiety    Bleeding from varicose vein    Hyperlipidemia    Hypertension    Kidney stones    passed stones   Melanoma ([place]) 2009   upper back mid right side   OSA (obstructive sleep apnea)    Other testicular hypofunction    Prediabetes 05/09/2019  Sciatic nerve pain 2010   hx - no current problems per patient 07/06/20   Sleep apnea    uses cpap nightly   Testosterone deficiency 10/13/2013   Unspecified vitamin D deficiency    Venous insufficiency    bilateral   Family history- Reviewed and unchanged Social history- Reviewed and unchanged   Review of Systems:  Review of Systems  Constitutional:  Negative for malaise/fatigue and weight loss.  HENT:  Negative for hearing loss and tinnitus.   Eyes:  Negative for blurred vision and double vision.  Respiratory:  Negative for cough, shortness of breath and wheezing.   Cardiovascular:  Negative for chest pain, palpitations, orthopnea, claudication and leg swelling.  Gastrointestinal:  Negative for abdominal pain, blood in stool, constipation, diarrhea, heartburn, melena, nausea and vomiting.  Genitourinary: Negative.   Musculoskeletal:  Negative for joint pain and myalgias.  Skin:  Negative for rash.  Neurological:  Negative for dizziness, tingling, sensory change, weakness and headaches.  Endo/Heme/Allergies:  Negative for polydipsia.  Psychiatric/Behavioral: Negative.    All other systems reviewed and are negative.  Physical Exam: BP  126/72    Pulse 71    Temp (!) 97.5 F (36.4 C)    Wt 206 lb (93.4 kg)    SpO2 99%    BMI 27.18 kg/m  Wt Readings from Last 3 Encounters:  10/04/21 206 lb (93.4 kg)  08/15/21 210 lb (95.3 kg)  08/08/21 207 lb (93.9 kg)   General Appearance: Well nourished, in no apparent distress. Eyes: PERRLA, EOMs, conjunctiva no swelling or erythema Sinuses: No Frontal/maxillary tenderness ENT/Mouth: Ext aud canals clear, TMs without erythema, bulging. No erythema, swelling, or exudate on post pharynx.  Tonsils not swollen or erythematous. Hearing normal.  Neck: Supple, thyroid normal.  Respiratory: Respiratory effort normal, BS equal bilaterally without rales, rhonchi, wheezing or stridor.  Cardio: RRR with no MRGs. Brisk peripheral pulses without edema. Bil lowr legs and feet with varicose veins.  Abdomen: Soft, + BS.  Non tender, no guarding, rebound, hernias, masses. Lymphatics: Non tender without lymphadenopathy.  Musculoskeletal: Full ROM, 5/5 strength, Normal gait Skin: Warm, dry without rashes, lesions, ecchymosis.  Neuro: Cranial nerves intact. No cerebellar symptoms.  Psych: Awake and oriented X 3, normal affect, Insight and Judgment appropriate.    Izora Ribas, NP 9:55 AM [facility]

## 2021-10-04 ENCOUNTER — Ambulatory Visit: Payer: BC Managed Care – PPO | Admitting: Adult Health

## 2021-10-04 ENCOUNTER — Encounter: Payer: Self-pay | Admitting: Adult Health

## 2021-10-04 ENCOUNTER — Other Ambulatory Visit: Payer: Self-pay

## 2021-10-04 VITALS — BP 126/72 | HR 71 | Temp 97.5°F | Wt 206.0 lb

## 2021-10-04 DIAGNOSIS — Z79899 Other long term (current) drug therapy: Secondary | ICD-10-CM

## 2021-10-04 DIAGNOSIS — I700 Atherosclerosis of aorta: Secondary | ICD-10-CM | POA: Diagnosis not present

## 2021-10-04 DIAGNOSIS — R7309 Other abnormal glucose: Secondary | ICD-10-CM

## 2021-10-04 DIAGNOSIS — E663 Overweight: Secondary | ICD-10-CM

## 2021-10-04 DIAGNOSIS — I10 Essential (primary) hypertension: Secondary | ICD-10-CM | POA: Diagnosis not present

## 2021-10-04 DIAGNOSIS — Z9989 Dependence on other enabling machines and devices: Secondary | ICD-10-CM

## 2021-10-04 DIAGNOSIS — G4733 Obstructive sleep apnea (adult) (pediatric): Secondary | ICD-10-CM

## 2021-10-04 DIAGNOSIS — E782 Mixed hyperlipidemia: Secondary | ICD-10-CM

## 2021-10-04 DIAGNOSIS — E559 Vitamin D deficiency, unspecified: Secondary | ICD-10-CM

## 2021-10-04 MED ORDER — ROSUVASTATIN CALCIUM 40 MG PO TABS: 40.0000 mg | ORAL_TABLET | Freq: Every day | ORAL | 3 refills | Status: DC

## 2021-10-05 LAB — CBC WITH DIFFERENTIAL/PLATELET
Absolute Monocytes: 556 cells/uL (ref 200–950)
Basophils Absolute: 83 cells/uL (ref 0–200)
Basophils Relative: 1.5 %
Eosinophils Absolute: 242 cells/uL (ref 15–500)
Eosinophils Relative: 4.4 %
HCT: 44.3 % (ref 38.5–50.0)
Hemoglobin: 14.6 g/dL (ref 13.2–17.1)
Lymphs Abs: 1513 cells/uL (ref 850–3900)
MCH: 30.4 pg (ref 27.0–33.0)
MCHC: 33 g/dL (ref 32.0–36.0)
MCV: 92.1 fL (ref 80.0–100.0)
MPV: 9.7 fL (ref 7.5–12.5)
Monocytes Relative: 10.1 %
Neutro Abs: 3108 cells/uL (ref 1500–7800)
Neutrophils Relative %: 56.5 %
Platelets: 356 10*3/uL (ref 140–400)
RBC: 4.81 10*6/uL (ref 4.20–5.80)
RDW: 11.8 % (ref 11.0–15.0)
Total Lymphocyte: 27.5 %
WBC: 5.5 10*3/uL (ref 3.8–10.8)

## 2021-10-05 LAB — MAGNESIUM: Magnesium: 2 mg/dL (ref 1.5–2.5)

## 2021-10-05 LAB — COMPLETE METABOLIC PANEL WITH GFR
AG Ratio: 1.8 (calc) (ref 1.0–2.5)
ALT: 23 U/L (ref 9–46)
AST: 23 U/L (ref 10–35)
Albumin: 4.5 g/dL (ref 3.6–5.1)
Alkaline phosphatase (APISO): 44 U/L (ref 35–144)
BUN: 23 mg/dL (ref 7–25)
CO2: 28 mmol/L (ref 20–32)
Calcium: 9.9 mg/dL (ref 8.6–10.3)
Chloride: 103 mmol/L (ref 98–110)
Creat: 1.05 mg/dL (ref 0.70–1.35)
Globulin: 2.5 g/dL (calc) (ref 1.9–3.7)
Glucose, Bld: 96 mg/dL (ref 65–99)
Potassium: 4.5 mmol/L (ref 3.5–5.3)
Sodium: 142 mmol/L (ref 135–146)
Total Bilirubin: 0.6 mg/dL (ref 0.2–1.2)
Total Protein: 7 g/dL (ref 6.1–8.1)
eGFR: 80 mL/min/{1.73_m2} (ref >=60)

## 2021-10-05 LAB — LIPID PANEL
Cholesterol: 178 mg/dL (ref <200)
HDL: 62 mg/dL (ref >=40)
LDL Cholesterol (Calc): 97 mg/dL (calc)
Non-HDL Cholesterol (Calc): 116 mg/dL (calc) (ref <130)
Total CHOL/HDL Ratio: 2.9 (calc) (ref <5.0)
Triglycerides: 101 mg/dL (ref <150)

## 2021-10-05 LAB — TSH: TSH: 2.59 mIU/L (ref 0.40–4.50)

## 2021-11-02 ENCOUNTER — Encounter: Payer: Self-pay | Admitting: Internal Medicine

## 2021-11-16 ENCOUNTER — Other Ambulatory Visit: Payer: Self-pay | Admitting: Adult Health Nurse Practitioner

## 2021-11-16 DIAGNOSIS — E782 Mixed hyperlipidemia: Secondary | ICD-10-CM

## 2022-01-02 ENCOUNTER — Other Ambulatory Visit: Payer: Self-pay

## 2022-01-02 ENCOUNTER — Encounter: Payer: Self-pay | Admitting: Internal Medicine

## 2022-01-02 ENCOUNTER — Ambulatory Visit: Payer: BC Managed Care – PPO | Admitting: Internal Medicine

## 2022-01-02 VITALS — BP 132/78 | HR 74 | Temp 97.9°F | Resp 16 | Ht 73.0 in | Wt 208.4 lb

## 2022-01-02 DIAGNOSIS — E782 Mixed hyperlipidemia: Secondary | ICD-10-CM

## 2022-01-02 DIAGNOSIS — I700 Atherosclerosis of aorta: Secondary | ICD-10-CM

## 2022-01-02 DIAGNOSIS — I10 Essential (primary) hypertension: Secondary | ICD-10-CM

## 2022-01-02 DIAGNOSIS — E559 Vitamin D deficiency, unspecified: Secondary | ICD-10-CM

## 2022-01-02 DIAGNOSIS — Z79899 Other long term (current) drug therapy: Secondary | ICD-10-CM

## 2022-01-02 DIAGNOSIS — R7309 Other abnormal glucose: Secondary | ICD-10-CM

## 2022-01-02 NOTE — Progress Notes (Signed)
? ?Future Appointments  ?Date Time Provider Department  ?01/02/2022  9:30 AM [name], MD [facility]  ?06/27/2022  9:00 AM Liane Comber, NP [facility]  ? ? ?History of Present Illness: ? ? ?    This very nice 61 y.o. MWM  presents for 3 month follow up with HTN, HLD, Pre-Diabetes and Vitamin D Deficiency. Patient has OSA on CPAP with improved restorative sleep. In Sept 2022, CT scan showed Aortic Atherosclerosis.  ? ? ?    Patient is treated for HTN circa 2014  & BP has been controlled and today?s BP is at goal - 132/78. Patient has had no complaints of any cardiac type chest pain, palpitations, dyspnea / orthopnea / PND, dizziness, claudication, or dependent edema. ? ? ?    Hyperlipidemia is controlled with diet & Rosuvastatin Venia Minks. Patient denies myalgias or other med SE?s. Last Lipids were at goal : ? ?Lab Results  ?Component Value Date  ? CHOL 178 10/04/2021  ? HDL 62 10/04/2021  ? [place] 97 10/04/2021  ? TRIG 101 10/04/2021  ? CHOLHDL 2.9 10/04/2021  ? ? ? ?Also, the patient has history of PreDiabetes  (A1c 5.8% /2014 & 5.9% /2020) and has had no symptoms of reactive hypoglycemia, diabetic polys, paresthesias or visual blurring.  Last A1c was  near goal : ? ?Lab Results  ?Component Value Date  ? HGBA1C 5.7 (H) 06/27/2021  ? ?    ? ?                                                  Further, the patient also has history of Vitamin D Deficiency ("47" /2014) and supplements vitamin D without any suspected side-effects. Last vitamin D was at goal : ? ? ?Lab Results  ?Component Value Date  ? VD25OH 64 06/27/2021  ? ? ? ?Current Outpatient Medications on File Prior to Visit  ?Medication Sig  ? aspirin EC 81 MG tablet Take daily  ? bisoprolol-hctz 5-6.25 MG tablet Take  1 tablet  Daily   ? VITAMIN D 10,000 Units Take  daily.   ? Cinnamon 500 MG capsule Take 1000 mg 2  times daily.  ? fenofibrate 145 MG tablet TAKE 1 TABLET  DAILY   ? finasteride 5 MG tablet Take 1 tablet daily  ? FLONASE nasal spray  USE 1 TO 2 SPRAYS  1 TO 2 TIMES PER DAY  ? Multiple Vitamin  Take 1 tablet bdaily.  ? Omega-3 FISH OIL Take 2,400 mg 2  times daily.   ? rosuvastatin 40 MG tablet Take 1 tablet  daily.  ? zinc  50 MG tablet Take daily.  ? ? ? ?No Known Allergies ? ? ?PMHx:   ?Past Medical History:  ?Diagnosis Date  ? Anesthesia complication   ? Pt stated that years ago when he was having surgery for a perirectal abcess his heart stopped. He stated that he was told it was due to the position he was in and the anesthesia going to his heart. He said they were able to get him back quickly. Since then he has had surgeries with no complications.  ? Anxiety   ? Bleeding from varicose vein   ? Hyperlipidemia   ? Hypertension   ? Kidney stones   ? passed stones  ? Melanoma ([place]) 2009  ? upper back mid right  side  ? OSA (obstructive sleep apnea)   ? Other testicular hypofunction   ? Prediabetes 05/09/2019  ? Sciatic nerve pain 2010  ? hx - no current problems per patient 07/06/20  ? Sleep apnea   ? uses cpap nightly  ? Testosterone deficiency 10/13/2013  ? vitamin D deficiency   ? Venous insufficiency   ? bilateral  ? ? ? ?Immunization History  ?Administered Date(s) Administered  ? Influenza Inj Mdck Quad  08/10/2020  ? Influenza Inj Mdck Quad  09/03/2017, 07/28/2018  ? Influenza Split 08/23/2014  ? Influenza  08/09/2013  ? Influenza, Seasonal 07/30/2016  ? Influenza 08/01/2019, 08/10/2020, 08/23/2021  ? PFIZER SARS-COV-2 Vacc 12/30/2019, 01/20/2020, 07/31/2020, 03/30/2021, 10/01/2021  ? PPD Test 08/23/2014, 10/18/2015, 01/29/2017, 04/09/2018, 05/10/2019, 05/17/2020  ? Pneumococcal -13 08/23/2014  ? Tdap 01/17/2016  ? Zoster Recombinat (Shingrix) 09/12/2019, 11/20/2019  ? ? ? ?Past Surgical History:  ?Procedure Laterality Date  ? COLONOSCOPY  04/13/2010  ? [facility]   ? CYSTOSCOPY/URETEROSCOPY/HOLMIUM LASER/STENT PLACEMENT Left 07/13/2021  ? CYSTOSCOPY BILATERAL RETROGRADE PYELOGRAM/CYSTOLITHALOPAXY; Alexis Frock, MD  ? ENDOVENOUS  ABLATION SAPHENOUS VEIN W/ LASER Left 08/08/2021  ? endovenous laser ablation left greater saphenous veins and stab phlebectomy > 20 incisions left leg by Gae Gallop MD  ? HERNIA REPAIR Left groin  ? 1970  ? HERNIA REPAIR Right groin  ? 2011  ? INCISION AND DRAINAGE PERIRECTAL ABSCESS    ? x 2  ? MELANOMA EXCISION  2009  ? melanona  2009  ? upper back -mid right side  ? NASAL SEPTUM SURGERY    ? SHOULDER ARTHROSCOPY Left 2003  ? TISSUE GRAFT    ? hx of gum grafts  ? TRANSURETHRAL RESECTION OF PROSTATE  07/13/2021  ? TRANSURETHRAL RESECTION OF THE PROSTATE Tommie Sams, MD  ? [place]  ? ? ?FHx:    Reviewed / unchanged ? ?SHx:    Reviewed / unchanged  ? ?Systems Review: ? ?Constitutional: Denies fever, chills, wt changes, headaches, insomnia, fatigue, night sweats, change in appetite. ?Eyes: Denies redness, blurred vision, diplopia, discharge, itchy, watery eyes.  ?ENT: Denies discharge, congestion, post nasal drip, epistaxis, sore throat, earache, hearing loss, dental pain, tinnitus, vertigo, sinus pain, snoring.  ?CV: Denies chest pain, palpitations, irregular heartbeat, syncope, dyspnea, diaphoresis, orthopnea, PND, claudication or edema. ?Respiratory: denies cough, dyspnea, DOE, pleurisy, hoarseness, laryngitis, wheezing.  ?Gastrointestinal: Denies dysphagia, odynophagia, heartburn, reflux, water brash, abdominal pain or cramps, nausea, vomiting, bloating, diarrhea, constipation, hematemesis, melena, hematochezia  or hemorrhoids. ?Genitourinary: Denies dysuria, frequency, urgency, nocturia, hesitancy, discharge, hematuria or flank pain. ?Musculoskeletal: Denies arthralgias, myalgias, stiffness, jt. swelling, pain, limping or strain/sprain.  ?Skin: Denies pruritus, rash, hives, warts, acne, eczema or change in skin lesion(s). ?Neuro: No weakness, tremor, incoordination, spasms, paresthesia or pain. ?Psychiatric: Denies confusion, memory loss or sensory loss. ?Endo: Denies change in  weight, skin or hair change.  ?Heme/Lymph: No excessive bleeding, bruising or enlarged lymph nodes. ? ?Physical Exam ? ?BP 132/78   Pulse 74   Temp 97.9 ?F (36.6 ?C)   Resp 16   Ht '6\' 1"'$  (1.854 m)   Wt 208 lb 6.4 oz (94.5 kg)   SpO2 96%   BMI 27.50 kg/m?  ? ?Appears  well nourished, well groomed  and in no distress. ? ?Eyes: PERRLA, EOMs, conjunctiva no swelling or erythema. ?Sinuses: No frontal/maxillary tenderness ?ENT/Mouth: EAC's clear, TM's nl w/o erythema, bulging. Nares clear w/o erythema, swelling, exudates. Oropharynx clear without erythema or exudates. Oral hygiene  is good. Tongue normal, non obstructing. Hearing intact.  ?Neck: Supple. Thyroid not palpable. Car 2+/2+ without bruits, nodes or JVD. ?Chest: Respirations nl with BS clear & equal w/o rales, rhonchi, wheezing or stridor.  ?Cor: Heart sounds normal w/ regular rate and rhythm without sig. murmurs, gallops, clicks or rubs. Peripheral pulses normal and equal  without edema.  ?Abdomen: Soft & bowel sounds normal. Non-tender w/o guarding, rebound, hernias, masses or organomegaly.  ?Lymphatics: Unremarkable.  ?Musculoskeletal: Full ROM all peripheral extremities, joint stability, 5/5 strength and normal gait.  ?Skin: Warm, dry without exposed rashes, lesions or ecchymosis apparent.  ?Neuro: Cranial nerves intact, reflexes equal bilaterally. Sensory-motor testing grossly intact. Tendon reflexes grossly intact.  ?Pysch: Alert & oriented x 3.  Insight and judgement nl & appropriate. No ideations. ? ?Assessment and Plan: ? ?1. Essential hypertension ? ?- Continue medication, monitor blood pressure at home.  ?- Continue DASH diet.  Reminder to go to the ER if any CP,  ?SOB, nausea, dizziness, severe HA, changes vision/speech. ?  ?- CBC with Differential/Platelet ?- COMPLETE METABOLIC PANEL WITH GFR ?- Magnesium ?- TSH ? ?2. Hyperlipidemia, mixed ? ?- Continue diet/meds, exercise,& lifestyle modifications.  ?- Continue monitor periodic  cholesterol/liver & renal functions  ?  ?- Lipid panel ?- TSH ? ?3. Abnormal glucose (prediabetes) ? ?- Continue diet, exercise  ?- Lifestyle modifications.  ?- Monitor appropriate labs ?  ?- Hemoglobin A1c ?- Insulin, random

## 2022-01-02 NOTE — Patient Instructions (Signed)

## 2022-01-03 LAB — CBC WITH DIFFERENTIAL/PLATELET
Absolute Monocytes: 468 cells/uL (ref 200–950)
Basophils Absolute: 78 cells/uL (ref 0–200)
Basophils Relative: 1.5 %
Eosinophils Absolute: 130 cells/uL (ref 15–500)
Eosinophils Relative: 2.5 %
HCT: 43.4 % (ref 38.5–50.0)
Hemoglobin: 14.5 g/dL (ref 13.2–17.1)
Lymphs Abs: 1482 cells/uL (ref 850–3900)
MCH: 30.3 pg (ref 27.0–33.0)
MCHC: 33.4 g/dL (ref 32.0–36.0)
MCV: 90.8 fL (ref 80.0–100.0)
MPV: 9.5 fL (ref 7.5–12.5)
Monocytes Relative: 9 %
Neutro Abs: 3042 cells/uL (ref 1500–7800)
Neutrophils Relative %: 58.5 %
Platelets: 318 10*3/uL (ref 140–400)
RBC: 4.78 10*6/uL (ref 4.20–5.80)
RDW: 11.5 % (ref 11.0–15.0)
Total Lymphocyte: 28.5 %
WBC: 5.2 10*3/uL (ref 3.8–10.8)

## 2022-01-03 LAB — COMPLETE METABOLIC PANEL WITH GFR
AG Ratio: 1.9 (calc) (ref 1.0–2.5)
ALT: 22 U/L (ref 9–46)
AST: 23 U/L (ref 10–35)
Albumin: 4.6 g/dL (ref 3.6–5.1)
Alkaline phosphatase (APISO): 36 U/L (ref 35–144)
BUN: 22 mg/dL (ref 7–25)
CO2: 33 mmol/L — ABNORMAL HIGH (ref 20–32)
Calcium: 10.4 mg/dL — ABNORMAL HIGH (ref 8.6–10.3)
Chloride: 104 mmol/L (ref 98–110)
Creat: 1.15 mg/dL (ref 0.70–1.35)
Globulin: 2.4 g/dL (calc) (ref 1.9–3.7)
Glucose, Bld: 98 mg/dL (ref 65–99)
Potassium: 4.6 mmol/L (ref 3.5–5.3)
Sodium: 144 mmol/L (ref 135–146)
Total Bilirubin: 0.6 mg/dL (ref 0.2–1.2)
Total Protein: 7 g/dL (ref 6.1–8.1)
eGFR: 72 mL/min/{1.73_m2} (ref >=60)

## 2022-01-03 LAB — TSH: TSH: 2.41 mIU/L (ref 0.40–4.50)

## 2022-01-03 LAB — LIPID PANEL
Cholesterol: 154 mg/dL (ref <200)
HDL: 54 mg/dL (ref >=40)
LDL Cholesterol (Calc): 80 mg/dL (calc)
Non-HDL Cholesterol (Calc): 100 mg/dL (calc) (ref <130)
Total CHOL/HDL Ratio: 2.9 (calc) (ref <5.0)
Triglycerides: 104 mg/dL (ref <150)

## 2022-01-03 LAB — VITAMIN D 25 HYDROXY (VIT D DEFICIENCY, FRACTURES): Vit D, 25-Hydroxy: 58 ng/mL (ref 30–100)

## 2022-01-03 LAB — MAGNESIUM: Magnesium: 2 mg/dL (ref 1.5–2.5)

## 2022-01-03 LAB — HEMOGLOBIN A1C
Hgb A1c MFr Bld: 5.8 % of total Hgb — ABNORMAL HIGH (ref <5.7)
Mean Plasma Glucose: 120 mg/dL
eAG (mmol/L): 6.6 mmol/L

## 2022-01-03 LAB — INSULIN, RANDOM: Insulin: 10.7 u[IU]/mL

## 2022-01-03 NOTE — Progress Notes (Signed)
<><><><><><><><><><><><><><><><><><><><><><><><><><><><><><><><><> ?<><><><><><><><><><><><><><><><><><><><><><><><><><><><><><><><><> ?- Test results slightly outside the reference range are not unusual. ?If there is anything important, I will review this with you,  ?otherwise it is considered normal test values.  ?If you have further questions,  ?please do not hesitate to contact me at the office or via My Chart.  ?<><><><><><><><><><><><><><><><><><><><><><><><><><><><><><><><><> ?<><><><><><><><><><><><><><><><><><><><><><><><><><><><><><><><><> ? ?-  A1c = 5.8%  Blood sugar and A1c are STILL elevated in the borderline and  ?early or pre-diabetes range which has the same  ? ?300% increased risk for  ? ?heart attack, stroke, cancer and alzheimer- type vascular dementia  ?                                                                                         as full blown diabetes.  ? ?But the good news is that diet, exercise with weight loss can  ? ?                                                                   cure the early diabetes at this point. ?<><><><><><><><><><><><><><><><><><><><><><><><><><><><><><><><><> ? ?-  Total Chol = 158  -  ?              (  Ideal or Goal is less than 180  !  )  ? ?- and Bad  /Dangerous LDL Chol = 80  ? ?                                                               - Both  Excellent  ! ? ?- Very low risk for Heart Attack  / Stroke ?<><><><><><><><><><><><><><><><><><><><><><><><><><><><><><><><><> ?<><><><><><><><><><><><><><><><><><><><><><><><><><><><><><><><><> ? ?-  Vitamin D = 58  - Borderline Low  ? ?- Vitamin D goal is between 70-100.  ? ?- Please make sure that you are taking your Vitamin D as directed.  ? ?- It is very important as a natural anti-inflammatory and helping the  ?immune system protect against viral infections, like the Covid-19  ? ? ?helping hair, skin, and nails, as well as reducing stroke and  ?heart attack risk.  ? ?- It helps your bones and  helps with mood. ? ?- It also decreases numerous cancer risks so please  ?take it as directed.  ? ?- Low Vit D is associated with a 200-300% higher risk for  ?CANCER  ? ?and 200-300% higher risk for HEART   ATTACK  &  STROKE.   ? ?- It is also associated with higher death rate at younger ages,  ? ?autoimmune diseases like Rheumatoid arthritis, Lupus,  ?Multiple Sclerosis.    ? ?- Also many other serious conditions, like depression, Alzheimer's ? ?Dementia, infertility, muscle aches, fatigue, fibromyalgia  ? ?-  just to name a few. ?<><><><><><><><><><><><><><><><><><><><><><><><><><><><><><><><><> ?<><><><><><><><><><><><><><><><><><><><><><><><><><><><><><><><><> ? ?-  All Else - CBC - Kidneys - Electrolytes - Liver - Magnesium & Thyroid   ? ?- all  Normal / OK ?<><><><><><><><><><><><><><><><><><><><><><><><><><><><><><><><><> ?<><><><><><><><><><><><><><><><><><><><><><><><><><><><><><><><><> ? ?-  Keep up the [place] Work  ! ? ?<><><><><><><><><><><><><><><><><><><><><><><><><><><><><><><><><> ?<><><><><><><><><><><><><><><><><><><><><><><><><><><><><><><><><> ? ? ? ? ? ? ? ? ? ? ? ? ? ? ? ? ? ? ? ? ? ? ? ? ? ? ? ? ? ? ? ? ? ? ? ? ? ? ? ? ? ? ? ? ? ? ? ?

## 2022-01-08 ENCOUNTER — Encounter: Payer: Self-pay | Admitting: Internal Medicine

## 2022-01-09 ENCOUNTER — Other Ambulatory Visit: Payer: Self-pay | Admitting: Internal Medicine

## 2022-01-09 DIAGNOSIS — B351 Tinea unguium: Secondary | ICD-10-CM

## 2022-01-09 MED ORDER — TERBINAFINE HCL 250 MG PO TABS: ORAL_TABLET | ORAL | 1 refills | Status: DC

## 2022-02-09 IMAGING — CR DG CHEST 2V
2 series · 2 of 2 positions shown · non-contrast
Comparison: None.

CLINICAL DATA: Hypertension.

EXAM:
CHEST - 2 VIEW

[w chest pa]
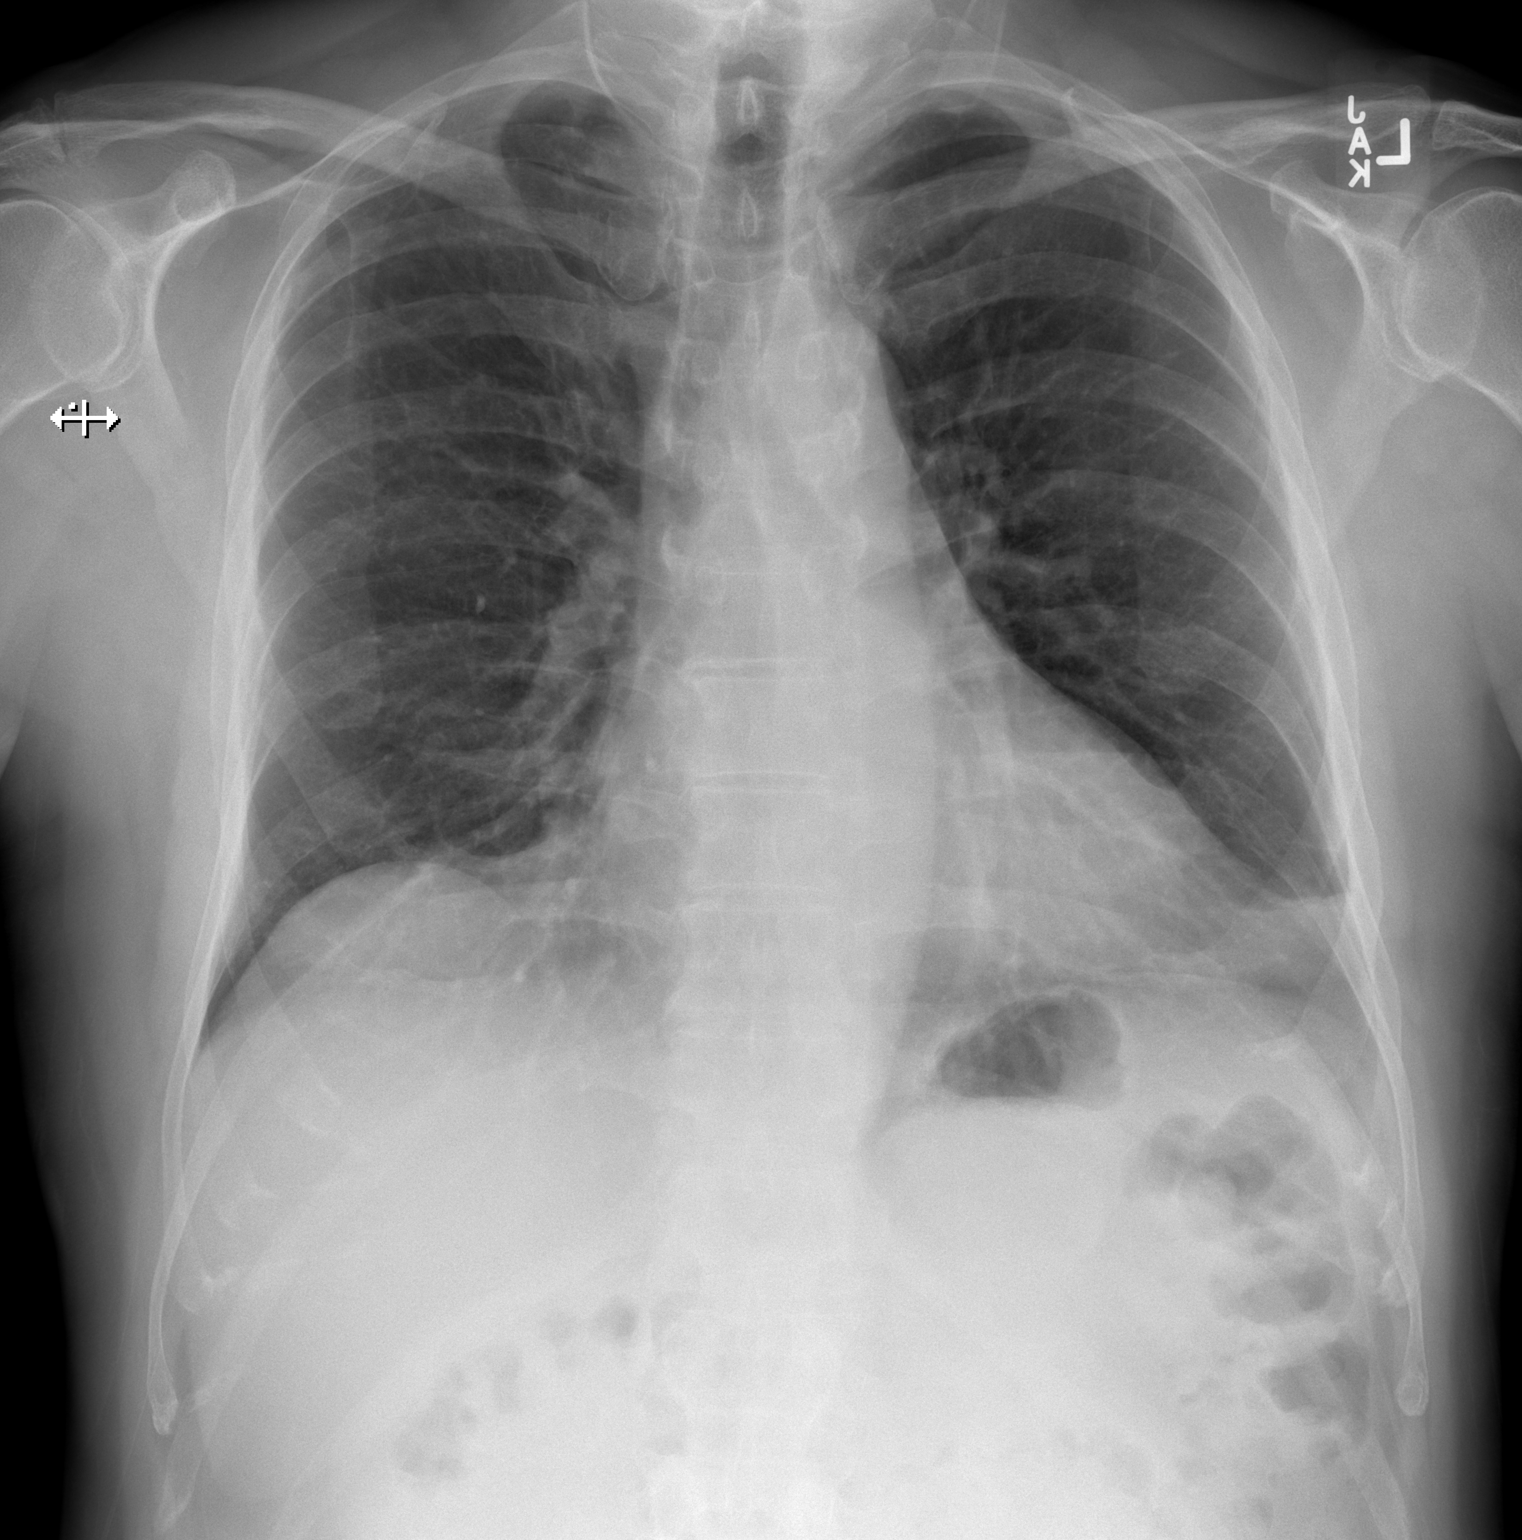

[w chest lat]
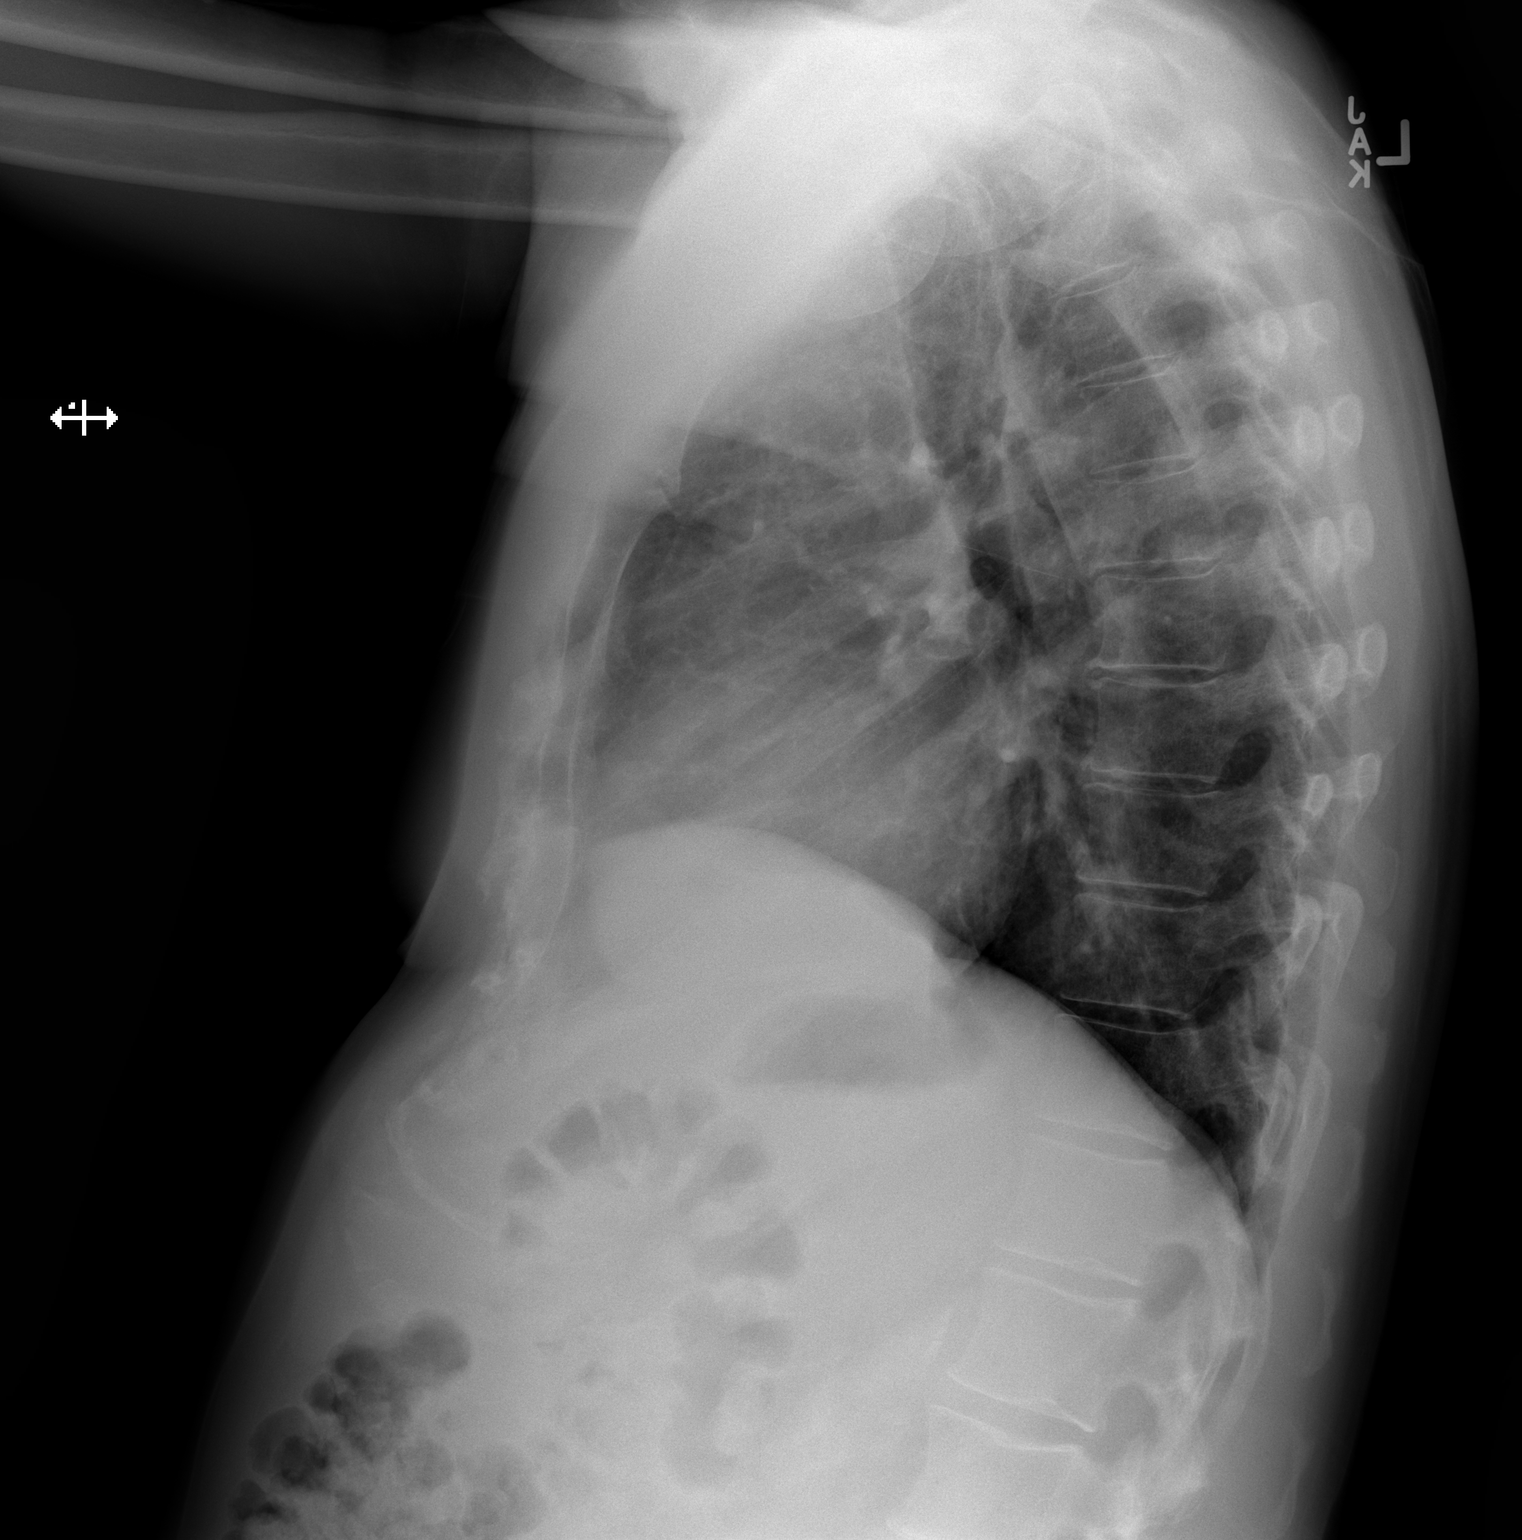

[2 of 2 positions shown; findings below may reference images not displayed]

FINDINGS: There is no evidence of acute infiltrate, pleural effusion or
pneumothorax. The heart size and mediastinal contours are within
normal limits. A prominent fat pad is seen along lateral aspect of
the left ventricle. The visualized skeletal structures are
unremarkable.
IMPRESSION: No active cardiopulmonary disease.

## 2022-02-20 ENCOUNTER — Ambulatory Visit: Payer: BC Managed Care – PPO | Admitting: Vascular Surgery

## 2022-02-20 ENCOUNTER — Encounter: Payer: Self-pay | Admitting: Vascular Surgery

## 2022-02-20 ENCOUNTER — Ambulatory Visit (HOSPITAL_COMMUNITY)
Admission: RE | Admit: 2022-02-20 | Discharge: 2022-02-20 | Disposition: A | Discharge status: Home / Self Care | Payer: BC Managed Care – PPO | Source: Ambulatory Visit | Attending: Vascular Surgery | Admitting: Vascular Surgery

## 2022-02-20 VITALS — BP 158/83 | HR 58 | Temp 98.3°F | Resp 18 | Ht 74.0 in | Wt 208.8 lb

## 2022-02-20 DIAGNOSIS — I83812 Varicose veins of left lower extremities with pain: Secondary | ICD-10-CM | POA: Insufficient documentation

## 2022-02-20 DIAGNOSIS — I83811 Varicose veins of right lower extremities with pain: Secondary | ICD-10-CM | POA: Diagnosis not present

## 2022-02-20 DIAGNOSIS — I872 Venous insufficiency (chronic) (peripheral): Secondary | ICD-10-CM

## 2022-02-20 NOTE — Progress Notes (Signed)
? ? ?REASON FOR VISIT:  ? ?Follow-up of chronic venous insufficiency ? ?MEDICAL ISSUES:  ? ?CHRONIC VENOUS INSUFFICIENCY: This patient has CEAP C4c venous disease on the right.  He had a good result from laser ablation of the great saphenous vein on the left side.  He is now having significant symptoms on the right and is failed conservative treatment including leg elevation, thigh-high compression stockings, and exercise.  I think he would be a good candidate for laser ablation of the right great saphenous vein.  We would also try to do a few stabs to address the varicosities along his anterior right leg.  I did explain to him that I am somewhat concerned about addressing this over the shin as the skin is more thin there and there is some small risk of wound healing issues or significant bruising. ? ?I have discussed the indications for endovenous laser ablation of the right GSV, that is to lower the pressure in the veins and potentially help relieve the symptoms from venous hypertension.  I have also discussed alternative options such as conservative treatment as described above. I have discussed the potential complications of the procedure, including bleeding, bruising, leg swelling, deep venous thrombosis (<1% risk), or failure of the vein to close <1% risk).  I have also explained that venous insufficiency is a chronic disease, and that the patient is at risk for recurrent varicose veins in the future.  All of the patient's questions were encouraged and answered. They are agreeable to proceed.  ? ?I have discussed with the patient the indications for stab phlebectomy.  I have explained to the patient that that will have small scars from the stab incisions.  I explained that the other risks include bruising, bleeding, and phlebitis.  ? ? ?HPI:  ? ?Tony Bailey is a pleasant 61 y.o. male who previously underwent laser ablation of the left great saphenous vein and greater than 20 stab phlebectomies on  08/08/2021 for chronic venous insufficiency which was not responsive to conservative treatment.  He has CEAP C4c venous disease. ? ?He comes in for follow-up visit.  Since I saw him last, he states that his symptoms in the left leg have improved.  He notices less swelling and less pain with standing.  He does continue to have pain and swelling in the right leg which is aggravated by standing and relieved with elevation.  He has been wearing his compression stockings.  He has had no bleeding episodes from the varicosities in his right leg. ? ?There have been no significant changes to his medical history. ? ?Past Medical History:  ?Diagnosis Date  ? Anesthesia complication   ? Pt stated that years ago when he was having surgery for a perirectal abcess his heart stopped. He stated that he was told it was due to the position he was in and the anesthesia going to his heart. He said they were able to get him back quickly. Since then he has had surgeries with no complications.  ? Anxiety   ? Bleeding from varicose vein   ? Hyperlipidemia   ? Hypertension   ? Kidney stones   ? passed stones  ? Melanoma ([place]) 2009  ? upper back mid right side  ? OSA (obstructive sleep apnea)   ? Other testicular hypofunction   ? Prediabetes 05/09/2019  ? Sciatic nerve pain 2010  ? hx - no current problems per patient 07/06/20  ? Sleep apnea   ? uses cpap nightly  ?  Testosterone deficiency 10/13/2013  ? Unspecified vitamin D deficiency   ? Venous insufficiency   ? bilateral  ? ? ?Family History  ?Problem Relation Age of Onset  ? Aneurysm Mother 16  ? AAA (abdominal aortic aneurysm) Father   ? Kidney disease Father   ? Kidney failure Father   ? Prostate cancer Father   ? CAD Father   ? AAA (abdominal aortic aneurysm) Paternal Aunt   ? CAD Paternal Aunt   ? Heart attack Paternal Uncle   ?     Several paternal uncles with CAD hx  ? Colon cancer Neg Hx   ? Stomach cancer Neg Hx   ? Rectal cancer Neg Hx   ? ? ?SOCIAL HISTORY: ?Social History   ? ?Tobacco Use  ? Smoking status: Never  ? Smokeless tobacco: Never  ?Substance Use Topics  ? Alcohol use: No  ? ? ?No Known Allergies ? ?Current Outpatient Medications  ?Medication Sig Dispense Refill  ? aspirin EC 81 MG tablet Take 81 mg by mouth daily. Swallow whole.    ? bisoprolol-hydrochlorothiazide (ZIAC) 5-6.25 MG tablet Take  1 tablet  Daily for BP                                            /                    TAKE 1 TABLET BY MOUTH 90 tablet 3  ? Cholecalciferol (VITAMIN D-3 PO) Take 10,000 Units by mouth daily.     ? Cinnamon 500 MG capsule Take 100 mg by mouth 2 (two) times daily.    ? fenofibrate (TRICOR) 145 MG tablet TAKE 1 TABLET BY MOUTH DAILY FOR TRIGLYCERIDES 90 tablet 2  ? finasteride (PROSCAR) 5 MG tablet Take 1 tablet (5 mg total) by mouth daily. To prevent prostate bleeding and regrowth 90 tablet 3  ? fluticasone (FLONASE) 50 MCG/ACT nasal spray SHAKE LIQUID AND USE 1 TO 2 SPRAYS IN EACH NOSTRIL 1 TO 2 TIMES PER DAY 48 g 3  ? Multiple Vitamin (MULTIVITAMIN) tablet Take 1 tablet by mouth daily.    ? Omega-3 Fatty Acids (FISH OIL PO) Take 2,400 mg by mouth 2 (two) times daily.     ? rosuvastatin (CRESTOR) 40 MG tablet Take 1 tablet (40 mg total) by mouth daily. 90 tablet 3  ? terbinafine (LAMISIL) 250 MG tablet Take  1 tablet  Daily  for Nail Fungus 90 tablet 1  ? zinc gluconate 50 MG tablet Take 50 mg by mouth daily.    ? ?No current facility-administered medications for this visit.  ? ? ?REVIEW OF SYSTEMS:  ?'[X]'$  denotes positive finding, '[ ]'$  denotes negative finding ?Cardiac  Comments:  ?Chest pain or chest pressure:    ?Shortness of breath upon exertion:    ?Short of breath when lying flat:    ?Irregular heart rhythm:    ?    ?Vascular    ?Pain in calf, thigh, or hip brought on by ambulation:    ?Pain in feet at night that wakes you up from your sleep:     ?Blood clot in your veins:    ?Leg swelling:  x   ?    ?Pulmonary    ?Oxygen at home:    ?Productive cough:     ?Wheezing:     ?     ?  Neurologic    ?Sudden weakness in arms or legs:     ?Sudden numbness in arms or legs:     ?Sudden onset of difficulty speaking or slurred speech:    ?Temporary loss of vision in one eye:     ?Problems with dizziness:     ?    ?Gastrointestinal    ?Blood in stool:     ?Vomited blood:     ?    ?Genitourinary    ?Burning when urinating:     ?Blood in urine:    ?    ?Psychiatric    ?Major depression:     ?    ?Hematologic    ?Bleeding problems:    ?Problems with blood clotting too easily:    ?    ?Skin    ?Rashes or ulcers:    ?    ?Constitutional    ?Fever or chills:    ? ?PHYSICAL EXAM:  ? ?Vitals:  ? 02/20/22 1414  ?BP: (!) 158/83  ?Pulse: (!) 58  ?Resp: 18  ?Temp: 98.3 ?F (36.8 ?C)  ?TempSrc: Temporal  ?SpO2: 97%  ?Weight: 208 lb 12.8 oz (94.7 kg)  ?Height: '6\' 2"'$  (1.88 m)  ? ? ?GENERAL: The patient is a well-nourished male, in no acute distress. The vital signs are documented above. ?CARDIAC: There is a regular rate and rhythm.  ?VASCULAR: I do not detect carotid bruits. ?He has palpable pedal pulses. ?He has some varicosities along his anterior right leg and multiple areas of telangiectasias and reticular veins. ? ? ?I did look at his great saphenous vein myself on the right with the SonoSite.  He has significant reflux down to the knee.  The vein is dilated down to the knee.  I would likely cannulate the vein in the distal thigh. ? ?PULMONARY: There is good air exchange bilaterally without wheezing or rales. ?ABDOMEN: Soft and non-tender with normal pitched bowel sounds.  ?MUSCULOSKELETAL: There are no major deformities or cyanosis. ?NEUROLOGIC: No focal weakness or paresthesias are detected. ?SKIN: There are no ulcers or rashes noted. ?PSYCHIATRIC: The patient has a normal affect. ? ?DATA:   ? ?VENOUS DUPLEX: I have independently interpreted his venous duplex scan today.  This was of the right lower extremity only. ? ?There is no evidence of DVT. ? ?There is deep venous reflux in the common femoral  vein. ? ?There was superficial venous reflux in the right great saphenous vein.  Diameters of the vein in the thigh ranged from 5 to 7 mm. ? ? ? ? ? ? ?Deitra Mayo ?[facility] ?Office [phone]

## 2022-02-26 ENCOUNTER — Encounter: Payer: Self-pay | Admitting: Internal Medicine

## 2022-02-26 ENCOUNTER — Other Ambulatory Visit: Payer: Self-pay | Admitting: Internal Medicine

## 2022-02-26 DIAGNOSIS — Z79899 Other long term (current) drug therapy: Secondary | ICD-10-CM

## 2022-02-26 DIAGNOSIS — B351 Tinea unguium: Secondary | ICD-10-CM

## 2022-03-01 ENCOUNTER — Other Ambulatory Visit: Payer: BC Managed Care – PPO

## 2022-03-02 LAB — COMPLETE METABOLIC PANEL WITH GFR
AG Ratio: 1.7 (calc) (ref 1.0–2.5)
ALT: 17 U/L (ref 9–46)
AST: 19 U/L (ref 10–35)
Albumin: 4.5 g/dL (ref 3.6–5.1)
Alkaline phosphatase (APISO): 42 U/L (ref 35–144)
BUN/Creatinine Ratio: 21 (calc) (ref 6–22)
BUN: 30 mg/dL — ABNORMAL HIGH (ref 7–25)
CO2: 28 mmol/L (ref 20–32)
Calcium: 10.1 mg/dL (ref 8.6–10.3)
Chloride: 101 mmol/L (ref 98–110)
Creat: 1.46 mg/dL — ABNORMAL HIGH (ref 0.70–1.35)
Globulin: 2.6 g/dL (calc) (ref 1.9–3.7)
Glucose, Bld: 107 mg/dL — ABNORMAL HIGH (ref 65–99)
Potassium: 4.4 mmol/L (ref 3.5–5.3)
Sodium: 141 mmol/L (ref 135–146)
Total Bilirubin: 0.4 mg/dL (ref 0.2–1.2)
Total Protein: 7.1 g/dL (ref 6.1–8.1)
eGFR: 54 mL/min/{1.73_m2} — ABNORMAL LOW (ref >=60)

## 2022-03-02 NOTE — Progress Notes (Signed)
<><><><><><><><><><><><><><><><><><><><><><><><><><><><><><><><><> <><><><><><><><><><><><><><><><><><><><><><><><><><><><><><><><><>  -   Blood tests show Liver enzymes all normal &                                                 OK to continue the Lamisil for Toenail fungus, But . . . . . .  - Kidney tests show new changes suggesting mild dehydration                                                                                   - Very import to Drink more fluids                                             At least 100 oz  / day                                                                    That's 3 liters or about 3 quarts                                                                                                       or  6 bottles  ( 16 ) oz  <><><><><><><><><><><><><><><><><><><><><><><><><><><><><><><><><> <><><><><><><><><><><><><><><><><><><><><><><><><><><><><><><><><>  -

## 2022-03-04 DIAGNOSIS — Z79899 Other long term (current) drug therapy: Secondary | ICD-10-CM

## 2022-03-07 ENCOUNTER — Other Ambulatory Visit: Payer: Self-pay | Admitting: *Deleted

## 2022-03-07 DIAGNOSIS — I83811 Varicose veins of right lower extremities with pain: Secondary | ICD-10-CM

## 2022-04-05 ENCOUNTER — Ambulatory Visit: Payer: BC Managed Care – PPO | Admitting: Nurse Practitioner

## 2022-04-05 ENCOUNTER — Encounter: Payer: Self-pay | Admitting: Nurse Practitioner

## 2022-04-05 VITALS — BP 125/85 | HR 71 | Temp 96.8°F | Wt 207.0 lb

## 2022-04-05 DIAGNOSIS — E559 Vitamin D deficiency, unspecified: Secondary | ICD-10-CM

## 2022-04-05 DIAGNOSIS — K5730 Diverticulosis of large intestine without perforation or abscess without bleeding: Secondary | ICD-10-CM

## 2022-04-05 DIAGNOSIS — G4733 Obstructive sleep apnea (adult) (pediatric): Secondary | ICD-10-CM

## 2022-04-05 DIAGNOSIS — R7309 Other abnormal glucose: Secondary | ICD-10-CM

## 2022-04-05 DIAGNOSIS — I10 Essential (primary) hypertension: Secondary | ICD-10-CM

## 2022-04-05 DIAGNOSIS — Z79899 Other long term (current) drug therapy: Secondary | ICD-10-CM

## 2022-04-05 DIAGNOSIS — N138 Other obstructive and reflux uropathy: Secondary | ICD-10-CM

## 2022-04-05 DIAGNOSIS — B351 Tinea unguium: Secondary | ICD-10-CM

## 2022-04-05 DIAGNOSIS — K760 Fatty (change of) liver, not elsewhere classified: Secondary | ICD-10-CM

## 2022-04-05 DIAGNOSIS — I700 Atherosclerosis of aorta: Secondary | ICD-10-CM

## 2022-04-05 DIAGNOSIS — E782 Mixed hyperlipidemia: Secondary | ICD-10-CM

## 2022-04-05 DIAGNOSIS — I83893 Varicose veins of bilateral lower extremities with other complications: Secondary | ICD-10-CM | POA: Diagnosis not present

## 2022-04-05 DIAGNOSIS — N401 Enlarged prostate with lower urinary tract symptoms: Secondary | ICD-10-CM

## 2022-04-05 DIAGNOSIS — Z9989 Dependence on other enabling machines and devices: Secondary | ICD-10-CM

## 2022-04-06 LAB — COMPLETE METABOLIC PANEL WITH GFR
AG Ratio: 1.8 (calc) (ref 1.0–2.5)
ALT: 17 U/L (ref 9–46)
AST: 20 U/L (ref 10–35)
Albumin: 4.5 g/dL (ref 3.6–5.1)
Alkaline phosphatase (APISO): 43 U/L (ref 35–144)
BUN/Creatinine Ratio: 25 (calc) — ABNORMAL HIGH (ref 6–22)
BUN: 34 mg/dL — ABNORMAL HIGH (ref 7–25)
CO2: 27 mmol/L (ref 20–32)
Calcium: 10.1 mg/dL (ref 8.6–10.3)
Chloride: 102 mmol/L (ref 98–110)
Creat: 1.35 mg/dL (ref 0.70–1.35)
Globulin: 2.5 g/dL (calc) (ref 1.9–3.7)
Glucose, Bld: 104 mg/dL — ABNORMAL HIGH (ref 65–99)
Potassium: 4.4 mmol/L (ref 3.5–5.3)
Sodium: 138 mmol/L (ref 135–146)
Total Bilirubin: 0.4 mg/dL (ref 0.2–1.2)
Total Protein: 7 g/dL (ref 6.1–8.1)
eGFR: 59 mL/min/{1.73_m2} — ABNORMAL LOW (ref >=60)

## 2022-04-06 LAB — CBC WITH DIFFERENTIAL/PLATELET
Absolute Monocytes: 509 cells/uL (ref 200–950)
Basophils Absolute: 69 cells/uL (ref 0–200)
Basophils Relative: 1.3 %
Eosinophils Absolute: 138 cells/uL (ref 15–500)
Eosinophils Relative: 2.6 %
HCT: 40.4 % (ref 38.5–50.0)
Hemoglobin: 13.8 g/dL (ref 13.2–17.1)
Lymphs Abs: 1246 cells/uL (ref 850–3900)
MCH: 31.2 pg (ref 27.0–33.0)
MCHC: 34.2 g/dL (ref 32.0–36.0)
MCV: 91.2 fL (ref 80.0–100.0)
MPV: 10.2 fL (ref 7.5–12.5)
Monocytes Relative: 9.6 %
Neutro Abs: 3339 cells/uL (ref 1500–7800)
Neutrophils Relative %: 63 %
Platelets: 372 10*3/uL (ref 140–400)
RBC: 4.43 10*6/uL (ref 4.20–5.80)
RDW: 11.6 % (ref 11.0–15.0)
Total Lymphocyte: 23.5 %
WBC: 5.3 10*3/uL (ref 3.8–10.8)

## 2022-04-06 LAB — VITAMIN D 25 HYDROXY (VIT D DEFICIENCY, FRACTURES): Vit D, 25-Hydroxy: 59 ng/mL (ref 30–100)

## 2022-04-09 ENCOUNTER — Other Ambulatory Visit: Payer: Self-pay | Admitting: *Deleted

## 2022-04-09 MED ORDER — LORAZEPAM 1 MG PO TABS: ORAL_TABLET | ORAL | 0 refills | Status: DC

## 2022-04-17 ENCOUNTER — Ambulatory Visit (INDEPENDENT_AMBULATORY_CARE_PROVIDER_SITE_OTHER): Payer: BC Managed Care – PPO | Admitting: Vascular Surgery

## 2022-04-17 ENCOUNTER — Encounter: Payer: Self-pay | Admitting: Vascular Surgery

## 2022-04-17 VITALS — BP 139/90 | HR 66 | Temp 98.0°F | Resp 16 | Ht 74.0 in | Wt 212.0 lb

## 2022-04-17 DIAGNOSIS — I83811 Varicose veins of right lower extremities with pain: Secondary | ICD-10-CM | POA: Diagnosis not present

## 2022-04-17 NOTE — Progress Notes (Signed)
Laser Ablation Procedure Date: 04/17/2022  Tony Bailey. Tony Bailey  DOB: 12/30/1960  Consent signed: YES  Surgeon: Deitra Mayo MD  Procedure: Laser Ablation: Right Greater Saphenous Vein   B/P: 139/90 (B/P location: Right arm; pt sitting; normal cuff size), Resp 16, Pulse 66, Temp 98 degrees Farenheit (temporal), Weight 212 lbs, Ht 6'2'', SpO2 100%  Tumescent Anesthesia: 450 mL 0.9% NaCl with 50 mL Lidocaine HCL 1% and 15 mL 8.4% NaHCO3.  Local Anesthesia: 4 mL Lidocaine HCL 1% and NaHCO3 (ratio 2:1)  7 Watts continuous mode  Total energy: 1665 Joules  Total time: 237 seconds  Treatment length: 37 cm  Laser Fiber Reference #: [id number] Lot # [id number]  Stab Phlebectomies: <10 Sites, Calf and Lower Leg  Patient tolerated procedure very well.   Notes: All staff members wore facial masks. Patient took 1 mg Ativan prior to arriving this morning. He did not feel he needed to take the other one.   Description of Procedure:   After marking the course of the secondary varicosities, the pt was placed on the operating table in the supine position. The RLE was prepped with Betadine and draped in sterile fashion. Local anesthetic was administered and the saphenous vein was accessed under ultrasound guidance, with a micro needle and guidewire. Then the micro puncture sheath was placed. A guidewire was inserted at saphenofemoral junction, followed by a 5 french sheath.  The position of the sheath and then the laser fiber below the junction was confirmed using the ultrasound.  Tumescent anesthesia was administered along the course of the saphenous vein using ultrasound guidance. The patient was placed in Trendelenburg position and protective laser glasses were placed on pt and staff.  The laser was fired at 7 watts of continuous mode for a total of 1665 Joules.   For stab phlebectomies, local anesthetic was administered at the previously marked varicosities, and tumescent anesthesia was administered  around the vessels. Less than 10 stab wounds were made using the tip of an 11 blade. Using a small vein hook, the phlebectomies were performed with a hemostat to avulse the varicosities. Adequate hemostasis was achieved.   Steristrips were applied to the sab wounds. ABD pads were also applied covering the entire length of RLE that was treated with laser and stabs. Thigh high compression stocking was applied over that and ACE wrap bandages were applied over the phlebectomy sites and at the top of the saphenofemoral junction. Blood loss was less than 15 mL. Discharge instructions were reviewed with patient and a hardcopy was given to the pt to take home. The pt ambulated out of the room, having tolerated procedure well.

## 2022-04-17 NOTE — Progress Notes (Signed)
Patient name: Tony Bailey MRN: [id number] DOB: 03-Jul-1961 Sex: male  REASON FOR VISIT: For laser ablation of the right great saphenous vein with less than 10 stabs  HPI: Tony Bailey is a 61 y.o. male who is undergone previous laser ablation of the left great saphenous vein and stab phlebectomies.  He presented with painful varicose veins of the right lower extremity and had failed conservative treatment.  He was felt to be a candidate for laser ablation of the right great saphenous vein with less than 10 stabs. Current Outpatient Medications  Medication Sig Dispense Refill   aspirin EC 81 MG tablet Take 81 mg by mouth daily. Swallow whole.     bisoprolol-hydrochlorothiazide (ZIAC) 5-6.25 MG tablet Take  1 tablet  Daily for BP                                            /                    TAKE 1 TABLET BY MOUTH 90 tablet 3   Cholecalciferol (VITAMIN D-3 PO) Take 10,000 Units by mouth daily.      Cinnamon 500 MG capsule Take 100 mg by mouth 2 (two) times daily.     fenofibrate (TRICOR) 145 MG tablet TAKE 1 TABLET BY MOUTH DAILY FOR TRIGLYCERIDES 90 tablet 2   finasteride (PROSCAR) 5 MG tablet Take 1 tablet (5 mg total) by mouth daily. To prevent prostate bleeding and regrowth 90 tablet 3   fluticasone (FLONASE) 50 MCG/ACT nasal spray SHAKE LIQUID AND USE 1 TO 2 SPRAYS IN EACH NOSTRIL 1 TO 2 TIMES PER DAY 48 g 3   LORazepam (ATIVAN) 1 MG tablet Take 1 tablet 30 minutes prior to leaving house on day of office surgery.  Bring second tablet with you to office on day of office surgery. 2 tablet 0   Multiple Vitamin (MULTIVITAMIN) tablet Take 1 tablet by mouth daily.     Omega-3 Fatty Acids (FISH OIL PO) Take 2,400 mg by mouth 2 (two) times daily.      rosuvastatin (CRESTOR) 40 MG tablet Take 1 tablet (40 mg total) by mouth daily. 90 tablet 3   terbinafine (LAMISIL) 250 MG tablet Take  1 tablet  Daily  for Nail Fungus 90 tablet 1   zinc gluconate 50 MG tablet Take 50 mg by mouth daily.      No current facility-administered medications for this visit.    PHYSICAL EXAM: There were no vitals filed for this visit.  PROCEDURE: Laser ablation right great saphenous vein with less than 10 stabs  TECHNIQUE: The patient was taken to the exam room and I marked the dilated varicose veins with the patient standing.  The patient was then placed supine.  I looked at the left great saphenous vein myself with the SonoSite and I felt that I could cannulate this in the distal thigh.  The right leg was prepped and draped in usual sterile fashion.  Under ultrasound guidance, after the skin was anesthetized, I cannulated the right great saphenous vein with a micropuncture needle and a micropuncture sheath was introduced over the wire.  I then advanced the wire to just below the saphenofemoral junction under ultrasound guidance.  The sheath and dilator were then advanced over the wire and the wire and dilator removed.  I position the sheath  about 2-1/2 cm distal to the saphenofemoral junction.  The laser fiber was positioned at the end of the sheath.  The sheath was retracted.  Again I confirmed that we were 2.5 cm distal to the saphenofemoral junction.  Tumescent anesthesia was then administered circumferentially around the vein.  The patient was placed in Trendelenburg.  Laser ablation was performed of the left great saphenous vein from 2-1/2 cm distal to the saphenofemoral junction to the distal thigh.  50 J/cm was used at 77 W.  Next attention was turned to stab phlebectomies.  All the marked areas were anesthetized with tumescent anesthesia.  Using approximately 6 small stab incisions with an 11 blade the veins were "brought above the skin and then grasped with a hemostat and bluntly excised.  Pressure was held for hemostasis.  A sterile dressing was applied.  Steri-Strips have been applied.  Patient tolerated the procedure well and will return in 2 weeks for a follow-up duplex.  Deitra Mayo [facility] [phone]

## 2022-05-01 ENCOUNTER — Ambulatory Visit (HOSPITAL_COMMUNITY)
Admission: RE | Admit: 2022-05-01 | Discharge: 2022-05-01 | Disposition: A | Discharge status: Home / Self Care | Payer: BC Managed Care – PPO | Source: Ambulatory Visit | Attending: Vascular Surgery | Admitting: Vascular Surgery

## 2022-05-01 ENCOUNTER — Encounter: Payer: Self-pay | Admitting: Vascular Surgery

## 2022-05-01 ENCOUNTER — Ambulatory Visit (INDEPENDENT_AMBULATORY_CARE_PROVIDER_SITE_OTHER): Payer: BC Managed Care – PPO | Admitting: Vascular Surgery

## 2022-05-01 VITALS — BP 144/86 | HR 69 | Temp 98.2°F | Resp 16 | Ht 74.0 in | Wt 212.0 lb

## 2022-05-01 DIAGNOSIS — I872 Venous insufficiency (chronic) (peripheral): Secondary | ICD-10-CM | POA: Diagnosis not present

## 2022-05-01 DIAGNOSIS — I83811 Varicose veins of right lower extremities with pain: Secondary | ICD-10-CM

## 2022-05-01 DIAGNOSIS — Z48812 Encounter for surgical aftercare following surgery on the circulatory system: Secondary | ICD-10-CM

## 2022-05-01 NOTE — Progress Notes (Signed)
Patient name: Tony Bailey MRN: [id number] DOB: [birth year] Sex: male  REASON FOR VISIT: Follow-up after laser ablation of the right great saphenous vein with less than 10 stabs.  HPI: Tony Bailey is a 61 y.o. male who is previously undergone laser ablation of the left great saphenous vein with stab phlebectomies.  He presented with painful varicose veins on the right side and had failed conservative treatment.  On 04/17/2022 he underwent laser ablation of the right great saphenous vein from 2-1/2 cm distal to the saphenofemoral junction to the distal thigh.  He comes in for a 2-week follow-up visit.  He has no specific complaints today.  He denies chest pain or shortness of breath.  He has wore his thigh-high compression stockings for 2 weeks.  Current Outpatient Medications  Medication Sig Dispense Refill   aspirin EC 81 MG tablet Take 81 mg by mouth daily. Swallow whole.     bisoprolol-hydrochlorothiazide (ZIAC) 5-6.25 MG tablet Take  1 tablet  Daily for BP                                            /                    TAKE 1 TABLET BY MOUTH 90 tablet 3   Cholecalciferol (VITAMIN D-3 PO) Take 10,000 Units by mouth daily.      Cinnamon 500 MG capsule Take 100 mg by mouth 2 (two) times daily.     fenofibrate (TRICOR) 145 MG tablet TAKE 1 TABLET BY MOUTH DAILY FOR TRIGLYCERIDES 90 tablet 2   finasteride (PROSCAR) 5 MG tablet Take 1 tablet (5 mg total) by mouth daily. To prevent prostate bleeding and regrowth 90 tablet 3   fluticasone (FLONASE) 50 MCG/ACT nasal spray SHAKE LIQUID AND USE 1 TO 2 SPRAYS IN EACH NOSTRIL 1 TO 2 TIMES PER DAY 48 g 3   Multiple Vitamin (MULTIVITAMIN) tablet Take 1 tablet by mouth daily.     Omega-3 Fatty Acids (FISH OIL PO) Take 2,400 mg by mouth 2 (two) times daily.      rosuvastatin (CRESTOR) 40 MG tablet Take 1 tablet (40 mg total) by mouth daily. 90 tablet 3   terbinafine (LAMISIL) 250 MG tablet Take  1 tablet  Daily  for Nail Fungus 90 tablet 1   zinc  gluconate 50 MG tablet Take 50 mg by mouth daily.     LORazepam (ATIVAN) 1 MG tablet Take 1 tablet 30 minutes prior to leaving house on day of office surgery.  Bring second tablet with you to office on day of office surgery. 2 tablet 0   No current facility-administered medications for this visit.   REVIEW OF SYSTEMS: Valu.Nieves ] denotes positive finding; [  ] denotes negative finding  CARDIOVASCULAR:  '[ ]'$  chest pain   '[ ]'$  dyspnea on exertion  '[ ]'$  leg swelling  CONSTITUTIONAL:  '[ ]'$  fever   '[ ]'$  chills  PHYSICAL EXAM: Vitals:   05/01/22 1019  BP: (!) 144/86  Pulse: 69  Resp: 16  Temp: 98.2 F (36.8 C)  TempSrc: Temporal  SpO2: 96%  Weight: 212 lb (96.2 kg)  Height: '6\' 2"'$  (1.88 m)   GENERAL: The patient is a well-nourished male, in no acute distress. The vital signs are documented above. CARDIOVASCULAR: There is a regular rate and rhythm. PULMONARY: There  is good air exchange bilaterally without wheezing or rales. VASCULAR: His Steri-Strips are in place. He has no significant lower extremity swelling. He has minimal bruising in the right thigh.  DATA:  VENOUS DUPLEX: I have independently interpreted his venous duplex scan today.  This was of the right lower extremity.  There is no evidence of DVT.  The right great saphenous vein is successfully closed from 1.5 cm distal to the saphenofemoral junction to the distal thigh.  MEDICAL ISSUES:  S/P LASER ABLATION RIGHT GREAT SAPHENOUS VEIN: The patient is doing well status post laser ablation of the right great saphenous vein.  We have again discussed the importance of daily leg elevation, compression therapy, exercise, and avoiding prolonged sitting and standing.  I will see him back as needed.  Deitra Mayo [facility] [phone]

## 2022-06-12 DIAGNOSIS — I8393 Asymptomatic varicose veins of bilateral lower extremities: Secondary | ICD-10-CM

## 2022-06-27 ENCOUNTER — Encounter: Payer: BC Managed Care – PPO | Admitting: Adult Health

## 2022-07-08 ENCOUNTER — Encounter: Payer: Self-pay | Admitting: Nurse Practitioner

## 2022-07-08 ENCOUNTER — Ambulatory Visit (INDEPENDENT_AMBULATORY_CARE_PROVIDER_SITE_OTHER): Payer: BC Managed Care – PPO | Admitting: Nurse Practitioner

## 2022-07-08 VITALS — BP 138/82 | HR 76 | Temp 97.7°F | Ht 73.5 in | Wt 211.2 lb

## 2022-07-08 DIAGNOSIS — Z79899 Other long term (current) drug therapy: Secondary | ICD-10-CM

## 2022-07-08 DIAGNOSIS — Z1389 Encounter for screening for other disorder: Secondary | ICD-10-CM | POA: Diagnosis not present

## 2022-07-08 DIAGNOSIS — D508 Other iron deficiency anemias: Secondary | ICD-10-CM | POA: Diagnosis not present

## 2022-07-08 DIAGNOSIS — E782 Mixed hyperlipidemia: Secondary | ICD-10-CM

## 2022-07-08 DIAGNOSIS — Z8249 Family history of ischemic heart disease and other diseases of the circulatory system: Secondary | ICD-10-CM

## 2022-07-08 DIAGNOSIS — Z131 Encounter for screening for diabetes mellitus: Secondary | ICD-10-CM | POA: Diagnosis not present

## 2022-07-08 DIAGNOSIS — Z86010 Personal history of colonic polyps: Secondary | ICD-10-CM

## 2022-07-08 DIAGNOSIS — R350 Frequency of micturition: Secondary | ICD-10-CM | POA: Diagnosis not present

## 2022-07-08 DIAGNOSIS — K5730 Diverticulosis of large intestine without perforation or abscess without bleeding: Secondary | ICD-10-CM

## 2022-07-08 DIAGNOSIS — I10 Essential (primary) hypertension: Secondary | ICD-10-CM | POA: Diagnosis not present

## 2022-07-08 DIAGNOSIS — D649 Anemia, unspecified: Secondary | ICD-10-CM | POA: Diagnosis not present

## 2022-07-08 DIAGNOSIS — N401 Enlarged prostate with lower urinary tract symptoms: Secondary | ICD-10-CM

## 2022-07-08 DIAGNOSIS — Z125 Encounter for screening for malignant neoplasm of prostate: Secondary | ICD-10-CM | POA: Diagnosis not present

## 2022-07-08 DIAGNOSIS — I83893 Varicose veins of bilateral lower extremities with other complications: Secondary | ICD-10-CM

## 2022-07-08 DIAGNOSIS — I700 Atherosclerosis of aorta: Secondary | ICD-10-CM | POA: Diagnosis not present

## 2022-07-08 DIAGNOSIS — Z0000 Encounter for general adult medical examination without abnormal findings: Secondary | ICD-10-CM | POA: Diagnosis not present

## 2022-07-08 DIAGNOSIS — Z0001 Encounter for general adult medical examination with abnormal findings: Secondary | ICD-10-CM

## 2022-07-08 DIAGNOSIS — R7309 Other abnormal glucose: Secondary | ICD-10-CM

## 2022-07-08 DIAGNOSIS — B351 Tinea unguium: Secondary | ICD-10-CM

## 2022-07-08 DIAGNOSIS — I872 Venous insufficiency (chronic) (peripheral): Secondary | ICD-10-CM

## 2022-07-08 DIAGNOSIS — E559 Vitamin D deficiency, unspecified: Secondary | ICD-10-CM | POA: Diagnosis not present

## 2022-07-08 DIAGNOSIS — E663 Overweight: Secondary | ICD-10-CM

## 2022-07-08 DIAGNOSIS — Z13220 Encounter for screening for lipoid disorders: Secondary | ICD-10-CM

## 2022-07-08 DIAGNOSIS — K760 Fatty (change of) liver, not elsewhere classified: Secondary | ICD-10-CM

## 2022-07-08 DIAGNOSIS — Z136 Encounter for screening for cardiovascular disorders: Secondary | ICD-10-CM | POA: Diagnosis not present

## 2022-07-08 DIAGNOSIS — K439 Ventral hernia without obstruction or gangrene: Secondary | ICD-10-CM

## 2022-07-08 DIAGNOSIS — N138 Other obstructive and reflux uropathy: Secondary | ICD-10-CM

## 2022-07-08 DIAGNOSIS — G4733 Obstructive sleep apnea (adult) (pediatric): Secondary | ICD-10-CM

## 2022-07-08 NOTE — Patient Instructions (Signed)

## 2022-07-08 NOTE — Progress Notes (Signed)
Complete Physical  Assessment and Plan:  Encounter for general adult medical examination with abnormal findings Due annually  Essential hypertension Controlled Continue bASA, bisoprolol-hctz,  Discussed DASH (Dietary Approaches to Stop Hypertension) DASH diet is lower in sodium than a typical American diet. Cut back on foods that are high in saturated fat, cholesterol, and trans fats. Eat more whole-grain foods, fish, poultry, and nuts Remain active and exercise as tolerated daily.  Monitor BP at home-Call if greater than 130/80.  Check CMP/CBC   Varicose veins of lower extremities with complications, bilateral/chronic venous insufficieny Ablation 04/2022 Continue bASA Vascular Surgery following, Dr. Scot Dock, PRN  Aortic atherosclerosis [facility] CT 06/27/2021 Continue Fenofibrate, Omega 3, Rosuvastatin Continue to monitor Check lipids  OSA on CPAP Continue CPAP.  Refer back to Sleep Study  Hepatic steatosis - CT 06/27/2021 Resolved. Continue to monitor Check LFTs  Colon, diverticulosis/History of adenomatous polyp of colon/black tarry stool New onset blood in stool - possibly resolved Stool card provided. Continue monitoring dietary intake of food triggers. High fiber diet. Stay well hydrated. Continue colonoscopy screening due 2027 May need to move up if symptoms fail to improve.  BPH with obstruction/lower urinary tract symptoms Controlled. Continue Proscar  Continue to monitor Check PSA   Hyperlipidemia, mixed Continue fenofibrate, rosuvastatin Discussed lifestyle modifications. Recommended diet heavy in fruits and veggies, omega 3's. Decrease consumption of animal meats, cheeses, and dairy products. Remain active and exercise as tolerated. Continue to monitor. Check lipids/TSH  Vitamin D deficiency At goal. Continue Vitamin D Supplement Check Vitamin D levels.  Abnormal glucose (prediabetes) Education: Reviewed 'ABCs' of diabetes management   Discussed goals to be met and/or maintained include A1C (<7) Blood pressure (<130/80) Cholesterol (LDL <70) Continue Eye Exam yearly  Continue Dental Exam Q6 mo Discussed dietary recommendations Discussed Physical Activity recommendations Check A1C   Medication management All medications discussed and reviewed in full. All questions and concerns regarding medications addressed.     Overweight (BMI 25.0-29.9) Continue lifestyle modifications. Focus on consuming a diet heavy in fruits and veggies, omega 3's. Decrease consumption of animal meats, cheeses, and dairy products. Remain active and incorporate daily tolerated exercise. Continue to monitor weight loss.  Family history of abdominal aortic aneurysm (AAA) Abdominal US  Continue to monitor  Onychomycosis Completed Terbinafine Mild improvement Refer to podiatry for further review and evaluation.   Screening for proteinuria or hematuria Check and monitor urine  Screening for prostate cancer  Check and monitor PSA  Screening for cardiovascular condition Check and monitor EKG and aorta scan  Ventral Hernia  Referral to General Surgery  Orders Placed This Encounter  Procedures   CBC with Differential/Platelet   COMPLETE METABOLIC PANEL WITH GFR   Magnesium   Lipid panel   TSH   Hemoglobin A1c   Insulin, random   VITAMIN D 25 Hydroxy (Vit-D Deficiency, Fractures)   Urinalysis, Routine w reflex microscopic   PSA   Ambulatory referral to Podiatry    Referral Priority:   Routine    Referral Type:   Consultation    Referral Reason:   Specialty Services Required    Requested Specialty:   Podiatry    Number of Visits Requested:   1   Ambulatory referral to General Surgery    Referral Priority:   Routine    Referral Type:   Surgical    Referral Reason:   Specialty Services Required    Requested Specialty:   General Surgery    Number of Visits Requested:   1  Ambulatory referral to Sleep Studies     Referral Priority:   Routine    Referral Type:   Consultation    Referral Reason:   Specialty Services Required    Number of Visits Requested:   1   [place], RETROPERITNL ABD,  LTD   EKG 12-Lead     Discussed med's effects and SE's. Screening labs and tests as requested with regular follow-up as recommended.  Over 40 minutes of exam, counseling, chart review, and complex, high level critical decision making was performed this visit.   Future Appointments  Date Time Provider [place]  07/09/2023  9:00 AM Darrol Jump, NP [facility] None    HPI  61 y.o. male  presents for a complete physical. He has Essential hypertension; Hyperlipidemia, mixed; Vitamin D deficiency; OSA on CPAP; Abnormal glucose (prediabetes); Medication management; Overweight (BMI 25.0-29.9); BPH with obstruction/lower urinary tract symptoms; History of adenomatous polyp of colon; Varicose veins of lower extremities with complications, bilateral; Family history of abdominal aortic aneurysm (AAA); Aortic atherosclerosis ([place])- CT 06/27/2021; Hepatic steatosis - CT 06/27/2021; and Colon, diverticulosis on their problem list.  Overall he reports doing well.  He reports having a week of dark tarry stools that have no resolved.  Denies constipation, N/V, hemorrhoids, acid reflux, abdominal pain.  Reports father had a hx of colon cancer.  He is UTD on colonoscopy due 2027.  X1 polyp.    Has has of OSA with original sleep study being in [place], 2016.  He has noticed increase in daytime sleepiness.  Has not had his machine, or equipment checked since 2016.   Recently followed up with Vascular on 04/2022 and completed a successful  ablation on RLE.  He has had a successful ablation of the LLE in the past.     He has been taking Terbinafine for tmt of Onychomycosis.  Not much improvement since completing course. Most notable 2nd digit right toe with is hard and thick and eyllow.  He did have a decline in kidney fx last  check 02/2022 while on the medication.  Liver enzymes have remained WNL.   BMI is Body mass index is 27.49 kg/m., he has been working on diet and exercise. Wt Readings from Last 3 Encounters:  07/08/22 211 lb 3.2 oz (95.8 kg)  05/01/22 212 lb (96.2 kg)  04/17/22 212 lb (96.2 kg)   His blood pressure has been controlled at home, today their BP is BP: 138/82  He does workout. He denies chest pain, shortness of breath, dizziness.   He is concerned for large ventral hernia.  Denies pain, N/V, difficulty swallowing.  Has not had worked up or evaluated in the past.   He is on cholesterol medication and denies myalgias. Reports family hx of AAA, in father, Has never been a smoker.  His cholesterol  is at goal. The cholesterol last visit was:   Lab Results  Component Value Date   CHOL 154 01/02/2022   HDL 54 01/02/2022   LDLCALC 80 01/02/2022   TRIG 104 01/02/2022   CHOLHDL 2.9 01/02/2022    He has been working on diet and exercise for is not, he is on bASA, he is on ACE/ARB and denies polydipsia and polyuria. Last A1C in the office was:  Lab Results  Component Value Date   HGBA1C 5.8 (H) 01/02/2022   Last GFR: Lab Results  Component Value Date   EGFR 59 (L) 04/05/2022   Patient is on Vitamin D supplement.   Lab Results  Component Value Date   VD25OH 79 04/05/2022     Last PSA was: Lab Results  Component Value Date   PSA 1.35 06/27/2021     Current Medications:  Current Outpatient Medications on File Prior to Visit  Medication Sig Dispense Refill   aspirin EC 81 MG tablet Take 81 mg by mouth daily. Swallow whole.     bisoprolol-hydrochlorothiazide (ZIAC) 5-6.25 MG tablet Take  1 tablet  Daily for BP                                            /                    TAKE 1 TABLET BY MOUTH 90 tablet 3   Cholecalciferol (VITAMIN D-3 PO) Take 10,000 Units by mouth daily.      Cinnamon 500 MG capsule Take 100 mg by mouth 2 (two) times daily.     fenofibrate (TRICOR) 145 MG  tablet TAKE 1 TABLET BY MOUTH DAILY FOR TRIGLYCERIDES 90 tablet 2   finasteride (PROSCAR) 5 MG tablet Take 1 tablet (5 mg total) by mouth daily. To prevent prostate bleeding and regrowth 90 tablet 3   fluticasone (FLONASE) 50 MCG/ACT nasal spray SHAKE LIQUID AND USE 1 TO 2 SPRAYS IN EACH NOSTRIL 1 TO 2 TIMES PER DAY 48 g 3   Multiple Vitamin (MULTIVITAMIN) tablet Take 1 tablet by mouth daily.     Omega-3 Fatty Acids (FISH OIL PO) Take 2,400 mg by mouth 2 (two) times daily.      rosuvastatin (CRESTOR) 40 MG tablet Take 1 tablet (40 mg total) by mouth daily. 90 tablet 3   zinc gluconate 50 MG tablet Take 50 mg by mouth daily.     LORazepam (ATIVAN) 1 MG tablet Take 1 tablet 30 minutes prior to leaving house on day of office surgery.  Bring second tablet with you to office on day of office surgery. 2 tablet 0   terbinafine (LAMISIL) 250 MG tablet Take  1 tablet  Daily  for Nail Fungus (Patient not taking: Reported on 07/08/2022) 90 tablet 1   No current facility-administered medications on file prior to visit.   Allergies:  No Known Allergies Medical History:  He has Essential hypertension; Hyperlipidemia, mixed; Vitamin D deficiency; OSA on CPAP; Abnormal glucose (prediabetes); Medication management; Overweight (BMI 25.0-29.9); BPH with obstruction/lower urinary tract symptoms; History of adenomatous polyp of colon; Varicose veins of lower extremities with complications, bilateral; Family history of abdominal aortic aneurysm (AAA); Aortic atherosclerosis ([place])- CT 06/27/2021; Hepatic steatosis - CT 06/27/2021; and Colon, diverticulosis on their problem list.  Health Maintenance:   Immunization History  Administered Date(s) Administered   Influenza Inj Mdck Quad Pf 08/10/2020   Influenza Inj Mdck Quad With Preservative 09/03/2017, 07/28/2018   Influenza Split 08/23/2014   Influenza Whole 08/09/2013   Influenza, Seasonal, Injecte, Preservative Fre 07/30/2016   Influenza-Unspecified 08/01/2019,  08/10/2020, 08/23/2021   PFIZER(Purple Top)SARS-COV-2 Vaccination 12/30/2019, 01/20/2020, 07/31/2020, 03/30/2021, 10/01/2021   PPD Test 08/23/2014, 10/18/2015, 01/29/2017, 04/09/2018, 05/10/2019, 05/17/2020   Pneumococcal Conjugate-13 08/23/2014   Tdap 01/17/2016   Zoster Recombinat (Shingrix) 09/12/2019, 11/20/2019   Health Maintenance  Topic Date Due   COVID-19 Vaccine (6 - Pfizer risk series) 11/26/2021   INFLUENZA VACCINE  05/14/2022   TETANUS/TDAP  01/16/2026   COLONOSCOPY (Pts 45-63yr Insurance coverage will need to be confirmed)  07/28/2027   Hepatitis C Screening  Completed   Zoster Vaccines- Shingrix  Completed   HPV VACCINES  Aged Out   HIV Screening  Discontinued    Sexually Active: yes STD testing offered  Colonoscopy: 07/2020 7 year recall:  Due Next due on 07/28/2027 EGD:  Last Dental Exam: 05/2022 follows every 6 mo Last Eye Exam: Yearly last 2023 Last Derm Exam: PRN  Patient Care Team: [name], MD as PCP - General (Internal Medicine)  Surgical History:  He has a past surgical history that includes Wisdom tooth extraction (1976); Nasal septum surgery; Incision and drainage perirectal abscess; Melanoma excision (2009); Hernia repair (Left, groin); Hernia repair (Right, groin); Shoulder arthroscopy (Left, 2003); melanona (2009); Colonoscopy (04/13/2010); Tissue graft; Cystoscopy/ureteroscopy/holmium laser/stent placement (Left, 07/13/2021); Transurethral resection of prostate (07/13/2021); and Endovenous ablation saphenous vein w/ laser (Left, 08/08/2021). Family History:  Hisfamily history includes AAA (abdominal aortic aneurysm) in his father and paternal aunt; Aneurysm (age of onset: 54) in his mother; CAD in his father and paternal aunt; Heart attack in his paternal uncle; Kidney disease in his father; Kidney failure in his father; Prostate cancer in his father. Social History:  He reports that he has never smoked. He has never used smokeless tobacco. He  reports that he does not drink alcohol and does not use drugs.  Review of Systems: Review of Systems  Constitutional: Negative.   HENT: Negative.    Eyes: Negative.   Respiratory: Negative.    Cardiovascular: Negative.   Gastrointestinal:  Positive for blood in stool. Negative for abdominal pain, constipation, diarrhea, heartburn, nausea and vomiting.  Genitourinary: Negative.   Musculoskeletal: Negative.   Skin: Negative.   Neurological: Negative.   Psychiatric/Behavioral: Negative.      Physical Exam: Estimated body mass index is 27.49 kg/m as calculated from the following:   Height as of this encounter: 6' 1.5" (1.867 m).   Weight as of this encounter: 211 lb 3.2 oz (95.8 kg). BP 138/82   Pulse 76   Temp 97.7 F (36.5 C)   Ht 6' 1.5" (1.867 m)   Wt 211 lb 3.2 oz (95.8 kg)   SpO2 99%   BMI 27.49 kg/m   General Appearance: Well nourished, well developed, in no apparent distress.  Eyes: PERRLA, EOMs, conjunctiva no swelling or erythema, normal fundi and vessels.  Sinuses: No Frontal/maxillary tenderness  ENT/Mouth: Ext aud canals clear, normal light reflex with TMs without erythema, bulging. Good dentition. No erythema, swelling, or exudate on post pharynx. Tonsils not swollen or erythematous. Hearing normal.  Neck: Supple, thyroid normal. No bruits  Respiratory: Respiratory effort normal, BS equal bilaterally without rales, rhonchi, wheezing or stridor.  Cardio: RRR without murmurs, rubs or gallops. Brisk peripheral pulses without edema.  Chest: symmetric, with normal excursions and percussion.  Abdomen: Soft, nontender, no guarding, rebound, hernias, masses, or organomegaly.  Lymphatics: Non tender without lymphadenopathy.  Musculoskeletal: Full ROM all peripheral extremities,5/5 strength, and normal gait.  Skin: Warm, dry without rashes, lesions, ecchymosis. Neuro: Cranial nerves intact, reflexes equal bilaterally. Normal muscle tone, no cerebellar symptoms. Sensation  intact.  Psych: Awake and oriented X 3, normal affect, Insight and Judgment appropriate.  Genitourinary: Defer to Urology  EKG: WNL no ST changes. AORTA SCAN: WNL   Darrol Jump, NP 10:09 AM [facility]

## 2022-07-09 ENCOUNTER — Ambulatory Visit: Payer: BC Managed Care – PPO | Admitting: Podiatry

## 2022-07-09 ENCOUNTER — Encounter: Payer: Self-pay | Admitting: Podiatry

## 2022-07-09 DIAGNOSIS — M2041 Other hammer toe(s) (acquired), right foot: Secondary | ICD-10-CM

## 2022-07-09 DIAGNOSIS — M2042 Other hammer toe(s) (acquired), left foot: Secondary | ICD-10-CM | POA: Diagnosis not present

## 2022-07-09 DIAGNOSIS — L603 Nail dystrophy: Secondary | ICD-10-CM | POA: Diagnosis not present

## 2022-07-10 ENCOUNTER — Other Ambulatory Visit: Payer: Self-pay | Admitting: Nurse Practitioner

## 2022-07-10 DIAGNOSIS — D649 Anemia, unspecified: Secondary | ICD-10-CM

## 2022-07-10 DIAGNOSIS — D508 Other iron deficiency anemias: Secondary | ICD-10-CM

## 2022-07-11 LAB — URINALYSIS, ROUTINE W REFLEX MICROSCOPIC
Bacteria, UA: NONE SEEN /HPF
Bilirubin Urine: NEGATIVE
Glucose, UA: NEGATIVE
Hgb urine dipstick: NEGATIVE
Hyaline Cast: NONE SEEN /LPF
Ketones, ur: NEGATIVE
Nitrite: NEGATIVE
Protein, ur: NEGATIVE
RBC / HPF: NONE SEEN /HPF (ref 0–2)
Specific Gravity, Urine: 1.023 (ref 1.001–1.035)
pH: 5.5 (ref 5.0–8.0)

## 2022-07-11 LAB — COMPLETE METABOLIC PANEL WITH GFR
AG Ratio: 2.2 (calc) (ref 1.0–2.5)
ALT: 17 U/L (ref 9–46)
AST: 20 U/L (ref 10–35)
Albumin: 4.4 g/dL (ref 3.6–5.1)
Alkaline phosphatase (APISO): 32 U/L — ABNORMAL LOW (ref 35–144)
BUN/Creatinine Ratio: 22 (calc) (ref 6–22)
BUN: 28 mg/dL — ABNORMAL HIGH (ref 7–25)
CO2: 30 mmol/L (ref 20–32)
Calcium: 9.7 mg/dL (ref 8.6–10.3)
Chloride: 106 mmol/L (ref 98–110)
Creat: 1.25 mg/dL (ref 0.70–1.35)
Globulin: 2 g/dL (calc) (ref 1.9–3.7)
Glucose, Bld: 111 mg/dL — ABNORMAL HIGH (ref 65–99)
Potassium: 4.9 mmol/L (ref 3.5–5.3)
Sodium: 141 mmol/L (ref 135–146)
Total Bilirubin: 0.3 mg/dL (ref 0.2–1.2)
Total Protein: 6.4 g/dL (ref 6.1–8.1)
eGFR: 65 mL/min/{1.73_m2} (ref >=60)

## 2022-07-11 LAB — LIPID PANEL
Cholesterol: 136 mg/dL (ref <200)
HDL: 43 mg/dL (ref >=40)
LDL Cholesterol (Calc): 69 mg/dL (calc)
Non-HDL Cholesterol (Calc): 93 mg/dL (calc) (ref <130)
Total CHOL/HDL Ratio: 3.2 (calc) (ref <5.0)
Triglycerides: 160 mg/dL — ABNORMAL HIGH (ref <150)

## 2022-07-11 LAB — INSULIN, RANDOM: Insulin: 21.6 u[IU]/mL — ABNORMAL HIGH

## 2022-07-11 LAB — CBC WITH DIFFERENTIAL/PLATELET
Absolute Monocytes: 568 cells/uL (ref 200–950)
Basophils Absolute: 58 cells/uL (ref 0–200)
Basophils Relative: 1 %
Eosinophils Absolute: 139 cells/uL (ref 15–500)
Eosinophils Relative: 2.4 %
HCT: 28 % — ABNORMAL LOW (ref 38.5–50.0)
Hemoglobin: 9.3 g/dL — ABNORMAL LOW (ref 13.2–17.1)
Lymphs Abs: 1398 cells/uL (ref 850–3900)
MCH: 31.1 pg (ref 27.0–33.0)
MCHC: 33.2 g/dL (ref 32.0–36.0)
MCV: 93.6 fL (ref 80.0–100.0)
MPV: 9.8 fL (ref 7.5–12.5)
Monocytes Relative: 9.8 %
Neutro Abs: 3637 cells/uL (ref 1500–7800)
Neutrophils Relative %: 62.7 %
Platelets: 348 10*3/uL (ref 140–400)
RBC: 2.99 10*6/uL — ABNORMAL LOW (ref 4.20–5.80)
RDW: 11.9 % (ref 11.0–15.0)
Total Lymphocyte: 24.1 %
WBC: 5.8 10*3/uL (ref 3.8–10.8)

## 2022-07-11 LAB — TEST AUTHORIZATION

## 2022-07-11 LAB — MAGNESIUM: Magnesium: 2.1 mg/dL (ref 1.5–2.5)

## 2022-07-11 LAB — IRON,TIBC AND FERRITIN PANEL
%SAT: 19 % (calc) — ABNORMAL LOW (ref 20–48)
Ferritin: 112 ng/mL (ref 24–380)
Iron: 99 ug/dL (ref 50–180)
TIBC: 530 mcg/dL (calc) — ABNORMAL HIGH (ref 250–425)

## 2022-07-11 LAB — HEMOGLOBIN A1C
Hgb A1c MFr Bld: 5.6 % of total Hgb (ref <5.7)
Mean Plasma Glucose: 114 mg/dL
eAG (mmol/L): 6.3 mmol/L

## 2022-07-11 LAB — PSA: PSA: 0.32 ng/mL (ref <=4.00)

## 2022-07-11 LAB — MICROSCOPIC MESSAGE

## 2022-07-11 LAB — VITAMIN D 25 HYDROXY (VIT D DEFICIENCY, FRACTURES): Vit D, 25-Hydroxy: 75 ng/mL (ref 30–100)

## 2022-07-11 LAB — TSH: TSH: 2.19 mIU/L (ref 0.40–4.50)

## 2022-07-11 NOTE — Progress Notes (Signed)
  Subjective:  Patient ID: Tony Bailey, male    DOB: December 05, 1960,  MRN: [id number]  Chief Complaint  Patient presents with   Nail Problem    New patient - thick painful toenails    61 y.o. male presents with the above complaint. History confirmed with patient.  There are some discoloration of the nails as well, the most notable is the right second toe  Objective:  Physical Exam: warm, good capillary refill, no trophic changes or ulcerative lesions, normal DP and PT pulses, and normal sensory exam.  Discoloration of right second toe with thickening and dystrophy.  Reducible hammertoe deformities.  Mild hallux valgus deformity  Assessment:   1. Nail dystrophy      Plan:  Patient was evaluated and treated and all questions answered.  Discussed etiology treatment options of nail dystrophy and thickening and yellow discoloration.  Possible this is nail fungus versus nail dystrophy.  Culture of the nail plate was taken today.  We discussed how diet dystrophy can result from hammertoe deformities.  I will let him know the results of the culture once we have this and then decide on further treatment options from there  No follow-ups on file.

## 2022-08-05 ENCOUNTER — Other Ambulatory Visit: Payer: Self-pay

## 2022-08-05 ENCOUNTER — Encounter: Payer: Self-pay | Admitting: Podiatry

## 2022-08-05 DIAGNOSIS — Z1211 Encounter for screening for malignant neoplasm of colon: Secondary | ICD-10-CM

## 2022-08-05 LAB — POC HEMOCCULT BLD/STL (HOME/3-CARD/SCREEN)
Card #2 Fecal Occult Blod, POC: NEGATIVE
Card #3 Fecal Occult Blood, POC: NEGATIVE
Fecal Occult Blood, POC: NEGATIVE

## 2022-08-06 DIAGNOSIS — Z1211 Encounter for screening for malignant neoplasm of colon: Secondary | ICD-10-CM | POA: Diagnosis not present

## 2022-08-06 DIAGNOSIS — Z1212 Encounter for screening for malignant neoplasm of rectum: Secondary | ICD-10-CM | POA: Diagnosis not present

## 2022-08-07 ENCOUNTER — Ambulatory Visit (INDEPENDENT_AMBULATORY_CARE_PROVIDER_SITE_OTHER): Payer: BC Managed Care – PPO | Admitting: Neurology

## 2022-08-07 ENCOUNTER — Encounter: Payer: Self-pay | Admitting: Neurology

## 2022-08-07 VITALS — BP 149/88 | HR 41 | Ht 73.5 in | Wt 213.0 lb

## 2022-08-07 DIAGNOSIS — E663 Overweight: Secondary | ICD-10-CM | POA: Diagnosis not present

## 2022-08-07 DIAGNOSIS — R7309 Other abnormal glucose: Secondary | ICD-10-CM | POA: Diagnosis not present

## 2022-08-07 DIAGNOSIS — I10 Essential (primary) hypertension: Secondary | ICD-10-CM

## 2022-08-07 DIAGNOSIS — I700 Atherosclerosis of aorta: Secondary | ICD-10-CM | POA: Diagnosis not present

## 2022-08-07 DIAGNOSIS — G4733 Obstructive sleep apnea (adult) (pediatric): Secondary | ICD-10-CM

## 2022-08-07 NOTE — Progress Notes (Signed)
SLEEP MEDICINE CLINIC    Provider:  Larey Seat, MD  Primary Care Physician:  [name], MD [address] [place] [address]     Referring Provider: Darrol Jump, [place],  [place] [address]          Chief Complaint according to patient   Patient presents with:     New Patient (Initial Visit)     Pt with wife, rm 5. Here to establish care with CPAP. He had a SS in 2006 and was set up with a machine in 2006. DME [employer]. He has done well with the machine and has not needed anything. His current machine is [employer] and we are unable to get the reading from the machine.      HISTORY OF PRESENT ILLNESS:  Tony Bailey is a 61 y.o. year old White or Caucasian male patient seen here as a referral on 08/07/2022 from Dr Jones Broom, MD - and his NP, for a sleep consultation. .  Chief concern according to patient :  patient uses a CPAP since 2006, still on his first machine.REM STAR [employer].    Tony Bailey   has a past medical history of Anesthesia complication, Anxiety, Bleeding from varicose vein, Hyperlipidemia, Hypertension, Kidney stones, Melanoma ([place]) (2009), OSA (obstructive sleep apnea),  Prediabetes (05/09/2019), Sciatic nerve pain (2010), Testosterone deficiency (10/13/2013), Unspecified vitamin D deficiency, and Venous insufficiency.   The patient had the first sleep study in the year 2006, the physician had left the community and so the patient had no follow up. [employer] gives supplies.  Sleep relevant medical history:  deviated septum repair, prostate ectomy TURP, Kidney stones,     Family medical /sleep history: father likely had OSA- passed at 37 , in 2010- CHF, CAD, Anemia,  ESRD.    Social history:  Patient is working as a Art gallery manager- and lives in a household with spouse and dog-  no children.  The patient currently works daytime.  Tobacco use; none .  ETOH use - none   Caffeine intake  in form of Coffee( 1-2 ) Soda( diet 1-2 day) Tea ( 1  day) -no energy drinks. Regular exercise in form of  walking, gardening. .        Sleep habits are as follows: The patient's dinner time is between 5.30-6 PM. The patient goes to bed at 11 PM but he may have slept already in front of the TV. He  continues to sleep for 7 hours, wakes for one bathroom breaks, the first time at 2-3 AM.   The preferred sleep position is left laterally, with the support of 1 pillow, flat bed-. Dreams are reportedly frequent/vivid.  6.45 AM is the usual rise time. The patient wakes up at 6. 20 with an alarm.  He reports  feeling refreshed/ restored in AM, as long as he uses his CPAP- before CPAP he had symptoms such as dry mouth/ drooling, vertigo, loud snoring, no morning headaches but  residual fatigue.  Naps are taken infrequently, lasting from 15 to 30 minutes in late PM, in front of the TV.    Review of Systems: Out of a complete 14 system review, the patient complains of only the following symptoms, and all other reviewed systems are negative.:     How likely are you to doze in the following situations: 0 = not likely, 1 = slight chance, 2 = moderate chance, 3 =  high chance   Sitting and Reading? Watching Television? Sitting inactive in a public place (theater or meeting)? As a passenger in a car for an hour without a break? Lying down in the afternoon when circumstances permit? Sitting and talking to someone? Sitting quietly after lunch without alcohol? In a car, while stopped for a few minutes in traffic?   Total = 14/ 24 points   FSS endorsed at 18/ 63 points.  GDS -1/15  Social History   Socioeconomic History   Marital status: Married    Spouse name: Not on file   Number of children: Not on file   Years of education: Not on file   Highest education level: Not on file  Occupational History   Not on file  Tobacco Use   Smoking status: Never   Smokeless tobacco: Never  Vaping Use    Vaping Use: Never used  Substance and Sexual Activity   Alcohol use: No   Drug use: No   Sexual activity: Yes    Partners: Female    Birth control/protection: Post-menopausal  Other Topics Concern   Not on file  Social History Narrative   Not on file   Social Determinants of Health   Financial Resource Strain: Not on file  Food Insecurity: Not on file  Transportation Needs: Not on file  Physical Activity: Not on file  Stress: Not on file  Social Connections: Not on file    Family History  Problem Relation Age of Onset   Aneurysm Mother 85   AAA (abdominal aortic aneurysm) Father    Kidney disease Father    Kidney failure Father    Prostate cancer Father    CAD Father    AAA (abdominal aortic aneurysm) Paternal Aunt    CAD Paternal Aunt    Heart attack Paternal Uncle        Several paternal uncles with CAD hx   Colon cancer Neg Hx    Stomach cancer Neg Hx    Rectal cancer Neg Hx     Past Medical History:  Diagnosis Date   Anesthesia complication    Pt stated that years ago when he was having surgery for a perirectal abcess his heart stopped. He stated that he was told it was due to the position he was in and the anesthesia going to his heart. He said they were able to get him back quickly. Since then he has had surgeries with no complications.   Anxiety    Bleeding from varicose vein    Hyperlipidemia    Hypertension    Kidney stones    passed stones   Melanoma ([place]) 2009   upper back mid right side   OSA (obstructive sleep apnea)    Other testicular hypofunction    Prediabetes 05/09/2019   Sciatic nerve pain 2010   hx - no current problems per patient 07/06/20   Sleep apnea    uses cpap nightly   Testosterone deficiency 10/13/2013   Unspecified vitamin D deficiency    Venous insufficiency    bilateral    Past Surgical History:  Procedure Laterality Date   COLONOSCOPY  04/13/2010   [facility]    CYSTOSCOPY/URETEROSCOPY/HOLMIUM LASER/STENT PLACEMENT  Left 07/13/2021   Procedure: CYSTOSCOPY BILATERAL RETROGRADE PYELOGRAM/CYSTOLITHALOPAXY;  Surgeon: Alexis Frock, MD;  Location: [facility];  Service: Urology;  Laterality: Left;   ENDOVENOUS ABLATION SAPHENOUS VEIN W/ LASER Left 08/08/2021   endovenous laser ablation left greater saphenous veins and stab phlebectomy > 20  incisions left leg by Gae Gallop MD   HERNIA REPAIR Left groin   1970   HERNIA REPAIR Right groin   2011   INCISION AND DRAINAGE PERIRECTAL ABSCESS     x 2   MELANOMA EXCISION  2009   melanona  2009   upper back -mid right side   NASAL SEPTUM SURGERY     SHOULDER ARTHROSCOPY Left 2003   TISSUE GRAFT     hx of gum grafts   TRANSURETHRAL RESECTION OF PROSTATE  07/13/2021   Procedure: TRANSURETHRAL RESECTION OF THE PROSTATE (TURP);  Surgeon: Alexis Frock, MD;  Location: [facility];  Service: Urology;;   [place]     Current Outpatient Medications on File Prior to Visit  Medication Sig Dispense Refill   aspirin EC 81 MG tablet Take 81 mg by mouth daily. Swallow whole.     bisoprolol-hydrochlorothiazide (ZIAC) 5-6.25 MG tablet Take  1 tablet  Daily for BP                                            /                    TAKE 1 TABLET BY MOUTH 90 tablet 3   Cholecalciferol (VITAMIN D-3 PO) Take 10,000 Units by mouth daily.      Cinnamon 500 MG capsule Take 100 mg by mouth 2 (two) times daily.     fenofibrate (TRICOR) 145 MG tablet TAKE 1 TABLET BY MOUTH DAILY FOR TRIGLYCERIDES 90 tablet 2   finasteride (PROSCAR) 5 MG tablet      fluticasone (FLONASE) 50 MCG/ACT nasal spray SHAKE LIQUID AND USE 1 TO 2 SPRAYS IN EACH NOSTRIL 1 TO 2 TIMES PER DAY 48 g 3   Multiple Vitamin (MULTIVITAMIN) tablet Take 1 tablet by mouth daily.     Omega-3 Fatty Acids (FISH OIL PO) Take 2,400 mg by mouth 2 (two) times daily.      rosuvastatin (CRESTOR) 40 MG tablet Take 1 tablet (40 mg total) by mouth daily. 90 tablet 3   zinc gluconate  50 MG tablet Take 50 mg by mouth daily.     No current facility-administered medications on file prior to visit.    No Known Allergies  Physical exam:  Today's Vitals   08/07/22 1104  BP: (!) 149/88  Pulse: (!) 41  Weight: 213 lb (96.6 kg)  Height: 6' 1.5" (1.867 m)   Body mass index is 27.72 kg/m.   Wt Readings from Last 3 Encounters:  08/07/22 213 lb (96.6 kg)  07/08/22 211 lb 3.2 oz (95.8 kg)  05/01/22 212 lb (96.2 kg)     Ht Readings from Last 3 Encounters:  08/07/22 6' 1.5" (1.867 m)  07/08/22 6' 1.5" (1.867 m)  05/01/22 '6\' 2"'$  (1.88 m)      General: The patient is awake, alert and appears not in acute distress. The patient is well groomed. Head: Normocephalic, atraumatic. Neck is supple. Mallampati 2-3,  neck circumference:17 inches . Nasal airflow  patent.  Retrognathia is not seen.  Dental status:biological  Cardiovascular:  Regular rate and cardiac rhythm by pulse,  without distended neck veins. Respiratory: Lungs are clear to auscultation.  Skin:  Without evidence of ankle edema, or rash. Trunk: The patient's posture is erect.   Neurologic exam : The patient is awake  and alert, oriented to place and time.   Memory subjective described as intact.  Attention span & concentration ability appears normal.  Speech is fluent,  without  dysarthria, dysphonia or aphasia.  Mood and affect are appropriate.   Cranial nerves: no loss of smell or taste reported  Pupils are equal and briskly reactive to light. Funduscopic exam deferred. .  Extraocular movements in vertical and horizontal planes were intact and without nystagmus. No Diplopia. Visual fields by finger perimetry are intact. Hearing was intact to soft voice and finger rubbing.    Facial sensation intact to fine touch.  Facial motor strength is symmetric and tongue and uvula move midline.  Neck ROM : rotation, tilt and flexion extension were normal for age and shoulder shrug was symmetrical.    Motor exam:   Symmetric bulk, tone and ROM.   Normal tone without cog wheeling, symmetric grip strength .   Sensory:  Fine touch and vibration were tested  and  normal.  Proprioception tested in the upper extremities was normal.   Coordination: Rapid alternating movements in the fingers/hands were of normal speed.  The Finger-to-nose maneuver was intact without evidence of ataxia, dysmetria or tremor.   Gait and station: Patient could rise unassisted from a seated position, walked without assistive device.  Stance is of normal width/ base and the patient turned with  steps.  Toe and heel walk were deferred.  Deep tendon reflexes: in the  upper and lower extremities are symmetric and intact.  Babinski response was deferred .       After spending a total time of  40  minutes face to face and additional time for physical and neurologic examination, review of laboratory studies,  personal review of imaging studies, reports and results of other testing and review of referral information / records as far as provided in visit, I have established the following assessments:  I had the pleasure of meeting Mr. Delellis today who is accompanied by his spouse.  He has been a compliant CPAP user since 2006, on the same [employer].  He reports no difficulties initially tolerating and actually enjoying the benefits of CPAP therapy.  His wife reports that he was a very loud snorer before positive airway pressure was initiated.  At this time it is hard for him to remember how he felt prior to therapy initiation but he is a CPAP dependent patient who does not feel that he gets the same quality of sleep and rest if he does not use his CPAP and he has been highly compliant.  He is followed by a local medical equipment company [employer] branch.  He endorsed some daytime sleepiness but not a significant degree of fatigue.  His depression score was negative.  1) my goal is to establish a new baseline for this  patient and he is invited to continue using his CPAP until it will be replaced.  I also stated that he can use it as a travel CPAP from now on.  He likes [employer] and we can certainly order a [employer] machine for him.  I will ask his insurance company to allow as a split-night polysomnography which would mean that after 2 hours of sleep establishing a baseline in the process he would be titrated to CPAP in the lab.  If this is not authorized by his insurance carrier I would offer a home sleep test.  This home sleep test would mean that the screen for apnea  only if he will not see cardiac arrhythmias, restless legs, any kind of significant sleep architecture changes..   My Plan is to proceed with:  1) SPLIT night PSG AHI 10/ h  2) Plan B is HST .  3) patient 's CPAP settings are not known. I would order a [employer]  auto CPAP with a setting from 5-12 cm water, easy breathe on.    I would like to thank [name], MD and Darrol Jump, [address] [place],  [place] [address] for allowing me to meet with and to take care of this pleasant patient.   In short, Tony Bailey is presenting with treated OSA. I plan to follow up either personally or through our NP within 2-4 month.   CC: I will share my notes with PCP .  Electronically signed by: Larey Seat, MD 08/07/2022 11:36 AM  [facility] Board certified by The [employer] of Sleep Medicine and Diplomate of the [employer] of Sleep Medicine. Board certified In Neurology through the [place], Fellow of the [employer] of Neurology. Medical Director of [facility].

## 2022-08-07 NOTE — Patient Instructions (Signed)
Healthy Living: Sleep In this video, you will learn why sleep is an important part of a healthy lifestyle. To view the content, go to this web address: [email or web address].elsevier.[email or web address]  This video will expire on: 06/18/2024. If you need access to this video following this date, please reach out to the healthcare provider who assigned it to you. This information is not intended to replace advice given to you by your health care provider. Make sure you discuss any questions you have with your health care provider. Elsevier Patient Education  2023 Elsevier Inc.  

## 2022-08-21 ENCOUNTER — Encounter: Payer: Self-pay | Admitting: Podiatry

## 2022-09-02 ENCOUNTER — Other Ambulatory Visit: Payer: Self-pay

## 2022-09-02 DIAGNOSIS — E782 Mixed hyperlipidemia: Secondary | ICD-10-CM

## 2022-09-02 MED ORDER — FENOFIBRATE 145 MG PO TABS: ORAL_TABLET | ORAL | 2 refills | Status: DC

## 2022-09-10 ENCOUNTER — Encounter: Payer: Self-pay | Admitting: Neurology

## 2022-09-12 NOTE — Telephone Encounter (Signed)
Pt calling to check on status of scheduling Sleep Study. Would like a call back.

## 2022-09-19 NOTE — Telephone Encounter (Signed)
Patient called back.  He chose to have the split.Marland Kitchen  Split- [employer] no auth req spoke to [place] ref # [id number]   Patient is scheduled at [facility] for 11/12/22 at 9 pm.  He did request to put him on the cancellation list and I did.  Mailed packet to the patient.

## 2022-09-25 ENCOUNTER — Encounter: Payer: Self-pay | Admitting: Nurse Practitioner

## 2022-09-25 ENCOUNTER — Ambulatory Visit (INDEPENDENT_AMBULATORY_CARE_PROVIDER_SITE_OTHER): Payer: BC Managed Care – PPO | Admitting: Nurse Practitioner

## 2022-09-25 ENCOUNTER — Other Ambulatory Visit: Payer: Self-pay

## 2022-09-25 VITALS — BP 138/78 | HR 67 | Temp 97.2°F | Ht 73.5 in | Wt 214.8 lb

## 2022-09-25 DIAGNOSIS — J0100 Acute maxillary sinusitis, unspecified: Secondary | ICD-10-CM | POA: Diagnosis not present

## 2022-09-25 DIAGNOSIS — R6889 Other general symptoms and signs: Secondary | ICD-10-CM

## 2022-09-25 DIAGNOSIS — J029 Acute pharyngitis, unspecified: Secondary | ICD-10-CM | POA: Diagnosis not present

## 2022-09-25 DIAGNOSIS — Z1152 Encounter for screening for COVID-19: Secondary | ICD-10-CM | POA: Diagnosis not present

## 2022-09-25 DIAGNOSIS — R051 Acute cough: Secondary | ICD-10-CM | POA: Diagnosis not present

## 2022-09-25 LAB — POCT INFLUENZA A/B
Influenza A, POC: NEGATIVE
Influenza B, POC: NEGATIVE

## 2022-09-25 LAB — POC COVID19 BINAXNOW: SARS Coronavirus 2 Ag: NEGATIVE

## 2022-09-25 MED ORDER — PROMETHAZINE-DM 6.25-15 MG/5ML PO SYRP: 5.0000 mL | ORAL_SOLUTION | Freq: Four times a day (QID) | ORAL | 0 refills | Status: DC | PRN

## 2022-09-25 MED ORDER — AZITHROMYCIN 500 MG PO TABS: 500.0000 mg | ORAL_TABLET | Freq: Every day | ORAL | 0 refills | Status: AC

## 2022-09-25 NOTE — Progress Notes (Signed)
Assessment and Plan:  Tony Bailey was seen today for an episodic visit.  Diagnoses and all order for this visit:  1. Flu-like symptoms Negative  - POCT Influenza A/B  2. Encounter for screening for COVID-19 Negative  - POC COVID-19  3. Acute non-recurrent maxillary sinusitis Start tmt with abx. Continue to stay well hydrated.   - azithromycin (ZITHROMAX) 500 MG tablet; Take 1 tablet (500 mg total) by mouth daily for 10 days. Take 1 tablet daily for 3 days.  Dispense: 10 tablet; Refill: 0  4. Acute cough  - promethazine-dextromethorphan (PROMETHAZINE-DM) 6.25-15 MG/5ML syrup; Take 5 mLs by mouth 4 (four) times daily as needed for cough.  Dispense: 240 mL; Refill: 0  5. Sore throat Warm salt water gargles several times throughout the day Throat lozenges as needed.  Notify office for further evaluation and treatment, questions or concerns if s/s fail to improve. The risks and benefits of my recommendations, as well as other treatment options were discussed with the patient today. Questions were answered.  Further disposition pending results of labs. Discussed med's effects and SE's.    Over 15 minutes of exam, counseling, chart review, and critical decision making was performed.   Future Appointments  Date Time Provider [place]  10/10/2022 10:30 AM Darrol Jump, NP [facility] None  11/12/2022  9:00 PM [facility] None  07/09/2023  9:00 AM Rabecca Birge, NP [facility] None    ------------------------------------------------------------------------------------------------------------------   HPI BP 138/78   Pulse 67   Temp (!) 97.2 F (36.2 C)   Ht 6' 1.5" (1.867 m)   Wt 214 lb 12.8 oz (97.4 kg)   SpO2 97%   BMI 27.96 kg/m   Patient complains of congestion, cough, facial pain, sinus pressure, sneezing, and sore throat. Onset of symptoms was 1 week ago. Symptoms have been gradually worsening since that time. He is not drinking much.   Past history is significant for  OSA . Patient is non-smoker.  Past Medical History:  Diagnosis Date   Anesthesia complication    Pt stated that years ago when he was having surgery for a perirectal abcess his heart stopped. He stated that he was told it was due to the position he was in and the anesthesia going to his heart. He said they were able to get him back quickly. Since then he has had surgeries with no complications.   Anxiety    Bleeding from varicose vein    Hyperlipidemia    Hypertension    Kidney stones    passed stones   Melanoma ([place]) 2009   upper back mid right side   OSA (obstructive sleep apnea)    Other testicular hypofunction    Prediabetes 05/09/2019   Sciatic nerve pain 2010   hx - no current problems per patient 07/06/20   Sleep apnea    uses cpap nightly   Testosterone deficiency 10/13/2013   Unspecified vitamin D deficiency    Venous insufficiency    bilateral     No Known Allergies  Current Outpatient Medications on File Prior to Visit  Medication Sig   aspirin EC 81 MG tablet Take 81 mg by mouth daily. Swallow whole.   bisoprolol-hydrochlorothiazide (ZIAC) 5-6.25 MG tablet Take  1 tablet  Daily for BP                                            /  TAKE 1 TABLET BY MOUTH   Cholecalciferol (VITAMIN D-3 PO) Take 10,000 Units by mouth daily.    Cinnamon 500 MG capsule Take 100 mg by mouth 2 (two) times daily.   fenofibrate (TRICOR) 145 MG tablet TAKE 1 TABLET BY MOUTH DAILY FOR TRIGLYCERIDES   finasteride (PROSCAR) 5 MG tablet    fluticasone (FLONASE) 50 MCG/ACT nasal spray SHAKE LIQUID AND USE 1 TO 2 SPRAYS IN EACH NOSTRIL 1 TO 2 TIMES PER DAY   Multiple Vitamin (MULTIVITAMIN) tablet Take 1 tablet by mouth daily.   Omega-3 Fatty Acids (FISH OIL PO) Take 2,400 mg by mouth 2 (two) times daily.    rosuvastatin (CRESTOR) 40 MG tablet Take 1 tablet (40 mg total) by mouth daily.   zinc gluconate 50 MG tablet Take 50 mg by mouth daily.    No current facility-administered medications on file prior to visit.    ROS: all negative except what is noted in the HPI.   Physical Exam:  BP 138/78   Pulse 67   Temp (!) 97.2 F (36.2 C)   Ht 6' 1.5" (1.867 m)   Wt 214 lb 12.8 oz (97.4 kg)   SpO2 97%   BMI 27.96 kg/m   General Appearance: NAD.  Awake, conversant and cooperative. Eyes: PERRLA, EOMs intact.  Sclera white.  Conjunctiva without erythema. Sinuses: Frontal/maxillary tenderness.  No nasal discharge. Nares patent.  ENT/Mouth: Ext aud canals clear.  Bilateral TMs w/DOL and without erythema or bulging. Hearing intact.  Posterior pharynx without swelling or exudate.  Tonsils without swelling or erythema.  Neck: Supple.  No masses, nodules or thyromegaly. Respiratory: Effort is regular with non-labored breathing. Breath sounds are equal bilaterally without rales, rhonchi, wheezing or stridor.  Cardio: RRR with no MRGs. Brisk peripheral pulses without edema.  Abdomen: Active BS in all four quadrants.  Soft and non-tender without guarding, rebound tenderness, hernias or masses. Lymphatics: Non tender without lymphadenopathy.  Musculoskeletal: Full ROM, 5/5 strength, normal ambulation.  No clubbing or cyanosis. Skin: Appropriate color for ethnicity. Warm without rashes, lesions, ecchymosis, ulcers.  Neuro: CN II-XII grossly normal. Normal muscle tone without cerebellar symptoms and intact sensation.   Psych: AO X 3,  appropriate mood and affect, insight and judgment.     Darrol Jump, NP 4:54 PM [facility]

## 2022-09-29 NOTE — Patient Instructions (Signed)

## 2022-10-09 ENCOUNTER — Other Ambulatory Visit: Payer: Self-pay | Admitting: Urology

## 2022-10-10 ENCOUNTER — Ambulatory Visit (INDEPENDENT_AMBULATORY_CARE_PROVIDER_SITE_OTHER): Payer: BC Managed Care – PPO | Admitting: Nurse Practitioner

## 2022-10-10 VITALS — BP 160/90 | HR 66 | Temp 97.2°F | Ht 73.5 in | Wt 215.0 lb

## 2022-10-10 DIAGNOSIS — G4733 Obstructive sleep apnea (adult) (pediatric): Secondary | ICD-10-CM

## 2022-10-10 DIAGNOSIS — E663 Overweight: Secondary | ICD-10-CM

## 2022-10-10 DIAGNOSIS — R7309 Other abnormal glucose: Secondary | ICD-10-CM

## 2022-10-10 DIAGNOSIS — N138 Other obstructive and reflux uropathy: Secondary | ICD-10-CM

## 2022-10-10 DIAGNOSIS — B351 Tinea unguium: Secondary | ICD-10-CM

## 2022-10-10 DIAGNOSIS — N401 Enlarged prostate with lower urinary tract symptoms: Secondary | ICD-10-CM | POA: Diagnosis not present

## 2022-10-10 DIAGNOSIS — I10 Essential (primary) hypertension: Secondary | ICD-10-CM | POA: Diagnosis not present

## 2022-10-10 DIAGNOSIS — N200 Calculus of kidney: Secondary | ICD-10-CM

## 2022-10-10 DIAGNOSIS — Z79899 Other long term (current) drug therapy: Secondary | ICD-10-CM

## 2022-10-10 DIAGNOSIS — E782 Mixed hyperlipidemia: Secondary | ICD-10-CM

## 2022-10-10 DIAGNOSIS — K439 Ventral hernia without obstruction or gangrene: Secondary | ICD-10-CM

## 2022-10-10 MED ORDER — CLINDAMYCIN PHOSPHATE 1 % EX GEL: Freq: Two times a day (BID) | CUTANEOUS | 3 refills | Status: DC

## 2022-10-10 MED ORDER — TRIAMCINOLONE ACETONIDE 0.1 % EX OINT: 1.0000 | TOPICAL_OINTMENT | Freq: Two times a day (BID) | CUTANEOUS | 1 refills | Status: DC

## 2022-10-10 NOTE — Progress Notes (Signed)
Follow Up  Assessment and Plan:  Essential hypertension Controlled Continue bASA, bisoprolol-hctz,  Discussed DASH (Dietary Approaches to Stop Hypertension) DASH diet is lower in sodium than a typical American diet. Cut back on foods that are high in saturated fat, cholesterol, and trans fats. Eat more whole-grain foods, fish, poultry, and nuts Remain active and exercise as tolerated daily.  Monitor BP at home-Call if greater than 130/80.  Check CMP/CBC   OSA on CPAP Continue CPAP.  Continue updated Sleep study scheduled for 11/12/22  BPH with obstruction/lower urinary tract symptoms Controlled. Continue Proscar  Continue to monitor Check PSA during CPE  Hyperlipidemia, mixed Continue fenofibrate, rosuvastatin Discussed lifestyle modifications. Recommended diet heavy in fruits and veggies, omega 3's. Decrease consumption of animal meats, cheeses, and dairy products. Remain active and exercise as tolerated. Continue to monitor. Check lipids/TSH  Abnormal glucose (prediabetes) Education: Reviewed 'ABCs' of diabetes management  Discussed goals to be met and/or maintained include A1C (<7) Blood pressure (<130/80) Cholesterol (LDL <70) Continue Eye Exam yearly  Continue Dental Exam Q6 mo Discussed dietary recommendations Discussed Physical Activity recommendations Check A1C   Medication management All medications discussed and reviewed in full. All questions and concerns regarding medications addressed.     Overweight (BMI 25.0-29.9) Discussed appropriate BMI Diet modification. Physical activity. Encouraged/praised to build confidence.   Onychomycosis Ruled out noted to have nail dystrophy Continue to follow with Podiatry PRN  Ventral Hernia  Ruled ou notdd to have rectus diastasis with an umbilical hernia  Nephrolithiasis Attempt to remove stone unsuccessful Rescheduled for 10/18/2022 - Dr. Tresa Moore Continue to follow with Urology  Orders Placed This  Encounter  Procedures   CBC with Differential/Platelet   COMPLETE METABOLIC PANEL WITH GFR   Lipid panel   Hemoglobin A1c     Notify office for further evaluation and treatment, questions or concerns if any reported s/s fail to improve.   The patient was advised to call back or seek an in-person evaluation if any symptoms worsen or if the condition fails to improve as anticipated.   Further disposition pending results of labs. Discussed med's effects and SE's.    I discussed the assessment and treatment plan with the patient. The patient was provided an opportunity to ask questions and all were answered. The patient agreed with the plan and demonstrated an understanding of the instructions.  Discussed med's effects and SE's. Screening labs and tests as requested with regular follow-up as recommended.  I provided 20 minutes of face-to-face time during this encounter including counseling, chart review, and critical decision making was preformed.   Future Appointments  Date Time Provider [place]  10/11/2022 11:00 AM [facility] PAT 4 [facility] None  11/12/2022  9:00 PM [facility] None  07/09/2023  9:00 AM Dajah Fischman, NP [facility] None    HPI  61 y.o. male  presents for a general 3 month follow up. He has Essential hypertension; Hyperlipidemia, mixed; Vitamin D deficiency; OSA on CPAP; Abnormal glucose (prediabetes); Medication management; Overweight (BMI 25.0-29.9); BPH with obstruction/lower urinary tract symptoms; History of adenomatous polyp of colon; Varicose veins of lower extremities with complications, bilateral; Family history of abdominal aortic aneurysm (AAA); Aortic atherosclerosis ([place])- CT 06/27/2021; Hepatic steatosis - CT 06/27/2021; and Colon, diverticulosis on their problem list.  Overall he reports doing well.  Has has of OSA with original sleep study being in [place], 2016.  He has noticed increase in daytime sleepiness.  Has not had  his machine, or equipment checked since  2016. Has new sleep study scheduled w/ Dr. Azucena Fallen 10/2022.   He has been taking Terbinafine for tmt of Onychomycosis.  Not much improvement since completing course. Most notable 2nd digit right toe with is hard and thick and eyllow.  He did have a decline in kidney fx last check 02/2022 while on the medication.  Liver enzymes have remained WNL.  Was referred to podiatry an noted to have nail dystrophy, no onychomycosis.  Saw general surgery for ventral hernia that has now been diagnosed as rectus diastasis with an umbilical hernia.  They also noted left kidney stone at the time which was attempted to be removed by Urology, Dr. Tresa Moore, but was unsuccessful d/t prostate. He has planned to return for second attempt next week.     BMI is Body mass index is 27.98 kg/m., he has been working on diet and exercise. Wt Readings from Last 3 Encounters:  10/10/22 215 lb (97.5 kg)  09/25/22 214 lb 12.8 oz (97.4 kg)  08/07/22 213 lb (96.6 kg)   His blood pressure has been controlled at home, today their BP is    He does workout. He denies chest pain, shortness of breath, dizziness.   He is concerned for large ventral hernia.  Denies pain, N/V, difficulty swallowing.  Has not had worked up or evaluated in the past.   He is on cholesterol medication and denies myalgias. Reports family hx of AAA, in father, Has never been a smoker.  His cholesterol  is at goal. The cholesterol last visit was:   Lab Results  Component Value Date   CHOL 136 07/08/2022   HDL 43 07/08/2022   LDLCALC 69 07/08/2022   TRIG 160 (H) 07/08/2022   CHOLHDL 3.2 07/08/2022    He has been working on diet and exercise for is not, he is on bASA, he is on ACE/ARB and denies polydipsia and polyuria. Last A1C in the office was:  Lab Results  Component Value Date   HGBA1C 5.6 07/08/2022   Last GFR: Lab Results  Component Value Date   EGFR 65 07/08/2022   Patient is on Vitamin D supplement.    Lab Results  Component Value Date   VD25OH 31 07/08/2022     Last PSA was: Lab Results  Component Value Date   PSA 0.32 07/08/2022     Current Medications:  Current Outpatient Medications on File Prior to Visit  Medication Sig Dispense Refill   aspirin EC 81 MG tablet Take 81 mg by mouth daily. Swallow whole.     bisoprolol-hydrochlorothiazide (ZIAC) 5-6.25 MG tablet Take  1 tablet  Daily for BP                                            /                    TAKE 1 TABLET BY MOUTH 90 tablet 3   Cholecalciferol (VITAMIN D-3 PO) Take 10,000 Units by mouth daily.      Cinnamon 500 MG capsule Take 100 mg by mouth 2 (two) times daily.     fenofibrate (TRICOR) 145 MG tablet TAKE 1 TABLET BY MOUTH DAILY FOR TRIGLYCERIDES 90 tablet 2   finasteride (PROSCAR) 5 MG tablet      fluticasone (FLONASE) 50 MCG/ACT nasal spray SHAKE LIQUID AND USE 1 TO 2 SPRAYS IN EACH NOSTRIL  1 TO 2 TIMES PER DAY 48 g 3   Multiple Vitamin (MULTIVITAMIN) tablet Take 1 tablet by mouth daily.     Omega-3 Fatty Acids (FISH OIL PO) Take 2,400 mg by mouth 2 (two) times daily.      promethazine-dextromethorphan (PROMETHAZINE-DM) 6.25-15 MG/5ML syrup Take 5 mLs by mouth 4 (four) times daily as needed for cough. 240 mL 0   rosuvastatin (CRESTOR) 40 MG tablet Take 1 tablet (40 mg total) by mouth daily. 90 tablet 3   zinc gluconate 50 MG tablet Take 50 mg by mouth daily.     No current facility-administered medications on file prior to visit.   Allergies:  No Known Allergies Medical History:  He has Essential hypertension; Hyperlipidemia, mixed; Vitamin D deficiency; OSA on CPAP; Abnormal glucose (prediabetes); Medication management; Overweight (BMI 25.0-29.9); BPH with obstruction/lower urinary tract symptoms; History of adenomatous polyp of colon; Varicose veins of lower extremities with complications, bilateral; Family history of abdominal aortic aneurysm (AAA); Aortic atherosclerosis ([place])- CT 06/27/2021; Hepatic steatosis  - CT 06/27/2021; and Colon, diverticulosis on their problem list.  Health Maintenance:   Immunization History  Administered Date(s) Administered   Influenza Inj Mdck Quad Pf 08/10/2020   Influenza Inj Mdck Quad With Preservative 09/03/2017, 07/28/2018   Influenza Split 08/23/2014   Influenza Whole 08/09/2013   Influenza, Seasonal, Injecte, Preservative Fre 07/30/2016   Influenza-Unspecified 08/01/2019, 08/10/2020, 08/23/2021   PFIZER(Purple Top)SARS-COV-2 Vaccination 12/30/2019, 01/20/2020, 07/31/2020, 03/30/2021, 10/01/2021   PPD Test 08/23/2014, 10/18/2015, 01/29/2017, 04/09/2018, 05/10/2019, 05/17/2020   Pneumococcal Conjugate-13 08/23/2014   Tdap 01/17/2016   Zoster Recombinat (Shingrix) 09/12/2019, 11/20/2019   Health Maintenance  Topic Date Due   COVID-19 Vaccine (6 - 2023-24 season) 06/14/2022   DTaP/Tdap/Td (2 - Td or Tdap) 01/16/2026   COLONOSCOPY (Pts 45-25yr Insurance coverage will need to be confirmed)  07/28/2027   INFLUENZA VACCINE  Completed   Hepatitis C Screening  Completed   Zoster Vaccines- Shingrix  Completed   HPV VACCINES  Aged Out   HIV Screening  Discontinued     Surgical History:  He has a past surgical history that includes Wisdom tooth extraction (1976); Nasal septum surgery; Incision and drainage perirectal abscess; Melanoma excision (2009); Hernia repair (Left, groin); Hernia repair (Right, groin); Shoulder arthroscopy (Left, 2003); melanona (2009); Colonoscopy (04/13/2010); Tissue graft; Cystoscopy/ureteroscopy/holmium laser/stent placement (Left, 07/13/2021); Transurethral resection of prostate (07/13/2021); and Endovenous ablation saphenous vein w/ laser (Left, 08/08/2021). Family History:  Hisfamily history includes AAA (abdominal aortic aneurysm) in his father and paternal aunt; Aneurysm (age of onset: 546 in his mother; CAD in his father and paternal aunt; Heart attack in his paternal uncle; Kidney disease in his father; Kidney failure in his  father; Prostate cancer in his father. Social History:  He reports that he has never smoked. He has never used smokeless tobacco. He reports that he does not drink alcohol and does not use drugs.  Review of Systems: Review of Systems  Constitutional: Negative.   HENT: Negative.    Eyes: Negative.   Respiratory: Negative.    Cardiovascular: Negative.   Gastrointestinal:  Negative for abdominal pain, blood in stool, constipation, diarrhea, heartburn, nausea and vomiting.  Genitourinary: Negative.   Musculoskeletal: Negative.   Skin: Negative.   Neurological: Negative.   Psychiatric/Behavioral: Negative.      Physical Exam: Estimated body mass index is 27.98 kg/m as calculated from the following:   Height as of this encounter: 6' 1.5" (1.867 m).   Weight as of this encounter: 215 lb (  97.5 kg). Pulse 66   Temp (!) 97.2 F (36.2 C)   Ht 6' 1.5" (1.867 m)   Wt 215 lb (97.5 kg)   SpO2 99%   BMI 27.98 kg/m   General Appearance: Well nourished, well developed, in no apparent distress.  Eyes: PERRLA, EOMs, conjunctiva no swelling or erythema, normal fundi and vessels.  Sinuses: No Frontal/maxillary tenderness  ENT/Mouth: Ext aud canals clear, normal light reflex with TMs without erythema, bulging. Good dentition. No erythema, swelling, or exudate on post pharynx. Tonsils not swollen or erythematous. Hearing normal.  Neck: Supple, thyroid normal. No bruits  Respiratory: Respiratory effort normal, BS equal bilaterally without rales, rhonchi, wheezing or stridor.  Cardio: RRR without murmurs, rubs or gallops. Brisk peripheral pulses without edema.  Chest: symmetric, with normal excursions and percussion.  Abdomen: Soft, nontender, no guarding, rebound, hernias, masses, or organomegaly.  Lymphatics: Non tender without lymphadenopathy.  Musculoskeletal: Full ROM all peripheral extremities,5/5 strength, and normal gait.  Skin: Warm, dry without rashes, lesions, ecchymosis. Neuro: Cranial  nerves intact, reflexes equal bilaterally. Normal muscle tone, no cerebellar symptoms. Sensation intact.  Psych: Awake and oriented X 3, normal affect, Insight and Judgment appropriate.  Genitourinary: Defer to Urology   Darrol Jump, NP 11:02 AM [facility]

## 2022-10-10 NOTE — Patient Instructions (Signed)
DUE TO COVID-19 ONLY TWO VISITORS  (aged 63 and older)  ARE ALLOWED TO COME WITH YOU AND STAY IN THE WAITING ROOM ONLY DURING PRE OP AND PROCEDURE.   **NO VISITORS ARE ALLOWED IN THE SHORT STAY AREA OR RECOVERY ROOM!!**  IF YOU WILL BE ADMITTED INTO THE HOSPITAL YOU ARE ALLOWED ONLY FOUR SUPPORT PEOPLE DURING VISITATION HOURS ONLY (7 AM -8PM)   The support person(s) must pass our screening, gel in and out, and wear a mask at all times, including in the patient's room. Patients must also wear a mask when staff or their support person are in the room. Visitors GUEST BADGE MUST BE WORN VISIBLY  One adult visitor may remain with you overnight and MUST be in the room by 8 P.M.     Your procedure is scheduled on: 10/18/21   Report to [facility] Main Entrance    Report to admitting at : 5:15 AM   Call this number if you have problems the morning of surgery [phone]   Do not eat food :After [place] is also important to reduce your risk of infection.                                    Remember - BRUSH YOUR TEETH THE MORNING OF SURGERY WITH YOUR REGULAR TOOTHPASTE  DENTURES WILL BE REMOVED PRIOR TO SURGERY PLEASE DO NOT APPLY "Poly grip" OR ADHESIVES!!!   Do NOT smoke after Midnight   Take these medicines the morning of surgery with A SIP OF WATER: N/A  DO NOT TAKE ANY ORAL DIABETIC MEDICATIONS DAY OF YOUR SURGERY  Bring CPAP mask and tubing day of surgery.                              You may not have any metal on your body including hair pins, jewelry, and body piercing             Do not wear lotions, powders, perfumes/cologne, or deodorant              Men may shave face and neck.   Do not bring valuables to the hospital. [place].   Contacts, glasses, or bridgework may not be worn into surgery.   Bring small overnight bag day of surgery.   DO NOT [place]. PHARMACY WILL DISPENSE MEDICATIONS LISTED ON YOUR MEDICATION LIST TO YOU DURING YOUR ADMISSION [place]!    Patients discharged on the day of surgery will not be allowed to drive home.  Someone NEEDS to stay with you for the first 24 hours after anesthesia.   Special Instructions: Bring a copy of your healthcare power of attorney and living will documents         the day of surgery if you haven't scanned them before.              Please read over the following fact sheets you were given: IF YOU HAVE QUESTIONS ABOUT YOUR PRE-OP INSTRUCTIONS PLEASE CALL [phone]    [facility] - Preparing for Surgery Before surgery, you can play an important role.  Because skin is not sterile, your skin needs to be as free of germs  as possible.  You can reduce the number of germs on your skin by washing with CHG (chlorahexidine gluconate) soap before surgery.  CHG is an antiseptic cleaner which kills germs and bonds with the skin to continue killing germs even after washing. Please DO NOT use if you have an allergy to CHG or antibacterial soaps.  If your skin becomes reddened/irritated stop using the CHG and inform your nurse when you arrive at Short Stay. Do not shave (including legs and underarms) for at least 48 hours prior to the first CHG shower.  You may shave your face/neck. Please follow these instructions carefully:  1.  Shower with CHG Soap the night before surgery and the  morning of Surgery.  2.  If you choose to wash your hair, wash your hair first as usual with your  normal  shampoo.  3.  After you shampoo, rinse your hair and body thoroughly to remove the  shampoo.                           4.  Use CHG as you would any other liquid soap.  You can apply chg directly  to the skin and wash                       Gently with a scrungie or clean washcloth.  5.  Apply the CHG Soap to your body ONLY FROM THE NECK DOWN.   Do not use on face/ open                           Wound or open  sores. Avoid contact with eyes, ears mouth and genitals (private parts).                       Wash face,  Genitals (private parts) with your normal soap.             6.  Wash thoroughly, paying special attention to the area where your surgery  will be performed.  7.  Thoroughly rinse your body with warm water from the neck down.  8.  DO NOT shower/wash with your normal soap after using and rinsing off  the CHG Soap.                9.  Pat yourself dry with a clean towel.            10.  Wear clean pajamas.            11.  Place clean sheets on your bed the night of your first shower and do not  sleep with pets. Day of Surgery : Do not apply any lotions/deodorants the morning of surgery.  Please wear clean clothes to the hospital/surgery center.  FAILURE TO FOLLOW THESE INSTRUCTIONS MAY RESULT IN THE CANCELLATION OF YOUR SURGERY PATIENT SIGNATURE_________________________________  NURSE SIGNATURE__________________________________  ________________________________________________________________________

## 2022-10-10 NOTE — Progress Notes (Signed)
Pt. Needs orders for surgery. 

## 2022-10-10 NOTE — Patient Instructions (Signed)

## 2022-10-11 ENCOUNTER — Encounter (HOSPITAL_COMMUNITY): Payer: Self-pay

## 2022-10-11 ENCOUNTER — Encounter (HOSPITAL_COMMUNITY)
Admission: RE | Admit: 2022-10-11 | Discharge: 2022-10-11 | Disposition: A | Discharge status: Home / Self Care | Payer: BC Managed Care – PPO | Source: Ambulatory Visit | Attending: Urology | Admitting: Urology

## 2022-10-11 ENCOUNTER — Other Ambulatory Visit: Payer: Self-pay

## 2022-10-11 VITALS — BP 161/96 | HR 64 | Temp 98.2°F | Ht 73.0 in | Wt 215.2 lb

## 2022-10-11 DIAGNOSIS — Z01812 Encounter for preprocedural laboratory examination: Secondary | ICD-10-CM | POA: Diagnosis present

## 2022-10-11 DIAGNOSIS — R7303 Prediabetes: Secondary | ICD-10-CM | POA: Insufficient documentation

## 2022-10-11 DIAGNOSIS — I10 Essential (primary) hypertension: Secondary | ICD-10-CM | POA: Diagnosis not present

## 2022-10-11 HISTORY — DX: Prediabetes: R73.03

## 2022-10-11 HISTORY — DX: Personal history of urinary calculi: Z87.442

## 2022-10-11 LAB — HEMOGLOBIN A1C
Hgb A1c MFr Bld: 6.4 % of total Hgb — ABNORMAL HIGH (ref <5.7)
Mean Plasma Glucose: 137 mg/dL
eAG (mmol/L): 7.6 mmol/L

## 2022-10-11 LAB — CBC WITH DIFFERENTIAL/PLATELET
Absolute Monocytes: 610 cells/uL (ref 200–950)
Basophils Absolute: 101 cells/uL (ref 0–200)
Basophils Relative: 1.5 %
Eosinophils Absolute: 168 cells/uL (ref 15–500)
Eosinophils Relative: 2.5 %
HCT: 41.2 % (ref 38.5–50.0)
Hemoglobin: 13.6 g/dL (ref 13.2–17.1)
Lymphs Abs: 1668 cells/uL (ref 850–3900)
MCH: 29.6 pg (ref 27.0–33.0)
MCHC: 33 g/dL (ref 32.0–36.0)
MCV: 89.6 fL (ref 80.0–100.0)
MPV: 9.7 fL (ref 7.5–12.5)
Monocytes Relative: 9.1 %
Neutro Abs: 4154 cells/uL (ref 1500–7800)
Neutrophils Relative %: 62 %
Platelets: 367 10*3/uL (ref 140–400)
RBC: 4.6 10*6/uL (ref 4.20–5.80)
RDW: 11.8 % (ref 11.0–15.0)
Total Lymphocyte: 24.9 %
WBC: 6.7 10*3/uL (ref 3.8–10.8)

## 2022-10-11 LAB — LIPID PANEL
Cholesterol: 172 mg/dL (ref <200)
HDL: 51 mg/dL (ref >=40)
LDL Cholesterol (Calc): 94 mg/dL (calc)
Non-HDL Cholesterol (Calc): 121 mg/dL (calc) (ref <130)
Total CHOL/HDL Ratio: 3.4 (calc) (ref <5.0)
Triglycerides: 178 mg/dL — ABNORMAL HIGH (ref <150)

## 2022-10-11 LAB — COMPLETE METABOLIC PANEL WITH GFR
AG Ratio: 1.7 (calc) (ref 1.0–2.5)
ALT: 21 U/L (ref 9–46)
AST: 20 U/L (ref 10–35)
Albumin: 4.3 g/dL (ref 3.6–5.1)
Alkaline phosphatase (APISO): 39 U/L (ref 35–144)
BUN/Creatinine Ratio: 20 (calc) (ref 6–22)
BUN: 31 mg/dL — ABNORMAL HIGH (ref 7–25)
CO2: 30 mmol/L (ref 20–32)
Calcium: 10.2 mg/dL (ref 8.6–10.3)
Chloride: 104 mmol/L (ref 98–110)
Creat: 1.58 mg/dL — ABNORMAL HIGH (ref 0.70–1.35)
Globulin: 2.6 g/dL (calc) (ref 1.9–3.7)
Glucose, Bld: 96 mg/dL (ref 65–99)
Potassium: 5.1 mmol/L (ref 3.5–5.3)
Sodium: 140 mmol/L (ref 135–146)
Total Bilirubin: 0.3 mg/dL (ref 0.2–1.2)
Total Protein: 6.9 g/dL (ref 6.1–8.1)
eGFR: 49 mL/min/{1.73_m2} — ABNORMAL LOW (ref >=60)

## 2022-10-11 LAB — CBC
HCT: 41.8 % (ref 39.0–52.0)
Hemoglobin: 13.5 g/dL (ref 13.0–17.0)
MCH: 29.5 pg (ref 26.0–34.0)
MCHC: 32.3 g/dL (ref 30.0–36.0)
MCV: 91.5 fL (ref 80.0–100.0)
Platelets: 353 10*3/uL (ref 150–400)
RBC: 4.57 MIL/uL (ref 4.22–5.81)
RDW: 11.9 % (ref 11.5–15.5)
WBC: 6.9 10*3/uL (ref 4.0–10.5)
nRBC: 0 % (ref 0.0–0.2)

## 2022-10-11 LAB — BASIC METABOLIC PANEL
Anion gap: 6 (ref 5–15)
BUN: 30 mg/dL — ABNORMAL HIGH (ref 8–23)
CO2: 28 mmol/L (ref 22–32)
Calcium: 9.8 mg/dL (ref 8.9–10.3)
Chloride: 105 mmol/L (ref 98–111)
Creatinine, Ser: 1.64 mg/dL — ABNORMAL HIGH (ref 0.61–1.24)
GFR, Estimated: 47 mL/min — ABNORMAL LOW (ref >60)
Glucose, Bld: 109 mg/dL — ABNORMAL HIGH (ref 70–99)
Potassium: 4.5 mmol/L (ref 3.5–5.1)
Sodium: 139 mmol/L (ref 135–145)

## 2022-10-11 LAB — GLUCOSE, CAPILLARY: Glucose-Capillary: 108 mg/dL — ABNORMAL HIGH (ref 70–99)

## 2022-10-11 NOTE — Progress Notes (Signed)
For Short Stay: [place] appointment date:  Bowel Prep reminder:   For Anesthesia: PCP - Dr. [name] Cardiologist -   Chest x-ray -  EKG - 07/08/22 Stress Test -  ECHO -  Cardiac Cath -  Pacemaker/ICD device last checked: Pacemaker orders received: Device Rep notified:  Spinal Cord Stimulator:  Sleep Study - Yes CPAP - Yes  Fasting Blood Sugar - N/A Checks Blood Sugar ____0_ times a day Date and result of last Hgb A1c- 6.4: 10/10/22  Last dose of GLP1 agonist-  GLP1 instructions:   Last dose of SGLT-2 inhibitors-  SGLT-2 instructions:   Blood Thinner Instructions: Aspirin Instructions: To hold it 5 days before surgery Last Dose:  Activity level: Can go up a flight of stairs and activities of daily living without stopping and without chest pain and/or shortness of breath   Able to exercise without chest pain and/or shortness of breath   Unable to go up a flight of stairs without chest pain and/or shortness of breath     Anesthesia review: Hx: HTN,Pre-DIA,OSA(CPAP).  Patient denies shortness of breath, fever, cough and chest pain at PAT appointment   Patient verbalized understanding of instructions that were given to them at the PAT appointment. Patient was also instructed that they will need to review over the PAT instructions again at home before surgery.

## 2022-10-15 LAB — HEMOGLOBIN A1C
Hgb A1c MFr Bld: 6.2 % — ABNORMAL HIGH (ref 4.8–5.6)
Mean Plasma Glucose: 131 mg/dL

## 2022-10-15 NOTE — Progress Notes (Signed)
Lab. Results: creatinine: 1.64.

## 2022-10-17 MED ORDER — GENTAMICIN SULFATE 40 MG/ML IJ SOLN
5.0000 mg/kg | INTRAVENOUS | Status: AC
  Administered 2022-10-18: 440 mg via INTRAVENOUS
  Filled 2022-10-17: qty 11

## 2022-10-17 NOTE — Anesthesia Preprocedure Evaluation (Addendum)
Anesthesia Evaluation  Patient identified by MRN, date of birth, ID band Patient awake    Reviewed: Allergy & Precautions, NPO status , Patient's Chart, lab work & pertinent test results  Airway Mallampati: III  TM Distance: >3 FB Neck ROM: Full    Dental  (+) Teeth Intact, Dental Advisory Given   Pulmonary sleep apnea and Continuous Positive Airway Pressure Ventilation    Pulmonary exam normal breath sounds clear to auscultation       Cardiovascular hypertension, Pt. on home beta blockers and Pt. on medications (-) angina (-) Past MI Normal cardiovascular exam Rhythm:Regular Rate:Normal     Neuro/Psych  PSYCHIATRIC DISORDERS Anxiety     negative neurological ROS     GI/Hepatic negative GI ROS, Neg liver ROS,,,  Endo/Other  negative endocrine ROS    Renal/GU EFT URETERAL STONE     Musculoskeletal negative musculoskeletal ROS (+)    Abdominal   Peds  Hematology negative hematology ROS (+)   Anesthesia Other Findings   Reproductive/Obstetrics                             Anesthesia Physical Anesthesia Plan  ASA: 2  Anesthesia Plan: General   Post-op Pain Management: Tylenol PO (pre-op)*   Induction: Intravenous  PONV Risk Score and Plan: 3 and Midazolam, Dexamethasone and Ondansetron  Airway Management Planned: LMA  Additional Equipment:   Intra-op Plan:   Post-operative Plan: Extubation in OR  Informed Consent: I have reviewed the patients History and Physical, chart, labs and discussed the procedure including the risks, benefits and alternatives for the proposed anesthesia with the patient or authorized representative who has indicated his/her understanding and acceptance.     Dental advisory given  Plan Discussed with: CRNA  Anesthesia Plan Comments:        Anesthesia Quick Evaluation

## 2022-10-18 ENCOUNTER — Ambulatory Visit (HOSPITAL_COMMUNITY): Payer: BC Managed Care – PPO

## 2022-10-18 ENCOUNTER — Ambulatory Visit (HOSPITAL_COMMUNITY): Payer: BC Managed Care – PPO | Admitting: Anesthesiology

## 2022-10-18 ENCOUNTER — Encounter (HOSPITAL_COMMUNITY)
Admission: RE | Disposition: A | Discharge status: Home / Self Care | Payer: Self-pay | Source: Ambulatory Visit | Attending: Urology

## 2022-10-18 ENCOUNTER — Ambulatory Visit (HOSPITAL_COMMUNITY)
Admission: RE | Admit: 2022-10-18 | Discharge: 2022-10-18 | Disposition: A | Discharge status: Home / Self Care | Payer: BC Managed Care – PPO | Source: Ambulatory Visit | Attending: Urology | Admitting: Urology

## 2022-10-18 ENCOUNTER — Encounter (HOSPITAL_COMMUNITY): Payer: Self-pay | Admitting: Urology

## 2022-10-18 DIAGNOSIS — F419 Anxiety disorder, unspecified: Secondary | ICD-10-CM | POA: Insufficient documentation

## 2022-10-18 DIAGNOSIS — G4730 Sleep apnea, unspecified: Secondary | ICD-10-CM | POA: Insufficient documentation

## 2022-10-18 DIAGNOSIS — N281 Cyst of kidney, acquired: Secondary | ICD-10-CM | POA: Diagnosis not present

## 2022-10-18 DIAGNOSIS — I10 Essential (primary) hypertension: Secondary | ICD-10-CM | POA: Insufficient documentation

## 2022-10-18 DIAGNOSIS — R310 Gross hematuria: Secondary | ICD-10-CM | POA: Insufficient documentation

## 2022-10-18 DIAGNOSIS — R7303 Prediabetes: Secondary | ICD-10-CM | POA: Diagnosis not present

## 2022-10-18 DIAGNOSIS — N132 Hydronephrosis with renal and ureteral calculous obstruction: Secondary | ICD-10-CM | POA: Diagnosis present

## 2022-10-18 DIAGNOSIS — N400 Enlarged prostate without lower urinary tract symptoms: Secondary | ICD-10-CM | POA: Diagnosis not present

## 2022-10-18 HISTORY — PX: CYSTOSCOPY/URETEROSCOPY/HOLMIUM LASER/STENT PLACEMENT: SHX6546

## 2022-10-18 SURGERY — CYSTOSCOPY/URETEROSCOPY/HOLMIUM LASER/STENT PLACEMENT
Anesthesia: General | Laterality: Left

## 2022-10-18 MED ORDER — LACTATED RINGERS IV SOLN: INTRAVENOUS | Status: DC

## 2022-10-18 MED ORDER — ACETAMINOPHEN 500 MG PO TABS
ORAL_TABLET | ORAL | Status: AC
  Administered 2022-10-18: 1000 mg via ORAL
  Filled 2022-10-18: qty 2

## 2022-10-18 MED ORDER — SODIUM CHLORIDE 0.9 % IR SOLN
Status: DC | PRN
  Administered 2022-10-18: 6000 mL

## 2022-10-18 MED ORDER — CEPHALEXIN 500 MG PO CAPS: 500.0000 mg | ORAL_CAPSULE | Freq: Two times a day (BID) | ORAL | 0 refills | Status: AC

## 2022-10-18 MED ORDER — FENTANYL CITRATE (PF) 100 MCG/2ML IJ SOLN
INTRAMUSCULAR | Status: AC
  Filled 2022-10-18: qty 2

## 2022-10-18 MED ORDER — PROPOFOL 10 MG/ML IV BOLUS
INTRAVENOUS | Status: AC
  Filled 2022-10-18: qty 20

## 2022-10-18 MED ORDER — PROPOFOL 10 MG/ML IV BOLUS
INTRAVENOUS | Status: DC | PRN
  Administered 2022-10-18: 140 mg via INTRAVENOUS

## 2022-10-18 MED ORDER — FENTANYL CITRATE (PF) 100 MCG/2ML IJ SOLN
INTRAMUSCULAR | Status: DC | PRN
  Administered 2022-10-18: 25 ug via INTRAVENOUS
  Administered 2022-10-18: 50 ug via INTRAVENOUS
  Administered 2022-10-18: 25 ug via INTRAVENOUS

## 2022-10-18 MED ORDER — MIDAZOLAM HCL 5 MG/5ML IJ SOLN
INTRAMUSCULAR | Status: DC | PRN
  Administered 2022-10-18 (×2): 1 mg via INTRAVENOUS

## 2022-10-18 MED ORDER — EPHEDRINE SULFATE (PRESSORS) 50 MG/ML IJ SOLN
INTRAMUSCULAR | Status: DC | PRN
  Administered 2022-10-18 (×2): 5 mg via INTRAVENOUS

## 2022-10-18 MED ORDER — OXYCODONE HCL 5 MG PO TABS: 5.0000 mg | ORAL_TABLET | Freq: Four times a day (QID) | ORAL | 0 refills | Status: DC | PRN

## 2022-10-18 MED ORDER — ONDANSETRON HCL 4 MG/2ML IJ SOLN
INTRAMUSCULAR | Status: DC | PRN
  Administered 2022-10-18: 4 mg via INTRAVENOUS

## 2022-10-18 MED ORDER — ORAL CARE MOUTH RINSE: 15.0000 mL | Freq: Once | OROMUCOSAL | Status: AC

## 2022-10-18 MED ORDER — DEXAMETHASONE SODIUM PHOSPHATE 10 MG/ML IJ SOLN
INTRAMUSCULAR | Status: AC
  Filled 2022-10-18: qty 1

## 2022-10-18 MED ORDER — LIDOCAINE 2% (20 MG/ML) 5 ML SYRINGE
INTRAMUSCULAR | Status: DC | PRN
  Administered 2022-10-18: 80 mg via INTRAVENOUS

## 2022-10-18 MED ORDER — CHLORHEXIDINE GLUCONATE 0.12 % MT SOLN
15.0000 mL | Freq: Once | OROMUCOSAL | Status: AC
  Administered 2022-10-18: 15 mL via OROMUCOSAL

## 2022-10-18 MED ORDER — LIDOCAINE HCL (PF) 2 % IJ SOLN
INTRAMUSCULAR | Status: AC
  Filled 2022-10-18: qty 5

## 2022-10-18 MED ORDER — DEXAMETHASONE SODIUM PHOSPHATE 4 MG/ML IJ SOLN
INTRAMUSCULAR | Status: DC | PRN
  Administered 2022-10-18: 4 mg via INTRAVENOUS

## 2022-10-18 MED ORDER — IOHEXOL 300 MG/ML  SOLN
INTRAMUSCULAR | Status: DC | PRN
  Administered 2022-10-18: 12 mL

## 2022-10-18 MED ORDER — SENNOSIDES-DOCUSATE SODIUM 8.6-50 MG PO TABS: 1.0000 | ORAL_TABLET | Freq: Two times a day (BID) | ORAL | 0 refills | Status: DC

## 2022-10-18 MED ORDER — ACETAMINOPHEN 500 MG PO TABS: 1000.0000 mg | ORAL_TABLET | Freq: Once | ORAL | Status: AC

## 2022-10-18 MED ORDER — ONDANSETRON HCL 4 MG/2ML IJ SOLN
INTRAMUSCULAR | Status: AC
  Filled 2022-10-18: qty 2

## 2022-10-18 MED ORDER — MIDAZOLAM HCL 2 MG/2ML IJ SOLN
INTRAMUSCULAR | Status: AC
  Filled 2022-10-18: qty 2

## 2022-10-18 SURGICAL SUPPLY — 22 items
BAG URO CATCHER STRL LF (MISCELLANEOUS) ×1 IMPLANT
BASKET LASER NITINOL 1.9FR (BASKET) IMPLANT
CATH URETL OPEN END 6FR 70 (CATHETERS) ×1 IMPLANT
CLOTH BEACON ORANGE TIMEOUT ST (SAFETY) ×1 IMPLANT
EXTRACTOR STONE 1.7FRX115CM (UROLOGICAL SUPPLIES) IMPLANT
GLOVE SURG LX STRL 7.5 STRW (GLOVE) ×1 IMPLANT
GOWN STRL REUS W/ TWL XL LVL3 (GOWN DISPOSABLE) ×1 IMPLANT
GOWN STRL REUS W/TWL XL LVL3 (GOWN DISPOSABLE) ×1
GUIDEWIRE ANG ZIPWIRE 038X150 (WIRE) ×1 IMPLANT
GUIDEWIRE STR DUAL SENSOR (WIRE) ×1 IMPLANT
KIT TURNOVER KIT A (KITS) IMPLANT
LASER FIB FLEXIVA PULSE ID 365 (Laser) IMPLANT
MANIFOLD NEPTUNE II (INSTRUMENTS) ×1 IMPLANT
PACK CYSTO (CUSTOM PROCEDURE TRAY) ×1 IMPLANT
SHEATH NAVIGATOR HD 11/13X28 (SHEATH) IMPLANT
SHEATH NAVIGATOR HD 11/13X36 (SHEATH) IMPLANT
STENT POLARIS 5FRX26 (STENTS) IMPLANT
TRACTIP FLEXIVA PULS ID 200XHI (Laser) IMPLANT
TRACTIP FLEXIVA PULSE ID 200 (Laser) IMPLANT
TUBE PU 8FR 16IN ENFIT (TUBING) ×1 IMPLANT
TUBING CONNECTING 10 (TUBING) ×1 IMPLANT
TUBING UROLOGY SET (TUBING) ×1 IMPLANT

## 2022-10-18 NOTE — Anesthesia Procedure Notes (Signed)
Procedure Name: LMA Insertion Date/Time: 10/18/2022 7:30 AM  Performed by: Deliah Boston, CRNAPre-anesthesia Checklist: Patient identified, Emergency Drugs available, Suction available and Patient being monitored Patient Re-evaluated:Patient Re-evaluated prior to induction Oxygen Delivery Method: Circle system utilized Preoxygenation: Pre-oxygenation with 100% oxygen Induction Type: IV induction Ventilation: Mask ventilation without difficulty LMA: LMA inserted LMA Size: 4.0 Number of attempts: 1 Placement Confirmation: positive ETCO2 and breath sounds checked- equal and bilateral Tube secured with: Tape Dental Injury: Teeth and Oropharynx as per pre-operative assessment

## 2022-10-18 NOTE — Brief Op Note (Signed)
10/18/2022  8:26 AM  PATIENT:  Tony Bailey  62 y.o. male  PRE-OPERATIVE DIAGNOSIS:  LEFT URETERAL STONE  POST-OPERATIVE DIAGNOSIS:  LEFT URETERAL STONE  PROCEDURE:  Procedure(s): CYSTOSCOPY LEFT RETROGRADE PYELOGRAM URETEROSCOPY/HOLMIUM LASER/STENT PLACEMENT (Left)  SURGEON:  Surgeon(s) and Role:    * Alexis Frock, MD - Primary  PHYSICIAN ASSISTANT:   ASSISTANTS: none   ANESTHESIA:   general  EBL:  5 mL   BLOOD ADMINISTERED:none  DRAINS: none   LOCAL MEDICATIONS USED:  NONE  SPECIMEN:  Source of Specimen:  left ureteral stone fragments  DISPOSITION OF SPECIMEN:   [facility] for compositional analysis  COUNTS:  YES  TOURNIQUET:  * No tourniquets in log *  DICTATION: .Other Dictation: Dictation Number [id number]  PLAN OF CARE: Discharge to home after PACU  PATIENT DISPOSITION:  PACU - hemodynamically stable.   Delay start of Pharmacological VTE agent (>24hrs) due to surgical blood loss or risk of bleeding: yes

## 2022-10-18 NOTE — Discharge Instructions (Signed)
1 - You may have urinary urgency (bladder spasms) and bloody urine on / off with stent in place. This is normal. ° °2 - Call MD or go to ER for fever >102, severe pain / nausea / vomiting not relieved by medications, or acute change in medical status ° °

## 2022-10-18 NOTE — Transfer of Care (Signed)
Immediate Anesthesia Transfer of Care Note  Patient: Tony Bailey  Procedure(s) Performed: Procedure(s): CYSTOSCOPY LEFT RETROGRADE PYELOGRAM URETEROSCOPY/HOLMIUM LASER/STENT PLACEMENT (Left)  Patient Location: PACU  Anesthesia Type:General  Level of Consciousness: Patient easily awoken, sedated, comfortable, cooperative, following commands, responds to stimulation.   Airway & Oxygen Therapy: Patient spontaneously breathing, ventilating well, oxygen via simple oxygen mask.  Post-op Assessment: Report given to PACU RN, vital signs reviewed and stable, moving all extremities.   Post vital signs: Reviewed and stable.  Complications: No apparent anesthesia complications Last Vitals:  Vitals Value Taken Time  BP 154/88 10/18/22 0834  Temp    Pulse 68 10/18/22 0834  Resp 16 10/18/22 0834  SpO2 100 % 10/18/22 0834  Vitals shown include unvalidated device data.  Last Pain:  Vitals:   10/18/22 0546  TempSrc: Oral  PainSc:          Complications: No notable events documented.

## 2022-10-18 NOTE — Anesthesia Postprocedure Evaluation (Signed)
Anesthesia Post Note  Patient: Tony Bailey  Procedure(s) Performed: CYSTOSCOPY LEFT RETROGRADE PYELOGRAM URETEROSCOPY/HOLMIUM LASER/STENT PLACEMENT (Left)     Patient location during evaluation: PACU Anesthesia Type: General Level of consciousness: awake and alert Pain management: pain level controlled Vital Signs Assessment: post-procedure vital signs reviewed and stable Respiratory status: spontaneous breathing, nonlabored ventilation, respiratory function stable and patient connected to nasal cannula oxygen Cardiovascular status: blood pressure returned to baseline and stable Postop Assessment: no apparent nausea or vomiting Anesthetic complications: no   No notable events documented.  Last Vitals:  Vitals:   10/18/22 0845 10/18/22 0900  BP: (!) 157/86 (!) 147/82  Pulse: 68 67  Resp: 13 14  Temp:  36.6 C  SpO2: 97% 93%    Last Pain:  Vitals:   10/18/22 0900  TempSrc:   PainSc: 0-No pain                 Santa Lighter

## 2022-10-18 NOTE — H&P (Signed)
Tony Bailey is an 62 y.o. male.    Chief Complaint: Pre-OP LEFT Ureteroscopic Stone Manipulation  HPI:   1 - Urolithiasis - several passage episdoes small stones w/o much colic  03/736 - 10GY left renal stone, no hydro on eval hematuria. Cr 1.1, Not easily seen on CT scout images.   Recent Surveillance:  10/2021 - KUB, RUS - stable [facility] no hydro and bilat non-complex cysts.  10/2022 - CT 93m left mid ureteral stone with moderate/severe hydro.   2 - Gross Hematuria / Very Large Prostate - non smoker. Gross hematuria x several 2022. Non-con CT 06/2021 with left renal pelvis stone and very large prostate. TURP 06/2021, on finasteride post-op to prevent regrowth / bleeding.   3 - Prostate Screening - pt's father with prostate cancer. Large prostate with median lobe by CT 2022.  06/2021 - PSA 1.35 (pre-finasteride)   4 - Bilateral R>L Non-Complex Renal Cysts - Rt mid 4cm x 2, Lt mid 2cm cysts on CT 06/2021. No mass effect.   PMH sig for obesity, preDM, VAricose veins, melanoma (gets annual skin exam). 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 Most recent UCX negative. Cr 1.6.   Past Medical History:  Diagnosis Date   Anesthesia complication    Pt stated that years ago when he was having surgery for a perirectal abcess his heart stopped. He stated that he was told it was due to the position he was in and the anesthesia going to his heart. He said they were able to get him back quickly. Since then he has had surgeries with no complications.   Anxiety    Bleeding from varicose vein    History of kidney stones    Hyperlipidemia    Hypertension    [place]   upper back mid right side   OSA (obstructive sleep apnea)    Other testicular hypofunction    Pre-diabetes    Prediabetes 05/09/2019   Sciatic nerve pain 2010   hx - no current  problems per patient 07/06/20   Sleep apnea    uses cpap nightly   Testosterone deficiency 10/13/2013   Unspecified vitamin D deficiency    Venous insufficiency    bilateral    Past Surgical History:  Procedure Laterality Date   [facility]   CYSTOSCOPY/URETEROSCOPY/HOLMIUM LASER/STENT PLACEMENT Left 07/13/2021   Procedure: CYSTOSCOPY BILATERAL RETROGRADE PYELOGRAM/CYSTOLITHALOPAXY;  Surgeon: MAlexis Frock MD;  [place] [facility]  Service: Urology;  Laterality: Left;   ENDOVENOUS ABLATION SAPHENOUS VEIN W/ LASER Left 08/08/2021   endovenous laser ablation left greater saphenous veins and stab phlebectomy > 20 incisions left leg by CGae GallopMD   HERNIA REPAIR Left groin   1970   HERNIA REPAIR Right groin   2011   INCISION AND DRAINAGE PERIRECTAL ABSCESS     x 2   INCISION AND DRAINAGE PERIRECTAL ABSCESS  1998   MELANOMA EXCISION  2009   melanona  2009   upper back -mid right side   NASAL SEPTUM SURGERY     SHOULDER ARTHROSCOPY Left 2003   TISSUE GRAFT     hx of gum grafts   TRANSURETHRAL RESECTION OF PROSTATE  07/13/2021   Procedure: TRANSURETHRAL RESECTION OF THE PROSTATE (TURP);  Surgeon: MAlexis Frock MD;  [facility]  Service: Urology;;   WISDOM TOOTH EXTRACTION  1976    Family History  Problem Relation Age of Onset   Aneurysm Mother 31   AAA (abdominal aortic aneurysm) Father    Kidney disease Father    Kidney failure Father    Prostate cancer Father    CAD Father    AAA (abdominal aortic aneurysm) Paternal Aunt    CAD Paternal Aunt    Heart attack Paternal Uncle        Several paternal uncles with CAD hx   Colon cancer Neg Hx    Stomach cancer Neg Hx    Rectal cancer Neg Hx    Social History:  reports that he has never smoked. He has never used smokeless tobacco. He reports that he does not drink alcohol and does not use drugs.  Allergies: No Known Allergies  Medications  Prior to Admission  Medication Sig Dispense Refill   aspirin EC 81 MG tablet Take 81 mg by mouth daily. Swallow whole.     bisoprolol-hydrochlorothiazide (ZIAC) 5-6.25 MG tablet Take  1 tablet  Daily for BP                                            /                    TAKE 1 TABLET BY MOUTH 90 tablet 3   Cholecalciferol (VITAMIN D-3 PO) Take 10,000 Units by mouth daily.      Cinnamon 500 MG capsule Take 1,000 mg by mouth 2 (two) times daily.     fenofibrate (TRICOR) 145 MG tablet TAKE 1 TABLET BY MOUTH DAILY FOR TRIGLYCERIDES 90 tablet 2   finasteride (PROSCAR) 5 MG tablet Take 5 mg by mouth daily.     fluticasone (FLONASE) 50 MCG/ACT nasal spray SHAKE LIQUID AND USE 1 TO 2 SPRAYS IN EACH NOSTRIL 1 TO 2 TIMES PER DAY (Patient taking differently: Place 1 spray into both nostrils at bedtime.) 48 g 3   Multiple Vitamin (MULTIVITAMIN) tablet Take 1 tablet by mouth daily.     Omega-3 Fatty Acids (FISH OIL) 1000 MG CPDR Take 2,000 mg by mouth 2 (two) times daily.     rosuvastatin (CRESTOR) 40 MG tablet Take 1 tablet (40 mg total) by mouth daily. 90 tablet 3   triamcinolone ointment (KENALOG) 0.1 % Apply 1 Application topically 2 (two) times daily. (Patient taking differently: Apply 1 Application topically 2 (two) times daily as needed (irritation).) 80 g 1   zinc gluconate 50 MG tablet Take 50 mg by mouth daily.     promethazine-dextromethorphan (PROMETHAZINE-DM) 6.25-15 MG/5ML syrup Take 5 mLs by mouth 4 (four) times daily as needed for cough. (Patient not taking: Reported on 10/10/2022) 240 mL 0    No results found for this or any previous visit (from the past 48 hour(s)). No results found.  Review of Systems  Constitutional:  Negative for chills and fever.  Genitourinary:  Positive for flank pain.  All other systems reviewed and are negative.   There were no vitals taken for this visit. Physical Exam Vitals reviewed.  HENT:     Head: Normocephalic.  Eyes:     Pupils: Pupils are equal,  round, and reactive to light.  Cardiovascular:     Rate and Rhythm: Normal rate.  Pulmonary:     Effort: Pulmonary effort is  normal.  Abdominal:     General: Abdomen is flat.  Genitourinary:    Comments: Mild left CVAT at present Musculoskeletal:        General: Normal range of motion.     Cervical back: Normal range of motion.  Neurological:     General: No focal deficit present.     Mental Status: He is alert.  Psychiatric:        Mood and Affect: Mood normal.      Assessment/Plan  Proceed as planned with LEFT ureteroscopic stone manipulation. Risks,benefits, alternatives, expected peri-op course including likely stent given large stone size discussed previously and reiterated today.   Alexis Frock, MD 10/18/2022, 5:36 AM

## 2022-10-18 NOTE — Op Note (Signed)
[name], [name] MEDICAL RECORD NO: [id number] ACCOUNT NO: [id number] DATE OF BIRTH: 01-09-1961 FACILITY: [facility] LOCATION: [facility] PH[facility]ICIAN: Tony Frock, MD  Operative Report   PREOPERATIVE DIAGNOSES:  Very large left ureteral stone, hematuria and colic.  PROCEDURE PERFORMED: 1.  Cystoscopy with left retrograde pyelogram, interpretation. 2.  Left ureteroscopy with laser lithotripsy. 3.  Insertion of left ureteral stent.  ESTIMATED BLOOD LOSS:  Nil.  COMPLICATIONS:  None.  SPECIMEN:  Left ureteral stone fragments for composition analysis.  FINDINGS: 1.  Very large left mid ureteral stone with moderate hydronephrosis. 2.  Complete resolution of all accessible stone fragments larger than one-third mm following laser lithotripsy and basket extraction. 3.  Significant mucosal edema at the site of prior stone impaction, decision made to leave non-tethered stent.  INDICATIONS:  The patient is a very pleasant 62 year old man with history of prostatic hypertrophy, status post transurethral resection of the prostate years ago.  He has been on finasteride to prevent regrowth.  He has done well from this perspective.  He does have a large known left lower pole renal stone times several years that has not caused problems until recently where he was found on workup of colicky flank pain to have progression of this large left renal stone into the mid ureter.  This was  well over a centimeter associated with significant hydronephrosis and some renal insufficiency. Options were discussed for management including recommended path of ureteroscopy with goal of left sided stone free.  He presents for this today.  Informed  consent was obtained and placed in medical record.  PROCEDURE IN DETAIL:  The patient being Tony Bailey verified and procedure being left ureteroscopic stone manipulation was confirmed.  Procedure timeout was performed.  Intravenous antibiotics administered.  General  anesthesia was induced.  The patient  was placed into a low lithotomy position.  Sterile field was created, prepped and draped the patient's penis, perineum, and proximal thighs using iodine.  Cystourethroscopy was performed using ____ [place] rigid cystoscope with offset lens.  Inspection  of anterior and posterior urethra did reveal a prior TURP defect with wide open prostatic fossa, minimal dystrophic calcification.  Inspection of the bladder was unremarkable, no diverticula, calcifications, papillary lesions noted.  The left ureteral  orifice was cannulated with a 6-French end-hole catheter, and a left retrograde pyelogram was obtained.  Left retrograde pyelogram demonstrated a single left ureter, single system left kidney.  There was a very large filling defect in the mid ureter consistent with known stone.  There was moderate hydronephrosis above this.  A 0.038 ZIPwire was carefully  navigated above the stone to the level of the mid pole, set aside as a safety wire.  An 8-French feeding tube placed in the urinary bladder for pressure release.  Next, semirigid ureteroscopy was performed of the distal left ureter alongside a separate  sensor working wire.  As anticipated, in the mid ureter, there was a very large ureteral stone.  This was quite impacted with significant mucosal edema and reaction around this.  This was much too large for simple basketing.  As such, holmium laser  energy applied to stone using 0.2 joules and 20 Hz.  Approximately 50% of stone dusted, 50% fragmented with majority of fragments being amenable to basketing with Escape basket and they were repositioned to the level of the bladder.  Adherently, some  fragments were retrograde positioned towards the area of the kidney and these were maximally removed with the semirigid scope to its length limit  at which point, the semirigid scope was exchanged for a medium length ureteral access sheath over the sensor  working wire to the level  of the mid ureter, taking exquisite care not to pass the sheath more proximal to the visualized portions and flexible digital ureteroscopy was performed of the more proximal left ureter. As anticipated, there were multiple  retrograde fragments that were amenable to simple basketing with Escape basket and removed, set aside.  The kidney was also inspected as well including all calices x3.  There were several small fragments that were amenable to basketing as well.   Following this, complete resolution of all accessible stone fragments larger than one-third mm.  Hemostasis was excellent.  Access sheath was removed under continuous vision.  Again, there was a significant mucosal edema in the area of prior stone  impaction.  Given the very large stone size and persistent edema, it was felt that interval stenting with non-tethered stent would be most prudent.  As such, a new 5 x 26 Polaris type stent was placed using cystoscopic and fluoroscopic guidance.  The  previously repositioned stone fragments at the level of the bladder were irrigated, set aside for composition analysis.  Bladder was partially emptied per cystoscope.  Procedure was then terminated.  The patient tolerated the procedure well, no immediate  periprocedural complications.  The patient was taken to postanesthesia care unit in stable condition.  Plan for discharge home.   NIK D: 10/18/2022 8:31:48 am T: 10/18/2022 10:25:00 am  JOB: [id number]/ [id number]

## 2022-10-19 ENCOUNTER — Encounter (HOSPITAL_COMMUNITY): Payer: Self-pay | Admitting: Urology

## 2022-10-21 NOTE — Telephone Encounter (Signed)
Updated [place] no auth req spoke to [place] ref # [id number] on 10/21/22

## 2022-10-22 ENCOUNTER — Other Ambulatory Visit: Payer: Self-pay

## 2022-10-22 DIAGNOSIS — I10 Essential (primary) hypertension: Secondary | ICD-10-CM

## 2022-10-22 MED ORDER — BISOPROLOL-HYDROCHLOROTHIAZIDE 5-6.25 MG PO TABS: ORAL_TABLET | ORAL | 3 refills | Status: DC

## 2022-11-05 ENCOUNTER — Encounter: Payer: Self-pay | Admitting: Nurse Practitioner

## 2022-11-05 MED ORDER — MUPIROCIN CALCIUM 2 % EX CREA: TOPICAL_CREAM | CUTANEOUS | 0 refills | Status: DC

## 2022-11-12 ENCOUNTER — Ambulatory Visit (INDEPENDENT_AMBULATORY_CARE_PROVIDER_SITE_OTHER): Payer: BC Managed Care – PPO | Admitting: Neurology

## 2022-11-12 DIAGNOSIS — I700 Atherosclerosis of aorta: Secondary | ICD-10-CM

## 2022-11-12 DIAGNOSIS — G4733 Obstructive sleep apnea (adult) (pediatric): Secondary | ICD-10-CM | POA: Diagnosis not present

## 2022-11-12 DIAGNOSIS — R7309 Other abnormal glucose: Secondary | ICD-10-CM

## 2022-11-12 DIAGNOSIS — E663 Overweight: Secondary | ICD-10-CM

## 2022-11-12 DIAGNOSIS — I10 Essential (primary) hypertension: Secondary | ICD-10-CM

## 2022-11-14 ENCOUNTER — Other Ambulatory Visit: Payer: Self-pay

## 2022-11-14 MED ORDER — ROSUVASTATIN CALCIUM 40 MG PO TABS: 40.0000 mg | ORAL_TABLET | Freq: Every day | ORAL | 3 refills | Status: DC

## 2022-11-18 NOTE — Procedures (Signed)
[facility] SPLIT NIGHT INTERPRETATION REPORT   STUDY DATE: 11/12/2022     PATIENT NAME:  Tony Bailey, Tony Bailey         DATE OF BIRTH:  Jan 28, 1961  PATIENT ID:  [id number]    TYPE OF STUDY:  SPLIT  READING PHYSICIAN: Larey Seat,  SCORING TECHNICIAN: Gaylyn Cheers   INDICATIONS:  This 62 year-old Male reports symptoms suggesting . The Epworth Sleepiness Scale was 14 out of 24 (scores above or equal to 10 are suggestive of hypersomnolence).  DESCRIPTION: A registered sleep technologist ( RPSGT) was in attendance for the duration of the recording.  Data collection, scoring, video monitoring, and reporting were performed in compliance with the AASM Manual for the Scoring of Sleep and Associated Events; (Hypopnea is scored based on the criteria listed in Section VIII D. 1b in the AASM Manual V2.6 using a 4% oxygen desaturation rule or Hypopnea is scored based on the criteria listed in Section VIII D. 1a in the AASM Manual V2.6 using 3% oxygen desaturation and /or arousal rule).  A physician certified by the American Board of Sleep Medicine reviewed each epoch of the study.  ADDITIONAL INFORMATION:  Height: 73.5 in Weight: 213 lb (BMI 27) Neck Size: 17.0 in Medications: Aspirin, Ziac, Vitamin D3, Tricor, Proscar, Flonase, Multivitamin, fish oil, Crestor, Zinc gluconate  FINDINGS:  Please refer to the attached summary for additional quantitative information.  STUDY DETAILS: Lights off was at 21:57: and lights on 05:04: (426 minutes hours in bed). This study was performed with an initial diagnostic portion followed by positive airway pressure titration.  DIAGNOSTIC PART 1 SLEEP CONTINUITY AND SLEEP ARCHITECTURE:  The diagnostic portion of the study began at 21:57 and ended at 00:52, for a recording time of 2h 54.30mminutes.  Total sleep time was 134 minutes minutes (55.6% supine;  44.4% lateral;  0.0% prone, 5.6% REM sleep), with a decreased sleep efficiency at 76.8%.  Sleep latency was normal at 11.0 minutes. REM sleep latency was increased at 109.0 minutes.  Arousal index was 13.0 /hr. Of the total sleep time, the percentage of stage N1 sleep was 6.0%, stage N2 sleep was 87.7%, stage N3 sleep was 0.7%, and REM sleep was 5.6%. There were 1 Stage R periods observed during this portion of the study, 11 awakenings (i.e. transitions to Stage W from any sleep stage), and 36.0 total stage transitions. Wake after sleep onset (WASO) time accounted for 29 minutes.     RESPIRATORY MONITORING:  Based on CMS criteria (using a 4% oxygen desaturation rule for scoring hypopneas), there were 1 apneas (1 obstructive; 0 central; 0 mixed), and 40 hypopneas.  Apnea index was 0.4. Hypopnea index was 16.6. The apnea-hypopnea index was 17.0 overall (13.4 supine; 56.0 REM, 0.0 supine REM). There were 0 respiratory effort-related arousals (RERAs).  The RERA index was 0.0 /hr. Total respiratory disturbance index (RDI) was 17.0 /hr. RDI results showed: supine RDI  24.2 /hr; non-supine RDI 8.1 /hr; REM RDI 56.0 /hr, supine REM RDI 0.0 /hr.   Respiratory events were associated with oxyhemoglobin desaturations (nadir 82%) from a normal baseline (mean 93%). Total time spent at, or below 88% was 8.8 minutes, or 6.6%  of total sleep time. Snoring was recorded between mild and moderate in severeity.  There were no occurrences of Cheyne Stokes breathing. BODY POSITION: Duration of total sleep and percent of total sleep in their respective position is as follows: supine 74 minutes minutes (55.6%), non-supine 59.5 minutes (44.4%); right 00  minutes minutes (0.0%), left 59 minutes minutes (44.4%), and prone 00 minutes minutes (0.0%). Total supine REM sleep time was 00 minutes minutes (0.0% of total REM sleep). LIMB MOVEMENTS: There were 10 periodic limb movements of sleep (4.5/hr), of which 0 (0.0/hr) were associated with an arousal.   OXIMETRY: Total sleep time spent at, or below 88% was 8.3 minutes, or 6.2%  of total sleep time.    AROUSAL (Baseline): There were 26.0 arousals in total, for an arousal index of 11.6 /hour.  Of these, 10.0 were identified as respiratory-related arousals (4.5 /hr), 0 were PLM-related arousals (0.0 /hr), and 16 were non-specific arousals (7.2 /hr) EKG: Analysis of electrocardiogram activity showed the highest heart rate for the baseline portion of the study was 83.0 beats per minute.  NSR.  The heart rate during sleep was  between 55 and 77 bpm.        TREATMENT ANALYSIS SLEEP CONTINUITY AND SLEEP ARCHITECTURE:  SPLIT protocol threshold was set at AHI 10 ans an AHI of 15.9/h was reached. The treatment portion of the study began under a nasal pillow at 00:52 and ended at 05:04, for a recording time of 4h 12.54mminutes.   Total sleep time was 228 minutes minutes (80.5% supine;  19.5% lateral; 0.0% prone, 9.6% REM sleep), with a normal sleep efficiency at 90.5%.  Sleep latency was 11.0 minutes. REM sleep latency was 80.0 minutes.  Wake after sleep onset (WASO) time accounted for 13 minutes. Arousal index was 5.0 /hr. Of the total sleep time, the percentage of stage N1 sleep was 2.6%, stage N3 sleep was 0.0%, and REM sleep was 9.6%. There were 2 Stage R periods observed during this portion of the study, 10 awakenings (i.e. transitions to Stage W from any sleep stage), and 34.0 total stage transitions.   RESPIRATORY MONITORING:    While on PAP therapy, based on CMS criteria, the apnea-hypopnea index was 0.8 overall (0.5 supine; 0.3 REM). Respiratory events were associated with oxyhemoglobin desaturation (nadir 81.0%) from a mean of 94.0%.  Total time spent at, or below 88% was 1.3 minutes, or 0.6%  of total sleep time.  Snoring was absent:  . There were 0.0 occurrences of Cheyne Stokes breathing. BODY POSITION: Duration of total sleep and percent of total sleep in their respective position is as follows: supine 183 minutes minutes (80.5%), non-supine 44.5 minutes (19.5%); right  44 minutes minutes (19.5%), left 00 minutes minutes (0.0%), and prone 00 minutes minutes (0.0%). Total supine REM sleep time was 22 minutes minutes (100.0% of total REM sleep). OXIMETRY: Total sleep time spent at, or below 88% was 1.0 minutes, or 0.4% of total sleep time. Snoring was classified as absent.  LIMB MOVEMENTS: There were 0 periodic limb movements of sleep (0.0/hr), of which 0 (0.0/hr) were associated with an arousal.     EKG : heart rate varied between 51 and  78 bpm.   AROUSAL: There were 13.0 arousals in total, for an arousal index of 3.4 /hour.  Of these, 1.0 were identified as respiratory-related arousals (0.3 /hr), 0 were PLM-related arousals (0.0 /hr), and 13 were non-specific arousals (3.4 /hr)    IMPRESSION: There was mild sleep apnea present at baseline and  this apnea ( OSA ) responded preferable to 6 cm water pressure CPAP with a residual AHI of 0.8/h , using a ResMed nasal pillow P 10 mask.  A ResMed device with heated humidification for auto titration set at 5- 8 cm water pressure without EPR will be  ordered.      RECOMMENDATIONS:  Larey Seat,  MD    CODED DIAGNOSES: OSA        [facility] CPAP/Bilevel Report    General Information  Name: Tony Bailey, Tony Bailey BMI: 27 Physician: ,   ID: [id number] Height: 3 in Technician: Gaylyn Cheers  Sex: Male Weight: 213 lb Record: [id number]  Age: 59 [03-12-1961] Date: 11/12/2022 Scorer: Gaylyn Cheers   Recommended Settings IPAP: N/A cmH20 EPAP: N/A cmH2O AHI: N/A AHI (4%): N/A   Pressure IPAP/EPAP 00 05 06   O2 Vol 0.0 0.0 0.0  Time TRT 174.47m86.52m65.73m37mTST 134.8228m 58228m73m 156m73m  S76mp Stage % Wake 17.8 16.2 5.2   % REM 5.6 0.0 14.1   % N1 6.0 4.1 1.9   % N2 87.7 95.9 83.9   % N3 0.7 0.0 0.0  Respiratory Total Events 44 3 5   Obs. Apn. 1 0 0   Mixed Apn. 0 0 0   Cen. Apn. 0 0 0   Hypopneas 43 3 5   AHI 19.70 2.48 1.93   Supine AHI 28.99 0.00 1.93    Prone AHI 0.00 0.00 0.00   Side AHI 8.07 4.04 0.00  Respiratory (4%) Hypopneas (4%) 37.00 1.00 2.00   AHI (4%) 17.01 0.83 0.77   Supine AHI (4%) 24.16 0.00 0.77   Prone AHI (4%) 0.00 0.00 0.00   Side AHI (4%) 8.07 1.35 0.00  Desat Profile <= 90% 19.73m 2.16m.73m53m<= 43m 0.8228m 0.8228m 012m   673m70%66m8228m 0.8228m 0.28m78228m<= 74m 0.88228m0.8228m 0.28m  [id number] 0.4 0.0 0.0   Hypopnea 4.0 0.0 0.4   LM 1.3 1.7 1.2   Spontaneous 7.2 1.7 4.2   [facility] CP[facility] Summary    General Information  Name: Tony Bailey, Tony Bailey BMILinsey, Hirotaian: C28.41 Tony Bailey, MLarey Seat9 Height: [id number]: Matthew Randall, RGaylyn Cheers Weight: 213.0 lb Record: [id number]  Age: 34 [10/13/1961] Dat58 110-23-60    Medical & Medication History    Tony Bailey isKEYMARION BEARMAN old W7te or Caucasian male patient seen here as a referral from Dr Mc Keown, for a slJones Broomultation . The patient had the first sleep study in the year 2006, the physician had left the community and so the patient had no follow up. [employer] gives suppliHuey Romanse has a past medical history of Anesthesia complication, Anxiety, Bleeding from varicose vein, Hyperlipidemia, Hypertension, Kidney stones, Melanoma (HCC) (2009), OSA (Arvadatructive sleep apnea), Prediabetes (05/09/2019), Sciatic nerve pain (2010), Testosterone deficiency (10/13/2013), Unspecified vitamin D deficiency, and Venous insufficiency.  Aspirin, Ziac, Vitamin D3, Tricor, Proscar, Flonase, Multivitamin, fish oil, Crestor, Zinc gluconate   Sleep Disorder      Comments   Patient arrived for a diagnostic polysomnogram. Procedure explained and all questions answered. Patient has been a compliant CPAP user for many years, with nasal pillows at 6cm (according to patient). Standard paste setup without complications. Patient slept left, right, and supine. Mild to loud snoring was heard. Snoring and respiratory events observed to be worse  while supine or in REM sleep. After two hours total sleep time (TST), AHI = 15.9. CPAP was started at 5cm/H2O, with heated humidity using the medium P-10 nasal pillows mask, and increased to 6 cm/H2O in an effort to control obstructive events and abolish snoring. No obvious cardiac arrhythmias noted. No significant PLMS observed. Patient  had no restroom visits.    CPAP start time: 12:52:29 AM CPAP end time: 05:04:27 AM   Time Total Supine Side Prone Upright  Recording (TRT) 4h 12.738m3h 19.564mh 52.38m33m 0.11m 44m0.11m  23mep (TST) 3h 48.11m 3h538m38m 0h 9m38m 0h 0611m 0h 0.49m  Late411m N1 N2 N3 REM Onset Per. Slp. Eff.  Actual 0h 11.11m 0h 11.5538mh 0.11m 72m20.11m 87m11.11m 053m1.11m 9032m%   Stg63mr Wake N1 N2 N3 REM  Total 22.5 6.0 200.0 0.0 22.0  Supine 14.5 4.0 157.5 0.0 22.0  Side 8.0 2.0 42.5 0.0 0.0  Prone 0.0 0.0 0.0 0.0 0.0  Upright 0.0 0.0 0.0 0.0 0.0   Stg % Wake N1 N2 N3 REM  Total 9.0 2.6 87.7 0.0 9.6  Supine 5.8 1.8 69.1 0.0 9.6  Side 3.2 0.9 18.6 0.0 0.0  Prone 0.0 0.0 0.0 0.0 0.0  Upright 0.0 0.0 0.0 0.0 0.0     Apnea Summary Sub Supine Side Prone Upright  Total 0 Total 0 0 0 0 0    REM 0 0 0 0 0    NREM 0 0 0 0 0  Obs 0 REM 0 0 0 0 0    NREM 0 0 0 0 0  Mix 0 REM 0 0 0 0 0    NREM 0 0 0 0 0  Cen 0 REM 0 0 0 0 0    NREM 0 0 0 0 0   Rera Summary Sub Supine Side Prone Upright  Total 0 Total 0 0 0 0 0    REM 0 0 0 0 0    NREM 0 0 0 0 0   Hypopnea Summary Sub Supine Side Prone Upright  Total 8 Total '8 5 3 '$ 0 0    REM 4 4 0 0 0    NREM '4 1 3 '$ 0 0   4% Hypopnea Summary Sub Supine Side Prone Upright  Total (4%) 3 Total '3 2 1 '$ 0 0    REM 1 1 0 0 0    NREM '2 1 1 '$ 0 0     AHI Total Obs Mix Cen  2.11 Apnea 0.00 0.00 0.00 0.00   Hypopnea 2.11 -- -- --  0.79 Hypopnea (4%) 0.79 -- -- --    Total Supine Side Prone Upright  Position AHI 2.11 1.63 4.04 0.00 0.00  REM AHI 10.91   NREM AHI 1.17   Position RDI 2.11 1.63 4.04 0.00 0.00  REM RDI 10.91   NREM RDI 1.17    4%  Hypopnea Total Supine Side Prone Upright  Position AHI (4%) 0.79 0.65 1.35 0.00 0.00  REM AHI (4%) 2.73   NREM AHI (4%) 0.58   Position RDI (4%) 0.79 0.65 1.35 0.00 0.00  REM RDI (4%) 2.73   NREM RDI (4%) 0.58    Desaturation Information  <100% <90% <80% <70% <60% <50% <40%  Supine 3 2 0 0 0 0 0  Side 3 1 0 0 0 0 0  Prone 0 0 0 0 0 0 0  Upright 0 0 0 0 0 0 0  Total 6 3 0 0 0 0 0  Desaturation threshold setting: 4% Minimum desaturation setting: 10 seconds SaO2 nadir: 81% The longest event was a 47 sec obstructive Hypopnea with a minimum SaO2 of 87%. The lowest SaO2 was 81% associated with a 15 sec obstructive Hypopnea. EKG Rates EKG Avg Max Min  Awake 68  82 57  Asleep 62 78 51  EKG Events: N/A Awakening/Arousal Information # of Awakenings 10  Wake after sleep onset 13.66m Wake after persistent sleep 13.022m Arousal Assoc. Arousals Index  Apneas 0 0.0  Hypopneas 1 0.3  Leg Movements 5 1.3  Snore 0.0 0.0  PTT Arousals 0 0.0  Spontaneous 13 3.4  Total 19 5.0  Myoclonus Information PLMS LMs Index  Total LMs during PLMS 0 0.0  LMs w/ Microarousals 0 0.0   LM LMs Index  w/ Microarousal 5 1.3  w/ Awakening 2 0.5  w/ Resp Event 0 0.0  Spontaneous 5 1.3  Total 10 2.6

## 2022-11-18 NOTE — Addendum Note (Signed)
Addended by: Larey Seat on: 11/18/2022 05:39 PM   Modules accepted: Orders

## 2022-11-20 ENCOUNTER — Telehealth: Payer: Self-pay | Admitting: Neurology

## 2022-11-20 NOTE — Telephone Encounter (Signed)
Called patient to discuss sleep study results. No answer at this time. LVM for the patient to call back.   

## 2022-11-20 NOTE — Telephone Encounter (Signed)
-----   Message from Larey Seat, MD sent at 11/18/2022  5:39 PM EST ----- Patient did excellent with only 6 cm water pressure, had at baseline mild OSA.  ResMed nasal pillow P 10 mask.  A ResMed device with heated humidification for auto titration set at 5- 8 cm water pressure without EPR will be ordered.

## 2022-11-21 NOTE — Telephone Encounter (Signed)
Pt returned call. I advised pt that Dr. Brett Fairy reviewed their sleep study results and found that pt best treated with CPAP at a pressure of 6cm. Dr. Brett Fairy recommends that pt starts auto CPAP. I reviewed PAP compliance expectations with the pt. Pt is agreeable to starting a CPAP. I advised pt that an order will be sent to a DME, [facility], and [facility] will call the pt within about one week after they file with the pt's insurance. [facility] will show the pt how to use the machine, fit for masks, and troubleshoot the CPAP if needed. A follow up appt was made for insurance purposes with Debbora Presto, NP on 01/28/23 at 3:30 pm. Pt verbalized understanding to arrive 15 minutes early and bring their CPAP. Pt verbalized understanding of results. Pt had no questions at this time but was encouraged to call back if questions arise. I have sent the order to [facility] and have received confirmation that they have received the order.

## 2023-01-13 ENCOUNTER — Ambulatory Visit: Payer: BC Managed Care – PPO | Admitting: Nurse Practitioner

## 2023-01-16 ENCOUNTER — Ambulatory Visit: Payer: BC Managed Care – PPO | Admitting: Nurse Practitioner

## 2023-01-16 ENCOUNTER — Encounter: Payer: Self-pay | Admitting: Nurse Practitioner

## 2023-01-16 VITALS — BP 148/90 | HR 63 | Temp 97.5°F | Ht 73.0 in | Wt 210.4 lb

## 2023-01-16 DIAGNOSIS — E782 Mixed hyperlipidemia: Secondary | ICD-10-CM | POA: Diagnosis not present

## 2023-01-16 DIAGNOSIS — K760 Fatty (change of) liver, not elsewhere classified: Secondary | ICD-10-CM

## 2023-01-16 DIAGNOSIS — E663 Overweight: Secondary | ICD-10-CM

## 2023-01-16 DIAGNOSIS — N138 Other obstructive and reflux uropathy: Secondary | ICD-10-CM

## 2023-01-16 DIAGNOSIS — E559 Vitamin D deficiency, unspecified: Secondary | ICD-10-CM

## 2023-01-16 DIAGNOSIS — G4733 Obstructive sleep apnea (adult) (pediatric): Secondary | ICD-10-CM

## 2023-01-16 DIAGNOSIS — N401 Enlarged prostate with lower urinary tract symptoms: Secondary | ICD-10-CM | POA: Diagnosis not present

## 2023-01-16 DIAGNOSIS — B351 Tinea unguium: Secondary | ICD-10-CM

## 2023-01-16 DIAGNOSIS — I10 Essential (primary) hypertension: Secondary | ICD-10-CM | POA: Diagnosis not present

## 2023-01-16 DIAGNOSIS — K439 Ventral hernia without obstruction or gangrene: Secondary | ICD-10-CM

## 2023-01-16 DIAGNOSIS — R7309 Other abnormal glucose: Secondary | ICD-10-CM

## 2023-01-16 DIAGNOSIS — Z79899 Other long term (current) drug therapy: Secondary | ICD-10-CM

## 2023-01-16 DIAGNOSIS — N200 Calculus of kidney: Secondary | ICD-10-CM

## 2023-01-16 NOTE — Progress Notes (Signed)
Follow Up  Assessment and Plan:  Essential hypertension Continue bASA, bisoprolol-hctz,  Discussed DASH (Dietary Approaches to Stop Hypertension) DASH diet is lower in sodium than a typical American diet. Cut back on foods that are high in saturated fat, cholesterol, and trans fats. Eat more whole-grain foods, fish, poultry, and nuts Remain active and exercise as tolerated daily.  Monitor BP at home-Call if greater than 130/80.  Check CMP/CBC   OSA on CPAP Continue CPAP.  Updated sleep study completed  BPH with obstruction/lower urinary tract symptoms Controlled. Continue Proscar  Continue to monitor Check PSA during CPE  Hyperlipidemia, mixed Continue fenofibrate, rosuvastatin Discussed lifestyle modifications. Recommended diet heavy in fruits and veggies, omega 3's. Decrease consumption of animal meats, cheeses, and dairy products. Remain active and exercise as tolerated. Continue to monitor. Check lipids/TSH  Abnormal glucose (prediabetes) Education: Reviewed 'ABCs' of diabetes management  Discussed goals to be met and/or maintained include A1C (<7) Blood pressure (<130/80) Cholesterol (LDL <70) Continue Eye Exam yearly  Continue Dental Exam Q6 mo Discussed dietary recommendations Discussed Physical Activity recommendations Check A1C   Medication management All medications discussed and reviewed in full. All questions and concerns regarding medications addressed.     Overweight (BMI 25.0-29.9) Discussed appropriate BMI Diet modification. Physical activity. Encouraged/praised to build confidence.   Onychomycosis Ruled out noted to have nail dystrophy Continue to follow with Podiatry PRN  Ventral Hernia  Ruled out - rectus diastasis with an umbilical hernia Continue to monitor  Nephrolithiasis Attempt to remove stone unsuccessful Rescheduled for 10/18/2022 - Dr. Berneice Heinrich Removed and no current issues Continue to follow with Urology  Hepatitic  Steatosis Monitor LFTs Advised weight control and avoidance of high dose Tylenol  Vitamin D deficiency Continue supplement for goal of 60-100 Monitor Vitamin D levels   Orders Placed This Encounter  Procedures   CBC with Differential/Platelet   COMPLETE METABOLIC PANEL WITH GFR   Lipid panel   Hemoglobin A1c   VITAMIN D 25 Hydroxy (Vit-D Deficiency, Fractures)    Notify office for further evaluation and treatment, questions or concerns if any reported s/s fail to improve.   The patient was advised to call back or seek an in-person evaluation if any symptoms worsen or if the condition fails to improve as anticipated.   Further disposition pending results of labs. Discussed med's effects and SE's.    I discussed the assessment and treatment plan with the patient. The patient was provided an opportunity to ask questions and all were answered. The patient agreed with the plan and demonstrated an understanding of the instructions.  Discussed med's effects and SE's. Screening labs and tests as requested with regular follow-up as recommended.  I provided 25 minutes of face-to-face time during this encounter including counseling, chart review, and critical decision making was preformed.  Today's Plan of Care is based on a patient-centered health care approach known as shared decision making - the decisions, tests and treatments allow for patient preferences and values to be balanced with clinical evidence.      Future Appointments  Date Time Provider Department Center  02/13/2023  8:30 AM Shawnie Dapper, NP [facility] None  07/09/2023  9:00 AM Adela Glimpse, NP [facility] None    HPI  62 y.o. male  presents for a general 3 month follow up. He has Essential hypertension; Hyperlipidemia, mixed; Vitamin D deficiency; OSA on CPAP; Abnormal glucose (prediabetes); Medication management; Overweight (BMI 25.0-29.9); BPH with obstruction/lower urinary tract symptoms; History of adenomatous polyp of  colon; Varicose  veins of lower extremities with complications, bilateral; Family history of abdominal aortic aneurysm (AAA); Aortic atherosclerosis (HCC)- CT 06/27/2021; Hepatic steatosis - CT 06/27/2021; and Colon, diverticulosis on their problem list.  Overall he reports doing well.  Has has of OSA with original sleep study being in [place], 2016.  He had noticed increase in daytime sleepiness.  Recently had f/u with [facility] 10/2022 now has new machine and is managing well.   He has been taking Terbinafine for tmt of Onychomycosis.  Not much improvement since completing course. Most notable 2nd digit right toe with is hard and thick and eyllow.  He did have a decline in kidney fx last check 02/2022 while on the medication.  Liver enzymes have remained WNL.  Was referred to podiatry an noted to have nail dystrophy, no onychomycosis.  Saw general surgery for ventral hernia that has now been diagnosed as rectus diastasis with an umbilical hernia.  They also noted left kidney stone at the time which was attempted to be removed by Urology, Dr. Berneice Heinrich, but was unsuccessful d/t prostate. He has planned to return for second attempt next week.    Patient followed with [facility] on December 19th. 2023. Followed due to evaluation of possible stone event. Due to gross hematuria  for approximately  36 hour period some mild intermittent left flank pain and discomfort. No changes in force of stream or increased frequency urgency from baseline. He denies any correlating fever, stills nausea, vomiting. 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 Given that he is asymptomatic. he will be completing ACT stone protocol study for additional evaluation.  F/U with [facility] on 10/31/22 noted bilateral R>L non-complex renal cysts noted on CT 06/2021.  No mass effect.  He had his cysto stent pulled after recent ureteroscopy to some free for large ureteral  stone.  Successful.  Plan is to f/u 1 year.   BMI is Body mass index is 27.76 kg/m., he has been working on diet and exercise. Wt Readings from Last 3 Encounters:  01/16/23 210 lb 6.4 oz (95.4 kg)  10/18/22 215 lb 3.2 oz (97.6 kg)  10/11/22 215 lb 3.2 oz (97.6 kg)   His blood pressure has been controlled at home, today their BP is BP: (!) 148/90  He does workout. He denies chest pain, shortness of breath, dizziness.   He is concerned for large ventral hernia.  Denies pain, N/V, difficulty swallowing.  Has not had worked up or evaluated in the past.   He is on cholesterol medication and denies myalgias. Reports family hx of AAA, in father, Has never been a smoker.  His cholesterol  is at goal. The cholesterol last visit was:   Lab Results  Component Value Date   CHOL 155 01/16/2023   HDL 50 01/16/2023   LDLCALC 80 01/16/2023   TRIG 157 (H) 01/16/2023   CHOLHDL 3.1 01/16/2023    He has been working on diet and exercise for is not, he is on bASA, he is on ACE/ARB and denies polydipsia and polyuria. Last A1C in the office was:  Lab Results  Component Value Date   HGBA1C 5.9 (H) 01/16/2023   Last GFR: Lab Results  Component Value Date   EGFR 58 (L) 01/16/2023   Patient is on Vitamin D supplement.   Lab Results  Component Value Date   VD25OH 91 01/16/2023     Last PSA was: Lab Results  Component  Value Date   PSA 0.32 07/08/2022     Current Medications:  Current Outpatient Medications on File Prior to Visit  Medication Sig Dispense Refill   aspirin EC 81 MG tablet Take 81 mg by mouth daily. Swallow whole.     bisoprolol-hydrochlorothiazide (ZIAC) 5-6.25 MG tablet Take  1 tablet  Daily for BP                                            /                    TAKE 1 TABLET BY MOUTH 90 tablet 3   Cholecalciferol (VITAMIN D-3 PO) Take 10,000 Units by mouth daily.      Cinnamon 500 MG capsule Take 1,000 mg by mouth 2 (two) times daily.     fenofibrate (TRICOR) 145 MG tablet  TAKE 1 TABLET BY MOUTH DAILY FOR TRIGLYCERIDES 90 tablet 2   finasteride (PROSCAR) 5 MG tablet Take 5 mg by mouth daily.     fluticasone (FLONASE) 50 MCG/ACT nasal spray SHAKE LIQUID AND USE 1 TO 2 SPRAYS IN EACH NOSTRIL 1 TO 2 TIMES PER DAY (Patient taking differently: Place 1 spray into both nostrils at bedtime.) 48 g 3   Multiple Vitamin (MULTIVITAMIN) tablet Take 1 tablet by mouth daily.     mupirocin cream (BACTROBAN) 2 % Apply to affected area 3 times daily 30 g 0   Omega-3 Fatty Acids (FISH OIL) 1000 MG CPDR Take 2,000 mg by mouth 2 (two) times daily.     rosuvastatin (CRESTOR) 40 MG tablet Take 1 tablet (40 mg total) by mouth daily. 90 tablet 3   zinc gluconate 50 MG tablet Take 50 mg by mouth daily.     oxyCODONE (ROXICODONE) 5 MG immediate release tablet Take 1 tablet (5 mg total) by mouth every 6 (six) hours as needed for moderate pain or severe pain. Post-operatively 15 tablet 0   promethazine-dextromethorphan (PROMETHAZINE-DM) 6.25-15 MG/5ML syrup Take 5 mLs by mouth 4 (four) times daily as needed for cough. 240 mL 0   senna-docusate (SENOKOT-S) 8.6-50 MG tablet Take 1 tablet by mouth 2 (two) times daily. While taking strong pain meds to prevent constipation 10 tablet 0   triamcinolone ointment (KENALOG) 0.1 % Apply 1 Application topically 2 (two) times daily. (Patient not taking: Reported on 01/16/2023) 80 g 1   No current facility-administered medications on file prior to visit.   Allergies:  No Known Allergies Medical History:  He has Essential hypertension; Hyperlipidemia, mixed; Vitamin D deficiency; OSA on CPAP; Abnormal glucose (prediabetes); Medication management; Overweight (BMI 25.0-29.9); BPH with obstruction/lower urinary tract symptoms; History of adenomatous polyp of colon; Varicose veins of lower extremities with complications, bilateral; Family history of abdominal aortic aneurysm (AAA); Aortic atherosclerosis (HCC)- CT 06/27/2021; Hepatic steatosis - CT 06/27/2021; and  Colon, diverticulosis on their problem list.  Health Maintenance:   Immunization History  Administered Date(s) Administered   Influenza Inj Mdck Quad Pf 08/10/2020   Influenza Inj Mdck Quad With Preservative 09/03/2017, 07/28/2018   Influenza Split 08/23/2014   Influenza Whole 08/09/2013   Influenza, Seasonal, Injecte, Preservative Fre 07/30/2016   Influenza-Unspecified 08/01/2019, 08/10/2020, 08/23/2021   PFIZER(Purple Top)SARS-COV-2 Vaccination 12/30/2019, 01/20/2020, 07/31/2020, 03/30/2021, 10/01/2021   PPD Test 08/23/2014, 10/18/2015, 01/29/2017, 04/09/2018, 05/10/2019, 05/17/2020   Pneumococcal Conjugate-13 08/23/2014   Tdap 01/17/2016   Zoster Recombinat (  Shingrix) 09/12/2019, 11/20/2019   Health Maintenance  Topic Date Due   COVID-19 Vaccine (6 - 2023-24 season) 06/14/2022   INFLUENZA VACCINE  05/15/2023   DTaP/Tdap/Td (2 - Td or Tdap) 01/16/2026   COLONOSCOPY (Pts 45-29yrs Insurance coverage will need to be confirmed)  07/28/2027   Hepatitis C Screening  Completed   Zoster Vaccines- Shingrix  Completed   HPV VACCINES  Aged Out   HIV Screening  Discontinued     Surgical History:  He has a past surgical history that includes Wisdom tooth extraction (1976); Nasal septum surgery; Incision and drainage perirectal abscess; Melanoma excision (2009); Hernia repair (Left, groin); Hernia repair (Right, groin); Shoulder arthroscopy (Left, 2003); melanona (2009); Colonoscopy (04/13/2010); Tissue graft; Cystoscopy/ureteroscopy/holmium laser/stent placement (Left, 07/13/2021); Transurethral resection of prostate (07/13/2021); Endovenous ablation saphenous vein w/ laser (Left, 08/08/2021); Incision and drainage perirectal abscess (1998); and Cystoscopy/ureteroscopy/holmium laser/stent placement (Left, 10/18/2022). Family History:  Hisfamily history includes AAA (abdominal aortic aneurysm) in his father and paternal aunt; Aneurysm (age of onset: 2) in his mother; CAD in his father and  paternal aunt; Heart attack in his paternal uncle; Kidney disease in his father; Kidney failure in his father; Prostate cancer in his father. Social History:  He reports that he has never smoked. He has never used smokeless tobacco. He reports that he does not drink alcohol and does not use drugs.  Review of Systems: Review of Systems  Constitutional: Negative.   HENT: Negative.    Eyes: Negative.   Respiratory: Negative.    Cardiovascular: Negative.   Gastrointestinal:  Negative for abdominal pain, blood in stool, constipation, diarrhea, heartburn, nausea and vomiting.  Genitourinary: Negative.   Musculoskeletal: Negative.   Skin: Negative.   Neurological: Negative.   Psychiatric/Behavioral: Negative.      Physical Exam: Estimated body mass index is 27.76 kg/m as calculated from the following:   Height as of this encounter:  (1.854 m).   Weight as of this encounter: 210 lb 6.4 oz (95.4 kg). BP (!) 148/90   Pulse 63   Temp (!) 97.5 F (36.4 C)   Ht  (1.854 m)   Wt 210 lb 6.4 oz (95.4 kg)   SpO2 99%   BMI 27.76 kg/m   General Appearance: Well nourished, well developed, in no apparent distress.  Eyes: PERRLA, EOMs, conjunctiva no swelling or erythema, normal fundi and vessels.  Sinuses: No Frontal/maxillary tenderness  ENT/Mouth: Ext aud canals clear, normal light reflex with TMs without erythema, bulging. Good dentition. No erythema, swelling, or exudate on post pharynx. Tonsils not swollen or erythematous. Hearing normal.  Neck: Supple, thyroid normal. No bruits  Respiratory: Respiratory effort normal, BS equal bilaterally without rales, rhonchi, wheezing or stridor.  Cardio: RRR without murmurs, rubs or gallops. Brisk peripheral pulses without edema.  Chest: symmetric, with normal excursions and percussion.  Abdomen: Soft, nontender, no guarding, rebound, hernias, masses, or organomegaly.  Lymphatics: Non tender without lymphadenopathy.  Musculoskeletal: Full  ROM all peripheral extremities,5/5 strength, and normal gait.  Skin: Warm, dry without rashes, lesions, ecchymosis. Neuro: Cranial nerves intact, reflexes equal bilaterally. Normal muscle tone, no cerebellar symptoms. Sensation intact.  Psych: Awake and oriented X 3, normal affect, Insight and Judgment appropriate.  Genitourinary: Defer to Urology   Adela Glimpse, NP 7:49 PM [facility]

## 2023-01-17 LAB — COMPLETE METABOLIC PANEL WITH GFR
AG Ratio: 1.9 (calc) (ref 1.0–2.5)
ALT: 21 U/L (ref 9–46)
AST: 21 U/L (ref 10–35)
Albumin: 4.6 g/dL (ref 3.6–5.1)
Alkaline phosphatase (APISO): 43 U/L (ref 35–144)
BUN/Creatinine Ratio: 21 (calc) (ref 6–22)
BUN: 29 mg/dL — ABNORMAL HIGH (ref 7–25)
CO2: 31 mmol/L (ref 20–32)
Calcium: 10.1 mg/dL (ref 8.6–10.3)
Chloride: 101 mmol/L (ref 98–110)
Creat: 1.37 mg/dL — ABNORMAL HIGH (ref 0.70–1.35)
Globulin: 2.4 g/dL (calc) (ref 1.9–3.7)
Glucose, Bld: 92 mg/dL (ref 65–99)
Potassium: 4.5 mmol/L (ref 3.5–5.3)
Sodium: 139 mmol/L (ref 135–146)
Total Bilirubin: 0.5 mg/dL (ref 0.2–1.2)
Total Protein: 7 g/dL (ref 6.1–8.1)
eGFR: 58 mL/min/{1.73_m2} — ABNORMAL LOW (ref >=60)

## 2023-01-17 LAB — VITAMIN D 25 HYDROXY (VIT D DEFICIENCY, FRACTURES): Vit D, 25-Hydroxy: 91 ng/mL (ref 30–100)

## 2023-01-17 LAB — LIPID PANEL
Cholesterol: 155 mg/dL (ref <200)
HDL: 50 mg/dL (ref >=40)
LDL Cholesterol (Calc): 80 mg/dL (calc)
Non-HDL Cholesterol (Calc): 105 mg/dL (calc) (ref <130)
Total CHOL/HDL Ratio: 3.1 (calc) (ref <5.0)
Triglycerides: 157 mg/dL — ABNORMAL HIGH (ref <150)

## 2023-01-17 LAB — CBC WITH DIFFERENTIAL/PLATELET
Absolute Monocytes: 558 cells/uL (ref 200–950)
Basophils Absolute: 60 cells/uL (ref 0–200)
Basophils Relative: 1 %
Eosinophils Absolute: 48 cells/uL (ref 15–500)
Eosinophils Relative: 0.8 %
HCT: 39.4 % (ref 38.5–50.0)
Hemoglobin: 13.4 g/dL (ref 13.2–17.1)
Lymphs Abs: 1542 cells/uL (ref 850–3900)
MCH: 30.2 pg (ref 27.0–33.0)
MCHC: 34 g/dL (ref 32.0–36.0)
MCV: 88.9 fL (ref 80.0–100.0)
MPV: 9.9 fL (ref 7.5–12.5)
Monocytes Relative: 9.3 %
Neutro Abs: 3792 cells/uL (ref 1500–7800)
Neutrophils Relative %: 63.2 %
Platelets: 338 10*3/uL (ref 140–400)
RBC: 4.43 10*6/uL (ref 4.20–5.80)
RDW: 12 % (ref 11.0–15.0)
Total Lymphocyte: 25.7 %
WBC: 6 10*3/uL (ref 3.8–10.8)

## 2023-01-17 LAB — HEMOGLOBIN A1C
Hgb A1c MFr Bld: 5.9 % of total Hgb — ABNORMAL HIGH (ref <5.7)
Mean Plasma Glucose: 123 mg/dL
eAG (mmol/L): 6.8 mmol/L

## 2023-01-19 NOTE — Patient Instructions (Signed)

## 2023-01-28 ENCOUNTER — Other Ambulatory Visit: Payer: Self-pay | Admitting: Internal Medicine

## 2023-01-28 ENCOUNTER — Ambulatory Visit: Payer: BC Managed Care – PPO | Admitting: Family Medicine

## 2023-02-12 NOTE — Patient Instructions (Incomplete)
Please continue using your CPAP regularly. While your insurance requires that you use CPAP at least 4 hours each night on 70% of the nights, I recommend, that you not skip any nights and use it throughout the night if you can. Getting used to CPAP and staying with the treatment long term does take time and patience and discipline. Untreated obstructive sleep apnea when it is moderate to severe can have an adverse impact on cardiovascular health and raise her risk for heart disease, arrhythmias, hypertension, congestive heart failure, stroke and diabetes. Untreated obstructive sleep apnea causes sleep disruption, nonrestorative sleep, and sleep deprivation. This can have an impact on your day to day functioning and cause daytime sleepiness and impairment of cognitive function, memory loss, mood disturbance, and problems focussing. Using CPAP regularly can improve these symptoms.  You could consider adjusting humidity on new machine to see if this helps with dry mouth. A chin strap or CPAP tape are often helpful for mouth breathers. Biotene products sold over the counter can also be helpful.   Follow up in 1 year

## 2023-02-12 NOTE — Progress Notes (Unsigned)
PATIENT: Tony Bailey DOB: January 30, 1961  REASON FOR VISIT: follow up HISTORY FROM: patient  No chief complaint on file.    HISTORY OF PRESENT ILLNESS:  02/12/23 ALL:  Tony Bailey is a 62 y.o. male here today for follow up for OSA on CPAP.  He was seen n consult with Dr Vickey Huger 07/2022. He had used CPAP therapy since 2006 and in need of new machine. Split night 10/2022 showed mild OSA managed well with CPAP at 6cmH20.     HISTORY: (copied from Dr Dohmeier's previous note)  Tony Bailey is a 62 y.o. year old White or Caucasian male patient seen here as a referral on 08/07/2022 from Dr Karma Lew, MD - and his NP, for a sleep consultation. .  Chief concern according to patient :  patient uses a CPAP since 2006, still on his first machine.[facility].    Tony Bailey   has a past medical history of Anesthesia complication, Anxiety, Bleeding from varicose vein, Hyperlipidemia, Hypertension, Kidney stones, Melanoma (HCC) (2009), OSA (obstructive sleep apnea),  Prediabetes (05/09/2019), Sciatic nerve pain (2010), Testosterone deficiency (10/13/2013), Unspecified vitamin D deficiency, and Venous insufficiency.   The patient had the first sleep study in the year 2006, the physician had left the community and so the patient had no follow up. Christoper Allegra gives supplies.  Sleep relevant medical history:  deviated septum repair, prostate ectomy TURP, Kidney stones,     Family medical /sleep history: father likely had OSA- passed at 58 , in 2010- CHF, CAD, Anemia,  ESRD.    Social history:  Patient is working as a Public librarian- and lives in a household with spouse and dog-  no children.  The patient currently works daytime.  Tobacco use; none .  ETOH use - none   Caffeine intake in form of Coffee( 1-2 ) Soda( diet 1-2 day) Tea ( 1  day) -no energy drinks. Regular exercise in form of  walking, gardening. .     Sleep habits are as follows: The patient's dinner time is between  5.30-6 PM. The patient goes to bed at 11 PM but he may have slept already in front of the TV. He  continues to sleep for 7 hours, wakes for one bathroom breaks, the first time at 2-3 AM.   The preferred sleep position is left laterally, with the support of 1 pillow, flat bed-. Dreams are reportedly frequent/vivid.  6.45 AM is the usual rise time. The patient wakes up at 6. 20 with an alarm.  He reports  feeling refreshed/ restored in AM, as long as he uses his CPAP- before CPAP he had symptoms such as dry mouth/ drooling, vertigo, loud snoring, no morning headaches but  residual fatigue.  Naps are taken infrequently, lasting from 15 to 30 minutes in late PM, in front of the TV.   REVIEW OF SYSTEMS: Out of a complete 14 system review of symptoms, the patient complains only of the following symptoms, and all other reviewed systems are negative.  ESS:  ALLERGIES: No Known Allergies  HOME MEDICATIONS: Outpatient Medications Prior to Visit  Medication Sig Dispense Refill   aspirin EC 81 MG tablet Take 81 mg by mouth daily. Swallow whole.     bisoprolol-hydrochlorothiazide (ZIAC) 5-6.25 MG tablet Take  1 tablet  Daily for BP                                            /  TAKE 1 TABLET BY MOUTH 90 tablet 3   Cholecalciferol (VITAMIN D-3 PO) Take 10,000 Units by mouth daily.      Cinnamon 500 MG capsule Take 1,000 mg by mouth 2 (two) times daily.     fenofibrate (TRICOR) 145 MG tablet TAKE 1 TABLET BY MOUTH DAILY FOR TRIGLYCERIDES 90 tablet 2   finasteride (PROSCAR) 5 MG tablet Take 5 mg by mouth daily.     fluticasone (FLONASE) 50 MCG/ACT nasal spray SHAKE LIQUID AND USE 1 TO 2 SPRAYS IN EACH NOSTRIL 1 TO 2 TIMES PER DAY 48 g 3   Multiple Vitamin (MULTIVITAMIN) tablet Take 1 tablet by mouth daily.     mupirocin cream (BACTROBAN) 2 % Apply to affected area 3 times daily 30 g 0   Omega-3 Fatty Acids (FISH OIL) 1000 MG CPDR Take 2,000 mg by mouth 2 (two) times daily.     oxyCODONE  (ROXICODONE) 5 MG immediate release tablet Take 1 tablet (5 mg total) by mouth every 6 (six) hours as needed for moderate pain or severe pain. Post-operatively 15 tablet 0   promethazine-dextromethorphan (PROMETHAZINE-DM) 6.25-15 MG/5ML syrup Take 5 mLs by mouth 4 (four) times daily as needed for cough. 240 mL 0   rosuvastatin (CRESTOR) 40 MG tablet Take 1 tablet (40 mg total) by mouth daily. 90 tablet 3   senna-docusate (SENOKOT-S) 8.6-50 MG tablet Take 1 tablet by mouth 2 (two) times daily. While taking strong pain meds to prevent constipation 10 tablet 0   triamcinolone ointment (KENALOG) 0.1 % Apply 1 Application topically 2 (two) times daily. (Patient not taking: Reported on 01/16/2023) 80 g 1   zinc gluconate 50 MG tablet Take 50 mg by mouth daily.     No facility-administered medications prior to visit.    PAST MEDICAL HISTORY: Past Medical History:  Diagnosis Date   Anesthesia complication    Pt stated that years ago when he was having surgery for a perirectal abcess his heart stopped. He stated that he was told it was due to the position he was in and the anesthesia going to his heart. He said they were able to get him back quickly. Since then he has had surgeries with no complications.   Anxiety    Bleeding from varicose vein    History of kidney stones    Hyperlipidemia    Hypertension    Melanoma (HCC) 2009   upper back mid right side   OSA (obstructive sleep apnea)    Other testicular hypofunction    Pre-diabetes    Prediabetes 05/09/2019   Sciatic nerve pain 2010   hx - no current problems per patient 07/06/20   Sleep apnea    uses cpap nightly   Testosterone deficiency 10/13/2013   Unspecified vitamin D deficiency    Venous insufficiency    bilateral    PAST SURGICAL HISTORY: Past Surgical History:  Procedure Laterality Date   COLONOSCOPY  04/13/2010   [facility]    CYSTOSCOPY/URETEROSCOPY/HOLMIUM LASER/STENT PLACEMENT Left 07/13/2021   Procedure: CYSTOSCOPY  BILATERAL RETROGRADE PYELOGRAM/CYSTOLITHALOPAXY;  Surgeon: Sebastian Ache, MD;  Location: [facility];  Service: Urology;  Laterality: Left;   CYSTOSCOPY/URETEROSCOPY/HOLMIUM LASER/STENT PLACEMENT Left 10/18/2022   Procedure: CYSTOSCOPY LEFT RETROGRADE PYELOGRAM URETEROSCOPY/HOLMIUM LASER/STENT PLACEMENT;  Surgeon: Sebastian Ache, MD;  Location: [facility];  Service: Urology;  Laterality: Left;   ENDOVENOUS ABLATION SAPHENOUS VEIN W/ LASER Left 08/08/2021   endovenous laser ablation left greater saphenous veins and stab phlebectomy > 20 incisions left leg by  Cari Caraway MD   HERNIA REPAIR Left groin   1970   HERNIA REPAIR Right groin   2011   INCISION AND DRAINAGE PERIRECTAL ABSCESS     x 2   INCISION AND DRAINAGE PERIRECTAL ABSCESS  1998   MELANOMA EXCISION  2009   melanona  2009   upper back -mid right side   NASAL SEPTUM SURGERY     SHOULDER ARTHROSCOPY Left 2003   TISSUE GRAFT     hx of gum grafts   TRANSURETHRAL RESECTION OF PROSTATE  07/13/2021   Procedure: TRANSURETHRAL RESECTION OF THE PROSTATE (TURP);  Surgeon: Sebastian Ache, MD;  Location: [facility];  Service: Urology;;   WISDOM TOOTH EXTRACTION  1976    FAMILY HISTORY: Family History  Problem Relation Age of Onset   Aneurysm Mother 15   AAA (abdominal aortic aneurysm) Father    Kidney disease Father    Kidney failure Father    Prostate cancer Father    CAD Father    AAA (abdominal aortic aneurysm) Paternal Aunt    CAD Paternal Aunt    Heart attack Paternal Uncle        Several paternal uncles with CAD hx   Colon cancer Neg Hx    Stomach cancer Neg Hx    Rectal cancer Neg Hx     SOCIAL HISTORY: Social History   Socioeconomic History   Marital status: Married    Spouse name: Not on file   Number of children: Not on file   Years of education: Not on file   Highest education level: Not on file  Occupational History   Not on file  Tobacco Use   Smoking status: Never    Smokeless tobacco: Never  Vaping Use   Vaping Use: Never used  Substance and Sexual Activity   Alcohol use: No   Drug use: No   Sexual activity: Not on file  Other Topics Concern   Not on file  Social History Narrative   Not on file   Social Determinants of Health   Financial Resource Strain: Not on file  Food Insecurity: Not on file  Transportation Needs: Not on file  Physical Activity: Not on file  Stress: Not on file  Social Connections: Not on file  Intimate Partner Violence: Not on file     PHYSICAL EXAM  There were no vitals filed for this visit. There is no height or weight on file to calculate BMI.  Generalized: Well developed, in no acute distress  Cardiology: normal rate and rhythm, no murmur noted Respiratory: clear to auscultation bilaterally  Neurological examination  Mentation: Alert oriented to time, place, history taking. Follows all commands speech and language fluent Cranial nerve II-XII: Pupils were equal round reactive to light. Extraocular movements were full, visual field were full  Motor: The motor testing reveals 5 over 5 strength of all 4 extremities. Good symmetric motor tone is noted throughout.  Gait and station: Gait is normal.    DIAGNOSTIC DATA (LABS, IMAGING, TESTING) - I reviewed patient records, labs, notes, testing and imaging myself where available.      No data to display           Lab Results  Component Value Date   WBC 6.0 01/16/2023   HGB 13.4 01/16/2023   HCT 39.4 01/16/2023   MCV 88.9 01/16/2023   PLT 338 01/16/2023      Component Value Date/Time   NA 139 01/16/2023 1604   K 4.5 01/16/2023  1604   CL 101 01/16/2023 1604   CO2 31 01/16/2023 1604   GLUCOSE 92 01/16/2023 1604   BUN 29 (H) 01/16/2023 1604   CREATININE 1.37 (H) 01/16/2023 1604   CALCIUM 10.1 01/16/2023 1604   PROT 7.0 01/16/2023 1604   ALBUMIN 4.5 05/20/2017 1555   AST 21 01/16/2023 1604   ALT 21 01/16/2023 1604   ALKPHOS 60 05/20/2017 1555    BILITOT 0.5 01/16/2023 1604   GFRNONAA 47 (L) 10/11/2022 1052   GFRNONAA 61 03/26/2021 1632   GFRAA 71 03/26/2021 1632   Lab Results  Component Value Date   CHOL 155 01/16/2023   HDL 50 01/16/2023   LDLCALC 80 01/16/2023   TRIG 157 (H) 01/16/2023   CHOLHDL 3.1 01/16/2023   Lab Results  Component Value Date   HGBA1C 5.9 (H) 01/16/2023   Lab Results  Component Value Date   VITAMINB12 466 05/17/2020   Lab Results  Component Value Date   TSH 2.19 07/08/2022     ASSESSMENT AND PLAN 62 y.o. year old male  has a past medical history of Anesthesia complication, Anxiety, Bleeding from varicose vein, History of kidney stones, Hyperlipidemia, Hypertension, Melanoma (HCC) (2009), OSA (obstructive sleep apnea), Other testicular hypofunction, Pre-diabetes, Prediabetes (05/09/2019), Sciatic nerve pain (2010), Sleep apnea, Testosterone deficiency (10/13/2013), Unspecified vitamin D deficiency, and Venous insufficiency. here with   No diagnosis found.    Tony Bailey is doing well on CPAP therapy. Compliance report reveals ***. *** was encouraged to continue using CPAP nightly and for greater than 4 hours each night. We will update supply orders as indicated. Risks of untreated sleep apnea review and education materials provided. Healthy lifestyle habits encouraged. *** will follow up in ***, sooner if needed. *** verbalizes understanding and agreement with this plan.    No orders of the defined types were placed in this encounter.    No orders of the defined types were placed in this encounter.     Shawnie Dapper, FNP-C 02/12/2023, 3:19 PM [facility] [address] [place], [place] [address] [phone]

## 2023-02-13 ENCOUNTER — Ambulatory Visit (INDEPENDENT_AMBULATORY_CARE_PROVIDER_SITE_OTHER): Payer: BC Managed Care – PPO | Admitting: Family Medicine

## 2023-02-13 ENCOUNTER — Encounter: Payer: Self-pay | Admitting: Family Medicine

## 2023-02-13 VITALS — BP 146/79 | HR 67 | Ht 74.0 in | Wt 210.2 lb

## 2023-02-13 DIAGNOSIS — G4733 Obstructive sleep apnea (adult) (pediatric): Secondary | ICD-10-CM

## 2023-02-14 DIAGNOSIS — I8393 Asymptomatic varicose veins of bilateral lower extremities: Secondary | ICD-10-CM

## 2023-03-20 IMAGING — CT CT RENAL STONE PROTOCOL
2 of 4 series · 16 of 46 positions shown, 18 images · non-contrast
Comparison: None.

CLINICAL DATA: Left flank pain and microhematuria. History of
kidney stones.

EXAM:
CT ABDOMEN AND PELVIS WITHOUT CONTRAST
TECHNIQUE: Multidetector CT imaging of the abdomen and pelvis was performed
following the standard protocol without IV contrast.

[Series 2: stone full · axial · 0.81mm/px · z∈[-1026,-531]mm · 13 of 109 slices shown, 15 images]
[im 5/109  soft-tissue]
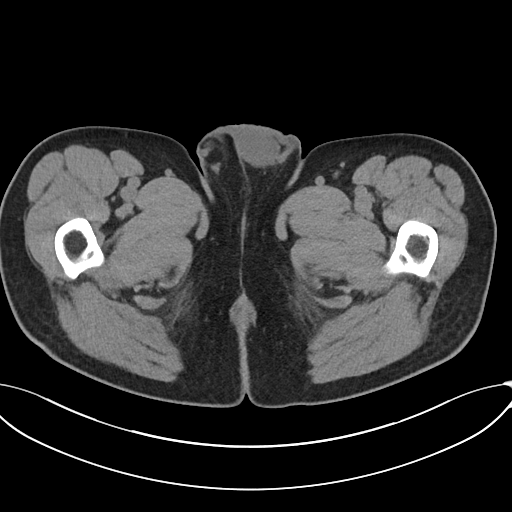
[im 5/109  bone]
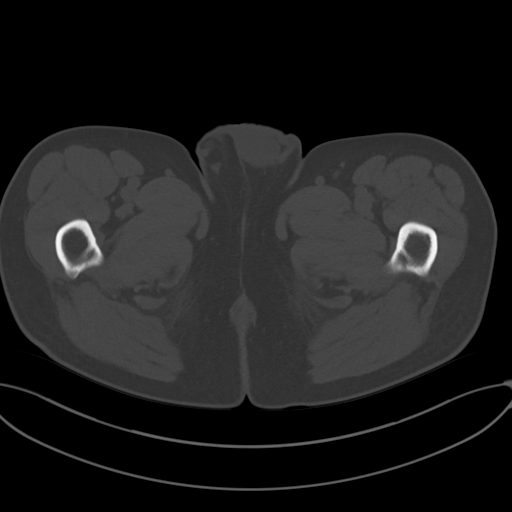
[im 14/109  soft-tissue]
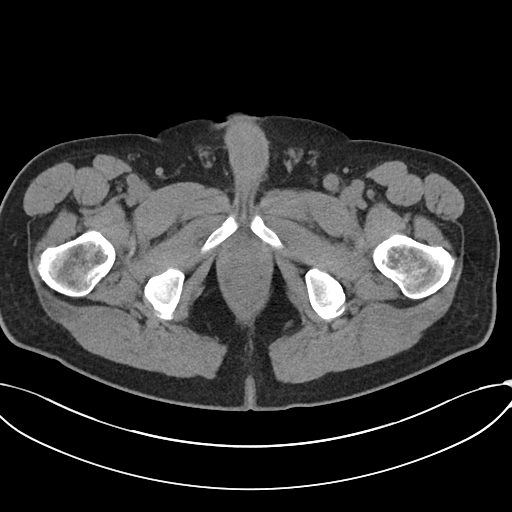
[im 23/109  soft-tissue]
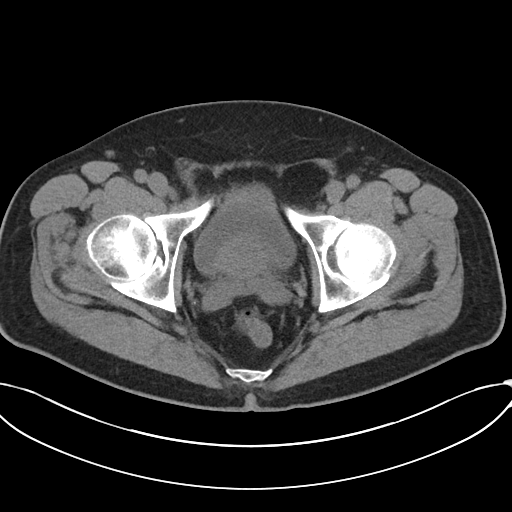
[im 32/109  soft-tissue]
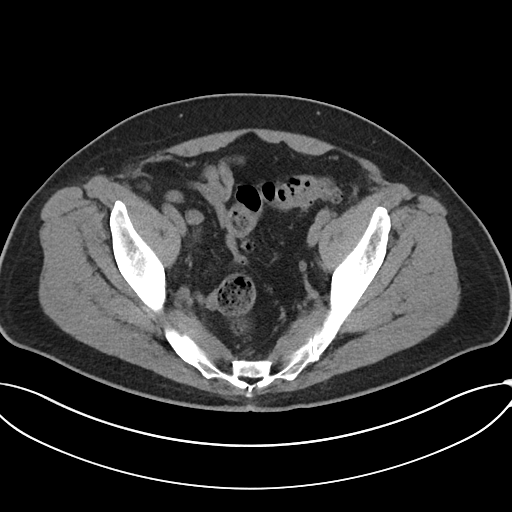
[im 37/109  soft-tissue]
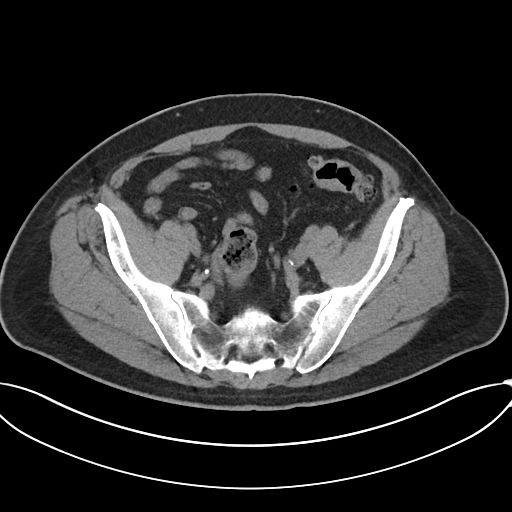
[im 46/109  soft-tissue]
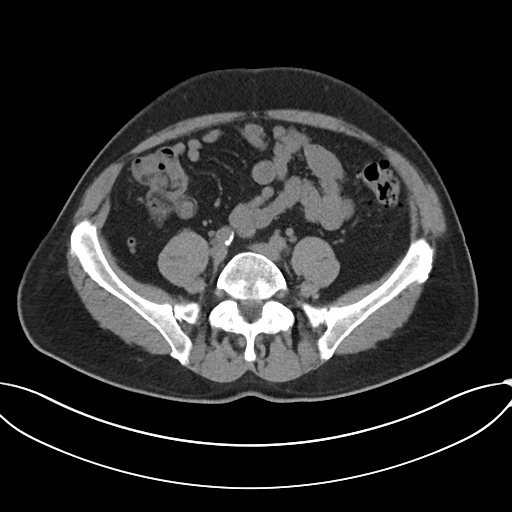
[im 55/109  soft-tissue]
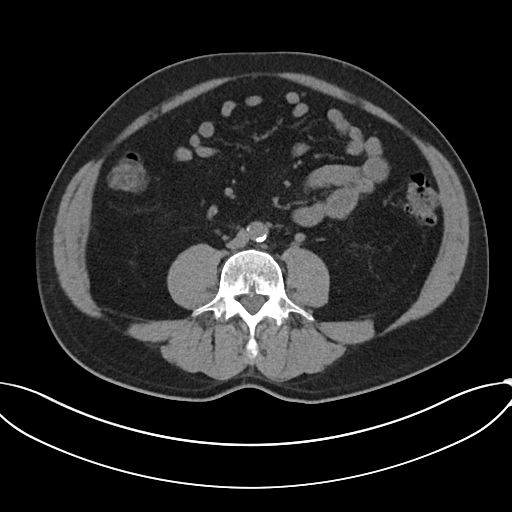
[im 64/109  soft-tissue]
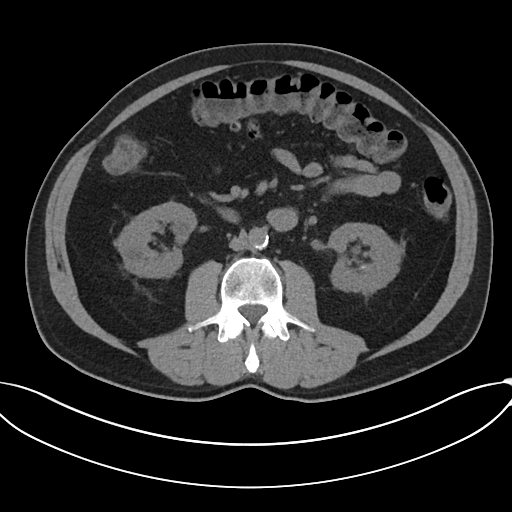
[im 73/109  soft-tissue]
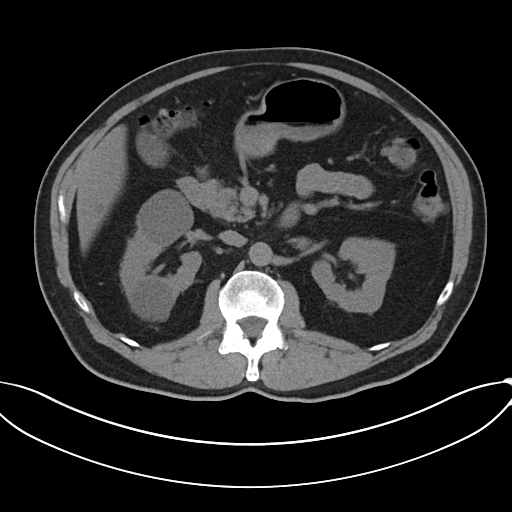
[im 73/109  bone]
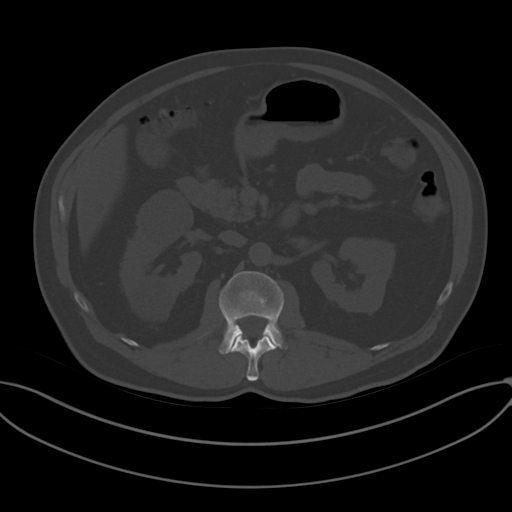
[im 77/109  soft-tissue]
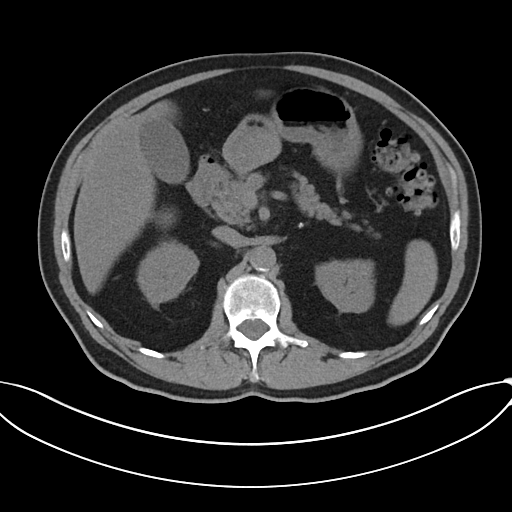
[im 86/109  soft-tissue]
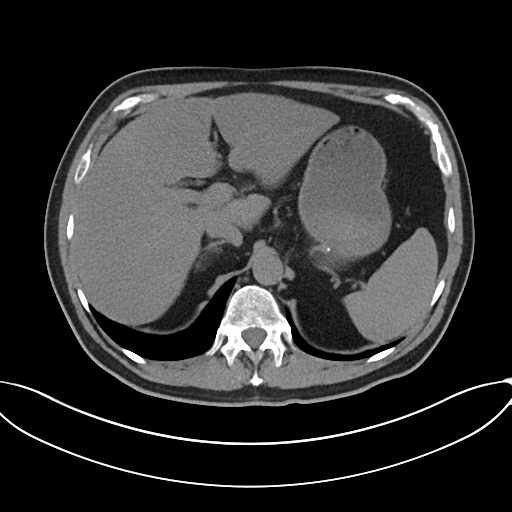
[im 95/109  soft-tissue]
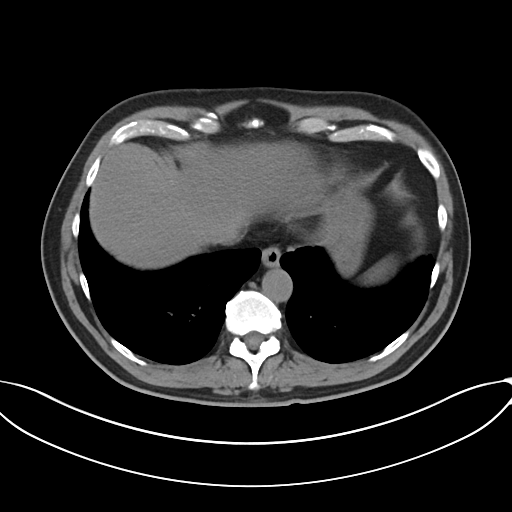
[im 104/109  soft-tissue]
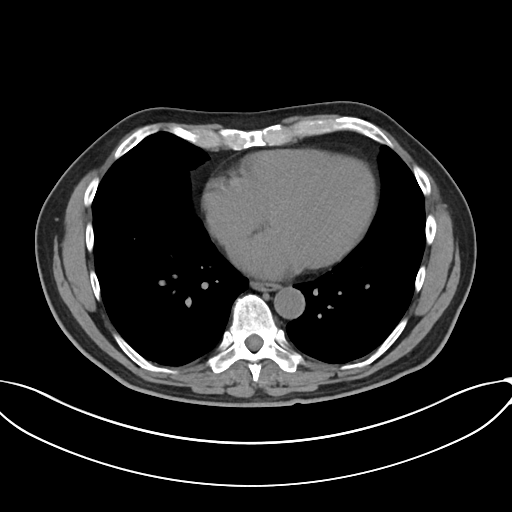

[Series 5: coronal · coronal · 0.83mm/px · 3 of 101 slices shown]
[im 34/101  soft-tissue]
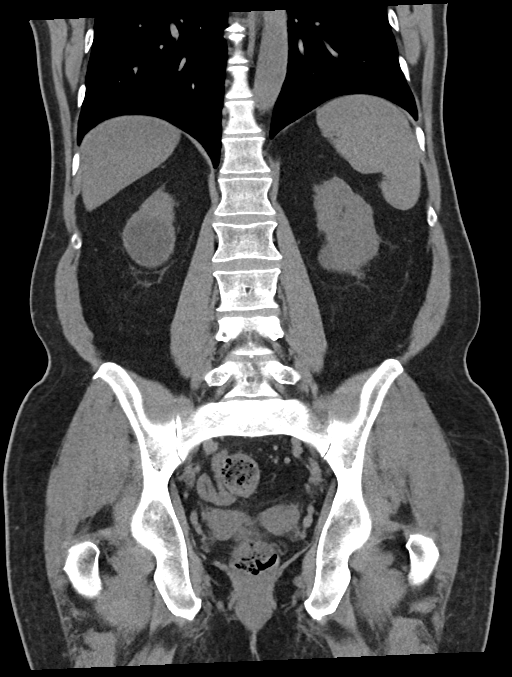
[im 45/101  soft-tissue]
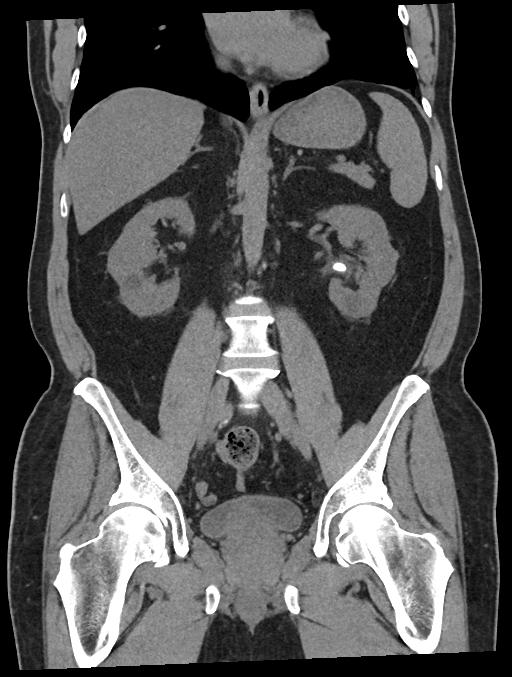
[im 56/101  soft-tissue]
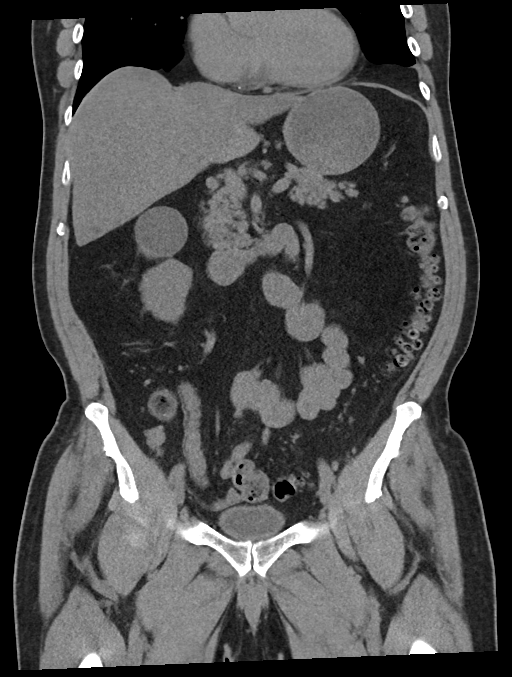

[16 of 46 positions shown; findings below may reference images not displayed]

FINDINGS: Lower chest: No acute abnormality.

Hepatobiliary: Diffusely decreased liver density. Subcentimeter
low-density lesion in the inferior right hepatic lobe, too small to
characterize. The gallbladder is unremarkable. No biliary
dilatation.

Pancreas: Unremarkable. No pancreatic ductal dilatation or
surrounding inflammatory changes.

Spleen: Normal in size without focal abnormality.

Adrenals/Urinary Tract: Tiny left adrenal gland calcification.
Normal right adrenal gland. Bilateral renal simple cysts measuring
up to 4.4 cm. 10 mm calculus in the left renal pelvis additional
punctate calculus in the lower pole. No right renal calculi. No
hydronephrosis. Punctate calculus in the right posterior bladder,
near but beyond the UVJ.

Stomach/Bowel: Stomach is within normal limits. Appendix appears
normal. No evidence of bowel wall thickening, distention, or
inflammatory changes. Diffuse colonic diverticulosis.

Vascular/Lymphatic: Aortic atherosclerosis. No enlarged abdominal or
pelvic lymph nodes.

Reproductive: Prostatomegaly with median lobe hypertrophy indenting
the bladder base.

Other: No abdominal wall hernia or abnormality. No abdominopelvic
ascites. No pneumoperitoneum.

Musculoskeletal: No acute or significant osseous findings.
IMPRESSION: 1. 10 mm calculus in the left renal pelvis. Additional punctate
calculus in the lower pole. No hydronephrosis.
2. Punctate calculus in the right posterior bladder, near but beyond
the UVJ.
3. Hepatic steatosis.
4. Aortic Atherosclerosis ([id number].Z).

These results will be called to the ordering clinician or
representative by the [HOSPITAL] at the imaging location.

## 2023-03-25 ENCOUNTER — Telehealth: Payer: Self-pay

## 2023-03-25 NOTE — Telephone Encounter (Signed)
Returned pt's wife's call regarding scheduling an appt. They were under the impression a 1 yr f/u was advised. I read her MD last note from his 2 week f/u s/p LA. Pt is overall doing well. She is going to talk to him later today and see if he is having any pain/swelling. She will call us back if they feel he needs an appt.

## 2023-05-02 ENCOUNTER — Telehealth: Payer: Self-pay

## 2023-05-02 NOTE — Telephone Encounter (Signed)
Pt called with concerns about jury duty in Sept, as he is one year s/p laser ablation with stabs. He states he is still in compression and needs to frequently ambulate. He is concerned this will be limited if he were to be in jury duty. MD has been messaged and will advise.

## 2023-05-05 ENCOUNTER — Telehealth: Payer: Self-pay

## 2023-05-05 NOTE — Telephone Encounter (Signed)
Per MD, letter provided to pt for jury duty exemption due to his condition. Pt is aware and will come to office to pick this up.

## 2023-05-29 ENCOUNTER — Ambulatory Visit: Payer: BC Managed Care – PPO | Admitting: Nurse Practitioner

## 2023-05-29 ENCOUNTER — Encounter: Payer: Self-pay | Admitting: Family Medicine

## 2023-05-29 ENCOUNTER — Encounter: Payer: Self-pay | Admitting: Nurse Practitioner

## 2023-05-29 VITALS — BP 140/82 | HR 80 | Temp 97.8°F | Ht 73.0 in | Wt 208.2 lb

## 2023-05-29 DIAGNOSIS — U071 COVID-19: Secondary | ICD-10-CM | POA: Diagnosis not present

## 2023-05-29 DIAGNOSIS — Z1152 Encounter for screening for COVID-19: Secondary | ICD-10-CM | POA: Diagnosis not present

## 2023-05-29 DIAGNOSIS — R6889 Other general symptoms and signs: Secondary | ICD-10-CM | POA: Diagnosis not present

## 2023-05-29 LAB — POCT INFLUENZA A/B
Influenza A, POC: NEGATIVE
Influenza B, POC: NEGATIVE

## 2023-05-29 LAB — POC COVID19 BINAXNOW: SARS Coronavirus 2 Ag: POSITIVE — AB

## 2023-05-29 MED ORDER — PROMETHAZINE-DM 6.25-15 MG/5ML PO SYRP: 5.0000 mL | ORAL_SOLUTION | Freq: Four times a day (QID) | ORAL | 0 refills | Status: DC | PRN

## 2023-05-29 MED ORDER — PREDNISONE 10 MG PO TABS: ORAL_TABLET | ORAL | 0 refills | Status: DC

## 2023-05-29 MED ORDER — BENZONATATE 200 MG PO CAPS: ORAL_CAPSULE | ORAL | 1 refills | Status: DC

## 2023-05-29 NOTE — Progress Notes (Signed)
THIS ENCOUNTER IS A VIRTUAL VISIT DUE TO COVID-19 - PATIENT WAS NOT SEEN IN THE OFFICE.  PATIENT HAS CONSENTED TO VIRTUAL VISIT / TELEMEDICINE VISIT   Virtual Visit via telephone Note  I connected with  Tony Bailey on 05/29/2023 by telephone.  I verified that I am speaking with the correct person using two identifiers.    I discussed the limitations of evaluation and management by telemedicine and the availability of in person appointments. The patient expressed understanding and agreed to proceed.  History of Present Illness:  BP (!) 140/82   Pulse 80   Temp 97.8 F (36.6 C)   Ht 6\' 1"  (1.854 m)   Wt 208 lb 3.2 oz (94.4 kg)   SpO2 98%   BMI 27.47 kg/m  62 y.o. patient contacted office reporting URI sx HA, back ache, fever, cough, nasal congestion. he tested positive by in office test. OV was conducted by telephone to minimize exposure. This patient has been vaccinated for covid 19.  Sx began 4 days ago with headache  Treatments tried so far: coricidin and tylenol   Exposures: unknown   Medications   Current Outpatient Medications (Cardiovascular):    bisoprolol-hydrochlorothiazide (ZIAC) 5-6.25 MG tablet, Take  1 tablet  Daily for BP                                            /                    TAKE 1 TABLET BY MOUTH   fenofibrate (TRICOR) 145 MG tablet, TAKE 1 TABLET BY MOUTH DAILY FOR TRIGLYCERIDES   rosuvastatin (CRESTOR) 40 MG tablet, Take 1 tablet (40 mg total) by mouth daily.  Current Outpatient Medications (Respiratory):    fluticasone (FLONASE) 50 MCG/ACT nasal spray, SHAKE LIQUID AND USE 1 TO 2 SPRAYS IN EACH NOSTRIL 1 TO 2 TIMES PER DAY  Current Outpatient Medications (Analgesics):    aspirin EC 81 MG tablet, Take 81 mg by mouth daily. Swallow whole.   Current Outpatient Medications (Other):    Cholecalciferol (VITAMIN D-3 PO), Take 10,000 Units by mouth daily.    Cinnamon 500 MG capsule, Take 1,000 mg by mouth 2 (two) times daily.   finasteride  (PROSCAR) 5 MG tablet, Take 5 mg by mouth daily.   Multiple Vitamin (MULTIVITAMIN) tablet, Take 1 tablet by mouth daily.   Omega-3 Fatty Acids (FISH OIL) 1000 MG CPDR, Take 2,000 mg by mouth 2 (two) times daily.   zinc gluconate 50 MG tablet, Take 50 mg by mouth daily.  Allergies: No Known Allergies  Problem list He has Essential hypertension; Hyperlipidemia, mixed; Vitamin D deficiency; OSA on CPAP; Abnormal glucose (prediabetes); Medication management; Overweight (BMI 25.0-29.9); BPH with obstruction/lower urinary tract symptoms; History of adenomatous polyp of colon; Varicose veins of lower extremities with complications, bilateral; Family history of abdominal aortic aneurysm (AAA); Aortic atherosclerosis (HCC)- CT 06/27/2021; Hepatic steatosis - CT 06/27/2021; and Colon, diverticulosis on their problem list.   Social History:   reports that he has never smoked. He has never used smokeless tobacco. He reports that he does not drink alcohol and does not use drugs.  Observations/Objective:  General : Well sounding patient in no apparent distress HEENT: no hoarseness, no cough for duration of visit Lungs: speaks in complete sentences, no audible wheezing, no apparent distress Neurological: alert, oriented x  3 Psychiatric: pleasant, judgement appropriate   Assessment and Plan:  Covid 19 Covid 19 positive per rapid screening test in office Risk factors include:  has Essential hypertension; Hyperlipidemia, mixed; Vitamin D deficiency; OSA on CPAP; Abnormal glucose (prediabetes); Medication management; Overweight (BMI 25.0-29.9); BPH with obstruction/lower urinary tract symptoms; History of adenomatous polyp of colon; Varicose veins of lower extremities with complications, bilateral; Family history of abdominal aortic aneurysm (AAA); Aortic atherosclerosis (HCC)- CT 06/27/2021; Hepatic steatosis - CT 06/27/2021; and Colon, diverticulosis on their problem list. Symptoms are: mild Due to co morbid  conditions and risk factors, discussed antivirals  Immue support reviewed Vitamin C, Vitamin D, Zinc Take tylenol PRN temp 101+ Push hydration Regular ambulation or calf exercises exercises for clot prevention and 81 mg ASA unless contraindicated Sx supportive therapy suggested Follow up via mychart or telephone if needed Advised patient obtain O2 monitor; present to ED if persistently <90% or with severe dyspnea, CP, fever uncontrolled by tylenol, confusion, sudden decline       Should remain in isolation 5 days from testing positive and then wear a mask when around other people for the following 5 days   COVID/Encounter for screening for COVID-19  - POC COVID-19 Positive   Flu-like symptoms  - POCT Influenza A/B Negative  Meds ordered this encounter  Medications   predniSONE (DELTASONE) 10 MG tablet    Sig: 1 tab 3 x day for 2 days, then 1 tab 2 x day for 2 days, then 1 tab 1 x day for 3 days    Dispense:  13 tablet    Refill:  0    Order Specific Question:   Supervising Provider    Answer:   Lucky Cowboy [phone]   promethazine-dextromethorphan (PROMETHAZINE-DM) 6.25-15 MG/5ML syrup    Sig: Take 5 mLs by mouth 4 (four) times daily as needed for cough.    Dispense:  240 mL    Refill:  0    Order Specific Question:   Supervising Provider    Answer:   Lucky Cowboy [[id number]]   benzonatate (TESSALON) 200 MG capsule    Sig: Take 1 perle 3 x / day to prevent cough    Dispense:  30 capsule    Refill:  1    Order Specific Question:   Supervising Provider    Answer:   Lucky Cowboy [phone]     Follow Up Instructions:  I discussed the assessment and treatment plan with the patient. The patient was provided an opportunity to ask questions and all were answered. The patient agreed with the plan and demonstrated an understanding of the instructions.   The patient was advised to call back or seek an in-person evaluation if the symptoms worsen or if the condition fails to  improve as anticipated.  I provided 15 minutes of non-face-to-face time during this encounter.   Adela Glimpse, NP

## 2023-05-29 NOTE — Patient Instructions (Addendum)

## 2023-06-02 ENCOUNTER — Encounter: Payer: Self-pay | Admitting: Nurse Practitioner

## 2023-06-09 ENCOUNTER — Encounter: Payer: Self-pay | Admitting: Nurse Practitioner

## 2023-06-09 ENCOUNTER — Ambulatory Visit: Payer: BC Managed Care – PPO | Admitting: Nurse Practitioner

## 2023-06-09 ENCOUNTER — Other Ambulatory Visit: Payer: Self-pay

## 2023-06-09 VITALS — BP 158/82 | HR 59 | Temp 97.7°F | Ht 73.0 in | Wt 209.2 lb

## 2023-06-09 DIAGNOSIS — R051 Acute cough: Secondary | ICD-10-CM | POA: Diagnosis not present

## 2023-06-09 DIAGNOSIS — U099 Post covid-19 condition, unspecified: Secondary | ICD-10-CM

## 2023-06-12 NOTE — Progress Notes (Signed)
Assessment and Plan:  Tony Bailey was seen today for an episodic visit.  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 Continue to monitor  Acute cough Stay well hydrated to keep any mucus thin an d productive. Coughing can be cuased by several factors including   breathing in things that bother (irritate) your lungs.  Allergies.  Asthma.  Mucus that runs down the back of your throat (postnasal drip). Discussed how a cough can linger for 3 weeks. Watch for any changes in your cough and contact office if noticed including blood, pus, pain, night sweats. Cover your mouth when you cough. If the air is dry, use a cool mist vaporizer or humidifier in your home. If your cough is worse at night, try using extra pillows to raise your head up higher while you sleep. Call 911 or report to ER if you start to have difficulty breathing.   Notify office for further evaluation and treatment, questions or concerns if s/s fail to improve. The risks and benefits of my recommendations, as well as other treatment options were discussed with the patient today. Questions were answered.  Further disposition pending results of labs. Discussed med's effects and SE's.    Over 15 minutes of exam, counseling, chart review, and critical decision making was performed.   Future Appointments  Date Time Provider Department Center  07/09/2023  9:00 AM Adela Glimpse, NP [facility] None  02/12/2024  9:00 AM Lomax, Amy, NP [facility] None    ------------------------------------------------------------------------------------------------------------------   HPI BP (!) 158/82   Pulse (!) 59   Temp 97.7 F (36.5 C)   Ht 6\' 1"  (1.854 m)   Wt 209 lb 3.2 oz (94.9 kg)   SpO2 98%   BMI 27.60 kg/m   Patient complains of nonproductive cough. Symptoms began 2 weeks ago. Symptoms have been gradually improving since that time.The cough is dry and hoarse and is  aggravated by  night, laying down . Associated symptoms include:  none . Patient does not have a history of asthma. Patient does have a history of environmental allergens. Patient has not traveled recently but was diagnosed with Covid x 2 week ago. Patient does not have a history of smoking. Patient has not had a previous chest x-ray. Patient has not had a PPD done.   Past Medical History:  Diagnosis Date   Anesthesia complication    Pt stated that years ago when he was having surgery for a perirectal abcess his heart stopped. He stated that he was told it was due to the position he was in and the anesthesia going to his heart. He said they were able to get him back quickly. Since then he has had surgeries with no complications.   Anxiety    Bleeding from varicose vein    History of kidney stones    Hyperlipidemia    Hypertension    Melanoma (HCC) 2009   upper back mid right side   OSA (obstructive sleep apnea)    Other testicular hypofunction    Pre-diabetes    Prediabetes 05/09/2019   Sciatic nerve pain 2010   hx - no current problems per patient 07/06/20   Sleep apnea    uses cpap nightly   Testosterone deficiency 10/13/2013   Unspecified vitamin D deficiency    Venous insufficiency    bilateral     No Known Allergies  Current Outpatient Medications on File Prior to Visit  Medication Sig   aspirin  EC 81 MG tablet Take 81 mg by mouth daily. Swallow whole.   benzonatate (TESSALON) 200 MG capsule Take 1 perle 3 x / day to prevent cough   bisoprolol-hydrochlorothiazide (ZIAC) 5-6.25 MG tablet Take  1 tablet  Daily for BP                                            /                    TAKE 1 TABLET BY MOUTH   Cholecalciferol (VITAMIN D-3 PO) Take 10,000 Units by mouth daily.    Cinnamon 500 MG capsule Take 1,000 mg by mouth 2 (two) times daily.   fenofibrate (TRICOR) 145 MG tablet TAKE 1 TABLET BY MOUTH DAILY FOR TRIGLYCERIDES   finasteride (PROSCAR) 5 MG tablet Take 5 mg by mouth  daily.   fluticasone (FLONASE) 50 MCG/ACT nasal spray SHAKE LIQUID AND USE 1 TO 2 SPRAYS IN EACH NOSTRIL 1 TO 2 TIMES PER DAY   Multiple Vitamin (MULTIVITAMIN) tablet Take 1 tablet by mouth daily.   Omega-3 Fatty Acids (FISH OIL) 1000 MG CPDR Take 2,000 mg by mouth 2 (two) times daily.   promethazine-dextromethorphan (PROMETHAZINE-DM) 6.25-15 MG/5ML syrup Take 5 mLs by mouth 4 (four) times daily as needed for cough.   rosuvastatin (CRESTOR) 40 MG tablet Take 1 tablet (40 mg total) by mouth daily.   zinc gluconate 50 MG tablet Take 50 mg by mouth daily.   predniSONE (DELTASONE) 10 MG tablet 1 tab 3 x day for 2 days, then 1 tab 2 x day for 2 days, then 1 tab 1 x day for 3 days   No current facility-administered medications on file prior to visit.    ROS: all negative except what is noted in the HPI.   Physical Exam:  BP (!) 158/82   Pulse (!) 59   Temp 97.7 F (36.5 C)   Ht 6\' 1"  (1.854 m)   Wt 209 lb 3.2 oz (94.9 kg)   SpO2 98%   BMI 27.60 kg/m   General Appearance: NAD.  Awake, conversant and cooperative. Eyes: PERRLA, EOMs intact.  Sclera white.  Conjunctiva without erythema. Sinuses: No frontal/maxillary tenderness.  No nasal discharge. Nares patent.  ENT/Mouth: Ext aud canals clear.  Bilateral TMs w/DOL and without erythema or bulging. Hearing intact.  Posterior pharynx without swelling or exudate.  Tonsils without swelling or erythema.  Neck: Supple.  No masses, nodules or thyromegaly. Respiratory: Effort is regular with non-labored breathing. Breath sounds are equal bilaterally without rales, rhonchi, wheezing or stridor.  Cardio: RRR with no MRGs. Brisk peripheral pulses without edema.  Abdomen: Active BS in all four quadrants.  Soft and non-tender without guarding, rebound tenderness, hernias or masses. Lymphatics: Non tender without lymphadenopathy.  Musculoskeletal: Full ROM, 5/5 strength, normal ambulation.  No clubbing or cyanosis. Skin: Appropriate color for ethnicity.  Warm without rashes, lesions, ecchymosis, ulcers.  Neuro: CN II-XII grossly normal. Normal muscle tone without cerebellar symptoms and intact sensation.   Psych: AO X 3,  appropriate mood and affect, insight and judgment.     Adela Glimpse, NP 8:57 AM [facility]

## 2023-06-16 NOTE — Patient Instructions (Signed)

## 2023-07-04 ENCOUNTER — Other Ambulatory Visit: Payer: Self-pay | Admitting: Nurse Practitioner

## 2023-07-04 DIAGNOSIS — E782 Mixed hyperlipidemia: Secondary | ICD-10-CM

## 2023-07-09 ENCOUNTER — Encounter: Payer: Self-pay | Admitting: Nurse Practitioner

## 2023-07-09 ENCOUNTER — Ambulatory Visit (INDEPENDENT_AMBULATORY_CARE_PROVIDER_SITE_OTHER): Payer: BC Managed Care – PPO | Admitting: Nurse Practitioner

## 2023-07-09 VITALS — BP 144/88 | HR 64 | Temp 97.7°F | Ht 73.0 in | Wt 211.4 lb

## 2023-07-09 DIAGNOSIS — I700 Atherosclerosis of aorta: Secondary | ICD-10-CM

## 2023-07-09 DIAGNOSIS — N138 Other obstructive and reflux uropathy: Secondary | ICD-10-CM

## 2023-07-09 DIAGNOSIS — N401 Enlarged prostate with lower urinary tract symptoms: Secondary | ICD-10-CM | POA: Diagnosis not present

## 2023-07-09 DIAGNOSIS — R7309 Other abnormal glucose: Secondary | ICD-10-CM

## 2023-07-09 DIAGNOSIS — Z136 Encounter for screening for cardiovascular disorders: Secondary | ICD-10-CM

## 2023-07-09 DIAGNOSIS — E663 Overweight: Secondary | ICD-10-CM

## 2023-07-09 DIAGNOSIS — R350 Frequency of micturition: Secondary | ICD-10-CM | POA: Diagnosis not present

## 2023-07-09 DIAGNOSIS — Z125 Encounter for screening for malignant neoplasm of prostate: Secondary | ICD-10-CM

## 2023-07-09 DIAGNOSIS — Z13220 Encounter for screening for lipoid disorders: Secondary | ICD-10-CM | POA: Diagnosis not present

## 2023-07-09 DIAGNOSIS — Z8249 Family history of ischemic heart disease and other diseases of the circulatory system: Secondary | ICD-10-CM

## 2023-07-09 DIAGNOSIS — Z1389 Encounter for screening for other disorder: Secondary | ICD-10-CM | POA: Diagnosis not present

## 2023-07-09 DIAGNOSIS — Z79899 Other long term (current) drug therapy: Secondary | ICD-10-CM | POA: Diagnosis not present

## 2023-07-09 DIAGNOSIS — I83893 Varicose veins of bilateral lower extremities with other complications: Secondary | ICD-10-CM

## 2023-07-09 DIAGNOSIS — K5730 Diverticulosis of large intestine without perforation or abscess without bleeding: Secondary | ICD-10-CM

## 2023-07-09 DIAGNOSIS — Z86010 Personal history of colonic polyps: Secondary | ICD-10-CM

## 2023-07-09 DIAGNOSIS — Z0001 Encounter for general adult medical examination with abnormal findings: Secondary | ICD-10-CM

## 2023-07-09 DIAGNOSIS — K760 Fatty (change of) liver, not elsewhere classified: Secondary | ICD-10-CM

## 2023-07-09 DIAGNOSIS — K439 Ventral hernia without obstruction or gangrene: Secondary | ICD-10-CM

## 2023-07-09 DIAGNOSIS — Z0000 Encounter for general adult medical examination without abnormal findings: Secondary | ICD-10-CM

## 2023-07-09 DIAGNOSIS — G4733 Obstructive sleep apnea (adult) (pediatric): Secondary | ICD-10-CM

## 2023-07-09 DIAGNOSIS — B351 Tinea unguium: Secondary | ICD-10-CM

## 2023-07-09 DIAGNOSIS — I10 Essential (primary) hypertension: Secondary | ICD-10-CM

## 2023-07-09 DIAGNOSIS — Z131 Encounter for screening for diabetes mellitus: Secondary | ICD-10-CM

## 2023-07-09 DIAGNOSIS — Z860101 Personal history of adenomatous and serrated colon polyps: Secondary | ICD-10-CM

## 2023-07-09 DIAGNOSIS — E782 Mixed hyperlipidemia: Secondary | ICD-10-CM

## 2023-07-09 DIAGNOSIS — E559 Vitamin D deficiency, unspecified: Secondary | ICD-10-CM | POA: Diagnosis not present

## 2023-07-09 NOTE — Progress Notes (Signed)
Complete Physical  Assessment and Plan:  Encounter for general adult medical examination with abnormal findings Due annually  Essential hypertension Controlled Continue bASA, bisoprolol-hctz,  Discussed DASH (Dietary Approaches to Stop Hypertension) DASH diet is lower in sodium than a typical American diet. Cut back on foods that are high in saturated fat, cholesterol, and trans fats. Eat more whole-grain foods, fish, poultry, and nuts Remain active and exercise as tolerated daily.  Monitor BP at home-Call if greater than 130/80.  Check CMP/CBC  Varicose veins of lower extremities with complications, bilateral/chronic venous insufficieny Ablation 04/2022 Continue bASA Vascular Surgery following, Dr. Edilia Bo, PRN  Aortic atherosclerosis [facility])- CT 06/27/2021 Continue Fenofibrate, Omega 3, Rosuvastatin Continue to monitor Check lipids  OSA on CPAP Continue CPAP.  Refer back to Sleep Study  Hepatic steatosis - CT 06/27/2021 Resolved for now.  Continue to monitor Check LFTs  Colon, diverticulosis/History of adenomatous polyp of colon Continue monitoring dietary intake of food triggers. High fiber diet. Stay well hydrated. Continue colonoscopy screening due 2027 May need to move up if symptoms fail to improve.  BPH with obstruction/lower urinary tract symptoms Controlled. Continue Proscar  Continue to monitor Check PSA   Hyperlipidemia, mixed Continue fenofibrate, rosuvastatin Discussed lifestyle modifications. Recommended diet heavy in fruits and veggies, omega 3's. Decrease consumption of animal meats, cheeses, and dairy products. Remain active and exercise as tolerated. Continue to monitor. Check lipids/TSH  Vitamin D deficiency At goal. Continue Vitamin D Supplement Check Vitamin D levels.  Abnormal glucose (prediabetes) Education: Reviewed 'ABCs' of diabetes management  Discussed goals to be met and/or maintained include A1C (<7) Blood pressure  (<130/80) Cholesterol (LDL <70) Continue Eye Exam yearly  Continue Dental Exam Q6 mo Discussed dietary recommendations Discussed Physical Activity recommendations Check A1C  Medication management All medications discussed and reviewed in full. All questions and concerns regarding medications addressed.    Overweight (BMI 25.0-29.9) Continue lifestyle modifications. Focus on consuming a diet heavy in fruits and veggies, omega 3's. Decrease consumption of animal meats, cheeses, and dairy products. Remain active and incorporate daily tolerated exercise. Continue to monitor weight loss.  Family history of abdominal aortic aneurysm (AAA) Abdominal US  Continue to monitor  Onychomycosis Completed Terbinafine Mild improvement Refer to podiatry for further review and evaluation.  Screening for proteinuria or hematuria Check and monitor urine  Screening for prostate cancer  Check and monitor PSA  Screening for cardiovascular condition Check and monitor EKG and aorta scan  Ventral hernia No symptoms Continue to monitor  Orders Placed This Encounter  Procedures   CBC with Differential/Platelet   COMPLETE METABOLIC PANEL WITH GFR   Magnesium   Lipid panel   TSH   Hemoglobin A1c   Insulin, random   VITAMIN D 25 Hydroxy (Vit-D Deficiency, Fractures)   Urinalysis, Routine w reflex microscopic   Microalbumin / creatinine urine ratio   PSA   [place], retroperitnl abd,  ltd   EKG 12-Lead   Discussed med's effects and SE's. Screening labs and tests as requested with regular follow-up as recommended.  Over 40 minutes of exam, counseling, chart review, and complex, high level critical decision making was performed this visit.   Future Appointments  Date Time Provider Department Center  02/12/2024  9:00 AM Shawnie Dapper, NP [facility] None  07/08/2024  9:00 AM Ohanna Gassert, Archie Patten, NP [facility] None    HPI  62 y.o. male  presents for a complete physical. He has Essential hypertension;  Hyperlipidemia, mixed; Vitamin D deficiency; OSA on CPAP; Abnormal glucose (prediabetes);  Medication management; Overweight (BMI 25.0-29.9); BPH with obstruction/lower urinary tract symptoms; History of adenomatous polyp of colon; Varicose veins of lower extremities with complications, bilateral; Family history of abdominal aortic aneurysm (AAA); Aortic atherosclerosis (HCC)- CT 06/27/2021; Hepatic steatosis - CT 06/27/2021; and Colon, diverticulosis on their problem list.  Overall he reports doing well.  He had Covid 05/29/23 and was seen in office 06/09/23 for lingering symptoms which have now resolved.  Does endorse mild PND.    He reported having a week of dark tarry stools that have now resolved.  Denies constipation, N/V, hemorrhoids, acid reflux, abdominal pain.  Reports father had a hx of colon cancer.  He is UTD on colonoscopy due 2027.  X1 polyp.    Has has of OSA with original sleep study being in [place], 2016.  He has noticed increase in daytime sleepiness.  Has not had his machine, or equipment checked since 2016.   Recently followed up with Vascular on 04/2022 and completed a successful  ablation on RLE.  He has had a successful ablation of the LLE in the past.     He has been taking Terbinafine for tmt of Onychomycosis.  Not much improvement since completing course. Most notable 2nd digit right toe with is hard and thick and eyllow.  He did have a decline in kidney fx last check 02/2022 while on the medication.  Liver enzymes have remained WNL.   BMI is Body mass index is 27.89 kg/m., he has been working on diet and exercise. Wt Readings from Last 3 Encounters:  07/09/23 211 lb 6.4 oz (95.9 kg)  06/09/23 209 lb 3.2 oz (94.9 kg)  05/29/23 208 lb 3.2 oz (94.4 kg)   His blood pressure has been controlled at home, today their BP is BP: (!) 144/88  He does workout. He denies chest pain, shortness of breath, dizziness.   He is concerned for large ventral hernia.  Denies pain, N/V,  difficulty swallowing.  Has not had worked up or evaluated in the past.   He is on cholesterol medication and denies myalgias. Reports family hx of AAA, in father, Has never been a smoker.  His cholesterol  is at goal. The cholesterol last visit was:   Lab Results  Component Value Date   CHOL 155 01/16/2023   HDL 50 01/16/2023   LDLCALC 80 01/16/2023   TRIG 157 (H) 01/16/2023   CHOLHDL 3.1 01/16/2023    He has been working on diet and exercise for is not, he is on bASA, he is on ACE/ARB and denies polydipsia and polyuria. Last A1C in the office was:  Lab Results  Component Value Date   HGBA1C 5.9 (H) 01/16/2023   Last GFR: Lab Results  Component Value Date   EGFR 58 (L) 01/16/2023   Patient is on Vitamin D supplement.   Lab Results  Component Value Date   VD25OH 1 01/16/2023     Last PSA was: Lab Results  Component Value Date   PSA 0.32 07/08/2022     Current Medications:  Current Outpatient Medications on File Prior to Visit  Medication Sig Dispense Refill   aspirin EC 81 MG tablet Take 81 mg by mouth daily. Swallow whole.     bisoprolol-hydrochlorothiazide (ZIAC) 5-6.25 MG tablet Take  1 tablet  Daily for BP                                            /  TAKE 1 TABLET BY MOUTH 90 tablet 3   Cholecalciferol (VITAMIN D-3 PO) Take 10,000 Units by mouth daily.      Cinnamon 500 MG capsule Take 1,000 mg by mouth 2 (two) times daily.     fenofibrate (TRICOR) 145 MG tablet TAKE 1 TABLET BY MOUTH DAILY FOR TRIGLYCERIDES 90 tablet 2   finasteride (PROSCAR) 5 MG tablet Take 5 mg by mouth daily.     fluticasone (FLONASE) 50 MCG/ACT nasal spray SHAKE LIQUID AND USE 1 TO 2 SPRAYS IN EACH NOSTRIL 1 TO 2 TIMES PER DAY 48 g 3   Multiple Vitamin (MULTIVITAMIN) tablet Take 1 tablet by mouth daily.     Omega-3 Fatty Acids (FISH OIL) 1000 MG CPDR Take 2,000 mg by mouth 2 (two) times daily.     rosuvastatin (CRESTOR) 40 MG tablet Take 1 tablet (40 mg total) by mouth  daily. 90 tablet 3   benzonatate (TESSALON) 200 MG capsule Take 1 perle 3 x / day to prevent cough 30 capsule 1   predniSONE (DELTASONE) 10 MG tablet 1 tab 3 x day for 2 days, then 1 tab 2 x day for 2 days, then 1 tab 1 x day for 3 days 13 tablet 0   promethazine-dextromethorphan (PROMETHAZINE-DM) 6.25-15 MG/5ML syrup Take 5 mLs by mouth 4 (four) times daily as needed for cough. 240 mL 0   zinc gluconate 50 MG tablet Take 50 mg by mouth daily.     No current facility-administered medications on file prior to visit.   Allergies:  No Known Allergies Medical History:  He has Essential hypertension; Hyperlipidemia, mixed; Vitamin D deficiency; OSA on CPAP; Abnormal glucose (prediabetes); Medication management; Overweight (BMI 25.0-29.9); BPH with obstruction/lower urinary tract symptoms; History of adenomatous polyp of colon; Varicose veins of lower extremities with complications, bilateral; Family history of abdominal aortic aneurysm (AAA); Aortic atherosclerosis (HCC)- CT 06/27/2021; Hepatic steatosis - CT 06/27/2021; and Colon, diverticulosis on their problem list.  Health Maintenance:   Immunization History  Administered Date(s) Administered   Influenza Inj Mdck Quad Pf 08/10/2020   Influenza Inj Mdck Quad With Preservative 09/03/2017, 07/28/2018   Influenza Split 08/23/2014   Influenza Whole 08/09/2013   Influenza, Seasonal, Injecte, Preservative Fre 07/30/2016   Influenza-Unspecified 08/01/2019, 08/10/2020, 08/23/2021   PFIZER(Purple Top)SARS-COV-2 Vaccination 12/30/2019, 01/20/2020, 07/31/2020, 03/30/2021, 10/01/2021   PPD Test 08/23/2014, 10/18/2015, 01/29/2017, 04/09/2018, 05/10/2019, 05/17/2020   Pneumococcal Conjugate-13 08/23/2014   Tdap 01/17/2016   Zoster Recombinant(Shingrix) 09/12/2019, 11/20/2019   Health Maintenance  Topic Date Due   INFLUENZA VACCINE  05/15/2023   COVID-19 Vaccine (6 - 2023-24 season) 06/15/2023   DTaP/Tdap/Td (2 - Td or Tdap) 01/16/2026   Colonoscopy   07/28/2027   Hepatitis C Screening  Completed   Zoster Vaccines- Shingrix  Completed   HPV VACCINES  Aged Out   HIV Screening  Discontinued    Sexually Active: yes STD testing offered - defers  Colonoscopy: 07/2020 7 year recall:  Due Next due on 07/28/2027 EGD:  Last Dental Exam: 05/2023 follows every 6 mo Last Eye Exam: Yearly last 2024 Last Derm Exam: PRN  Patient Care Team: Lucky Cowboy, MD as PCP - General (Internal Medicine)  Surgical History:  He has a past surgical history that includes Wisdom tooth extraction (1976); Nasal septum surgery; Incision and drainage perirectal abscess; Melanoma excision (2009); Hernia repair (Left, groin); Hernia repair (Right, groin); Shoulder arthroscopy (Left, 2003); melanona (2009); Colonoscopy (04/13/2010); Tissue graft; Cystoscopy/ureteroscopy/holmium laser/stent placement (Left, 07/13/2021); Transurethral resection of prostate (07/13/2021); Endovenous  ablation saphenous vein w/ laser (Left, 08/08/2021); Incision and drainage perirectal abscess (1998); and Cystoscopy/ureteroscopy/holmium laser/stent placement (Left, 10/18/2022). Family History:  Hisfamily history includes AAA (abdominal aortic aneurysm) in his father and paternal aunt; Aneurysm (age of onset: 8) in his mother; CAD in his father and paternal aunt; Heart attack in his paternal uncle; Kidney disease in his father; Kidney failure in his father; Prostate cancer in his father. Social History:  He reports that he has never smoked. He has never used smokeless tobacco. He reports that he does not drink alcohol and does not use drugs.  Review of Systems: Review of Systems  Constitutional: Negative.   HENT: Negative.    Eyes: Negative.   Respiratory: Negative.    Cardiovascular: Negative.   Gastrointestinal:  Positive for blood in stool. Negative for abdominal pain, constipation, diarrhea, heartburn, nausea and vomiting.  Genitourinary: Negative.   Musculoskeletal: Negative.    Skin: Negative.   Neurological: Negative.   Psychiatric/Behavioral: Negative.      Physical Exam: Estimated body mass index is 27.89 kg/m as calculated from the following:   Height as of this encounter: 6\' 1"  (1.854 m).   Weight as of this encounter: 211 lb 6.4 oz (95.9 kg). BP (!) 144/88   Pulse 64   Temp 97.7 F (36.5 C)   Ht 6\' 1"  (1.854 m)   Wt 211 lb 6.4 oz (95.9 kg)   SpO2 98%   BMI 27.89 kg/m   General Appearance: Well nourished, well developed, in no apparent distress.  Eyes: PERRLA, EOMs, conjunctiva no swelling or erythema, normal fundi and vessels.  Sinuses: No Frontal/maxillary tenderness  ENT/Mouth: Ext aud canals clear, normal light reflex with TMs without erythema, bulging. Good dentition. No erythema, swelling, or exudate on post pharynx. Tonsils not swollen or erythematous. Hearing normal.  Neck: Supple, thyroid normal. No bruits  Respiratory: Respiratory effort normal, BS equal bilaterally without rales, rhonchi, wheezing or stridor.  Cardio: RRR without murmurs, rubs or gallops. Brisk peripheral pulses without edema.  Chest: symmetric, with normal excursions and percussion.  Abdomen: Soft, nontender, no guarding, rebound, hernias, masses, or organomegaly.  Lymphatics: Non tender without lymphadenopathy.  Musculoskeletal: Full ROM all peripheral extremities,5/5 strength, and normal gait.  Skin: Warm, dry without rashes, lesions, ecchymosis. Neuro: Cranial nerves intact, reflexes equal bilaterally. Normal muscle tone, no cerebellar symptoms. Sensation intact.  Psych: Awake and oriented X 3, normal affect, Insight and Judgment appropriate.  Genitourinary: Defer to Urology  EKG: WNL no ST changes. AORTA SCAN: WNL   Adela Glimpse, NP 9:45 AM [facility]

## 2023-07-09 NOTE — Patient Instructions (Signed)

## 2023-07-10 LAB — CBC WITH DIFFERENTIAL/PLATELET
Absolute Monocytes: 645 cells/uL (ref 200–950)
Basophils Absolute: 68 cells/uL (ref 0–200)
Basophils Relative: 1.1 %
Eosinophils Absolute: 99 cells/uL (ref 15–500)
Eosinophils Relative: 1.6 %
HCT: 44.3 % (ref 38.5–50.0)
Hemoglobin: 14.5 g/dL (ref 13.2–17.1)
Lymphs Abs: 1321 cells/uL (ref 850–3900)
MCH: 30.1 pg (ref 27.0–33.0)
MCHC: 32.7 g/dL (ref 32.0–36.0)
MCV: 91.9 fL (ref 80.0–100.0)
MPV: 9.7 fL (ref 7.5–12.5)
Monocytes Relative: 10.4 %
Neutro Abs: 4067 cells/uL (ref 1500–7800)
Neutrophils Relative %: 65.6 %
Platelets: 340 10*3/uL (ref 140–400)
RBC: 4.82 10*6/uL (ref 4.20–5.80)
RDW: 12.2 % (ref 11.0–15.0)
Total Lymphocyte: 21.3 %
WBC: 6.2 10*3/uL (ref 3.8–10.8)

## 2023-07-10 LAB — VITAMIN D 25 HYDROXY (VIT D DEFICIENCY, FRACTURES): Vit D, 25-Hydroxy: 70 ng/mL (ref 30–100)

## 2023-07-10 LAB — URINALYSIS, ROUTINE W REFLEX MICROSCOPIC
Bilirubin Urine: NEGATIVE
Glucose, UA: NEGATIVE
Hgb urine dipstick: NEGATIVE
Ketones, ur: NEGATIVE
Leukocytes,Ua: NEGATIVE
Nitrite: NEGATIVE
Protein, ur: NEGATIVE
Specific Gravity, Urine: 1.021 (ref 1.001–1.035)
pH: <=5 (ref 5.0–8.0)

## 2023-07-10 LAB — HEMOGLOBIN A1C
Hgb A1c MFr Bld: 6.1 % of total Hgb — ABNORMAL HIGH (ref <5.7)
Mean Plasma Glucose: 128 mg/dL
eAG (mmol/L): 7.1 mmol/L

## 2023-07-10 LAB — MAGNESIUM: Magnesium: 2.1 mg/dL (ref 1.5–2.5)

## 2023-07-10 LAB — COMPLETE METABOLIC PANEL WITH GFR
AG Ratio: 2 (calc) (ref 1.0–2.5)
ALT: 21 U/L (ref 9–46)
AST: 21 U/L (ref 10–35)
Albumin: 4.6 g/dL (ref 3.6–5.1)
Alkaline phosphatase (APISO): 43 U/L (ref 35–144)
BUN/Creatinine Ratio: 21 (calc) (ref 6–22)
BUN: 27 mg/dL — ABNORMAL HIGH (ref 7–25)
CO2: 30 mmol/L (ref 20–32)
Calcium: 10.7 mg/dL — ABNORMAL HIGH (ref 8.6–10.3)
Chloride: 101 mmol/L (ref 98–110)
Creat: 1.3 mg/dL (ref 0.70–1.35)
Globulin: 2.3 g/dL (calc) (ref 1.9–3.7)
Glucose, Bld: 99 mg/dL (ref 65–99)
Potassium: 4.9 mmol/L (ref 3.5–5.3)
Sodium: 139 mmol/L (ref 135–146)
Total Bilirubin: 0.5 mg/dL (ref 0.2–1.2)
Total Protein: 6.9 g/dL (ref 6.1–8.1)
eGFR: 61 mL/min/{1.73_m2} (ref >=60)

## 2023-07-10 LAB — MICROALBUMIN / CREATININE URINE RATIO
Creatinine, Urine: 155 mg/dL (ref 20–320)
Microalb Creat Ratio: 13 mg/g creat (ref <30)
Microalb, Ur: 2 mg/dL

## 2023-07-10 LAB — PSA: PSA: 0.29 ng/mL (ref <=4.00)

## 2023-07-10 LAB — LIPID PANEL
Cholesterol: 180 mg/dL (ref <200)
HDL: 54 mg/dL (ref >=40)
LDL Cholesterol (Calc): 96 mg/dL (calc)
Non-HDL Cholesterol (Calc): 126 mg/dL (calc) (ref <130)
Total CHOL/HDL Ratio: 3.3 (calc) (ref <5.0)
Triglycerides: 200 mg/dL — ABNORMAL HIGH (ref <150)

## 2023-07-10 LAB — TSH: TSH: 2.48 mIU/L (ref 0.40–4.50)

## 2023-07-10 LAB — INSULIN, RANDOM: Insulin: 20.6 u[IU]/mL — ABNORMAL HIGH

## 2023-08-01 ENCOUNTER — Ambulatory Visit (INDEPENDENT_AMBULATORY_CARE_PROVIDER_SITE_OTHER): Payer: BC Managed Care – PPO | Admitting: Vascular Surgery

## 2023-08-01 ENCOUNTER — Encounter: Payer: Self-pay | Admitting: Vascular Surgery

## 2023-08-01 VITALS — BP 152/89 | HR 70 | Temp 98.2°F | Resp 20 | Ht 73.0 in | Wt 216.0 lb

## 2023-08-01 DIAGNOSIS — I872 Venous insufficiency (chronic) (peripheral): Secondary | ICD-10-CM

## 2023-08-01 NOTE — Progress Notes (Signed)
Patient ID: Tony Bailey, male   DOB: October 06, 1961, 62 y.o.   MRN: [id number]  Reason for Consult: Follow-up   Referred by Lucky Cowboy, MD  Subjective:     HPI  Tony Bailey is a 62 y.o. male underwent bilateral GSV ablations and stab phlebectomies with Dr. Durwin Nora.  Most recently done on the left leg in July 2023.  He presents to clinic today when he noticed this.  Bump behind his left knee and something that appeared like a stitch extruding from the skin.  He reports overall he has been doing well and is pleased with his results.  There has been a small varicosity that is popped up on his anterior shin on the left but overall asymptomatic and continues to be compliant with compression and elevation  Past Medical History:  Diagnosis Date   Anesthesia complication    Pt stated that years ago when he was having surgery for a perirectal abcess his heart stopped. He stated that he was told it was due to the position he was in and the anesthesia going to his heart. He said they were able to get him back quickly. Since then he has had surgeries with no complications.   Anxiety    Bleeding from varicose vein    History of kidney stones    Hyperlipidemia    Hypertension    Melanoma (HCC) 2009   upper back mid right side   OSA (obstructive sleep apnea)    Other testicular hypofunction    Pre-diabetes    Prediabetes 05/09/2019   Sciatic nerve pain 2010   hx - no current problems per patient 07/06/20   Sleep apnea    uses cpap nightly   Testosterone deficiency 10/13/2013   Unspecified vitamin D deficiency    Venous insufficiency    bilateral   Family History  Problem Relation Age of Onset   Aneurysm Mother 33   AAA (abdominal aortic aneurysm) Father    Kidney disease Father    Kidney failure Father    Prostate cancer Father    CAD Father    AAA (abdominal aortic aneurysm) Paternal Aunt    CAD Paternal Aunt    Heart attack Paternal Uncle        Several paternal uncles with  CAD hx   Colon cancer Neg Hx    Stomach cancer Neg Hx    Rectal cancer Neg Hx    Past Surgical History:  Procedure Laterality Date   COLONOSCOPY  04/13/2010   [facility]    CYSTOSCOPY/URETEROSCOPY/HOLMIUM LASER/STENT PLACEMENT Left 07/13/2021   Procedure: CYSTOSCOPY BILATERAL RETROGRADE PYELOGRAM/CYSTOLITHALOPAXY;  Surgeon: Sebastian Ache, MD;  Location: [facility] [place];  Service: Urology;  Laterality: Left;   CYSTOSCOPY/URETEROSCOPY/HOLMIUM LASER/STENT PLACEMENT Left 10/18/2022   Procedure: CYSTOSCOPY LEFT RETROGRADE PYELOGRAM URETEROSCOPY/HOLMIUM LASER/STENT PLACEMENT;  Surgeon: Sebastian Ache, MD;  Location: [facility];  Service: Urology;  Laterality: Left;   ENDOVENOUS ABLATION SAPHENOUS VEIN W/ LASER Left 08/08/2021   endovenous laser ablation left greater saphenous veins and stab phlebectomy > 20 incisions left leg by Cari Caraway MD   HERNIA REPAIR Left groin   1970   HERNIA REPAIR Right groin   2011   INCISION AND DRAINAGE PERIRECTAL ABSCESS     x 2   INCISION AND DRAINAGE PERIRECTAL ABSCESS  1998   MELANOMA EXCISION  2009   melanona  2009   upper back -mid right side   NASAL SEPTUM SURGERY     SHOULDER ARTHROSCOPY Left  2003   TISSUE GRAFT     hx of gum grafts   TRANSURETHRAL RESECTION OF PROSTATE  07/13/2021   Procedure: TRANSURETHRAL RESECTION OF THE PROSTATE (TURP);  Surgeon: Sebastian Ache, MD;  Location: [facility];  Service: Urology;;   WISDOM TOOTH EXTRACTION  1976    Short Social History:  Social History   Tobacco Use   Smoking status: Never   Smokeless tobacco: Never  Substance Use Topics   Alcohol use: No    No Known Allergies  Current Outpatient Medications  Medication Sig Dispense Refill   aspirin EC 81 MG tablet Take 81 mg by mouth daily. Swallow whole.     bisoprolol-hydrochlorothiazide (ZIAC) 5-6.25 MG tablet Take  1 tablet  Daily for BP                                            /                    TAKE 1 TABLET BY  MOUTH 90 tablet 3   Cholecalciferol (VITAMIN D-3 PO) Take 10,000 Units by mouth daily.      Cinnamon 500 MG capsule Take 1,000 mg by mouth 2 (two) times daily.     fenofibrate (TRICOR) 145 MG tablet TAKE 1 TABLET BY MOUTH DAILY FOR TRIGLYCERIDES 90 tablet 2   finasteride (PROSCAR) 5 MG tablet Take 5 mg by mouth daily.     fluticasone (FLONASE) 50 MCG/ACT nasal spray SHAKE LIQUID AND USE 1 TO 2 SPRAYS IN EACH NOSTRIL 1 TO 2 TIMES PER DAY 48 g 3   Multiple Vitamin (MULTIVITAMIN) tablet Take 1 tablet by mouth daily.     Omega-3 Fatty Acids (FISH OIL) 1000 MG CPDR Take 2,000 mg by mouth 2 (two) times daily.     rosuvastatin (CRESTOR) 40 MG tablet Take 1 tablet (40 mg total) by mouth daily. 90 tablet 3   zinc gluconate 50 MG tablet Take 50 mg by mouth daily.     No current facility-administered medications for this visit.    REVIEW OF SYSTEMS  Negative other than noted in HPI     Objective:  Objective   Vitals:   08/01/23 1528  BP: (!) 152/89  Pulse: 70  Resp: 20  Temp: 98.2 F (36.8 C)  SpO2: 94%  Weight: 216 lb (98 kg)  Height: 6\' 1"  (1.854 m)   Body mass index is 28.5 kg/m.  Physical Exam General: no acute distress Cardiac: hemodynamically stable, nontachycardic Extremities: Small bump of left popliteal fossa appears to be either a calcium deposit or maybe an old splinter     Assessment/Plan:     Tony Bailey is a 62 y.o. male presenting for a bump behind his knee with what he thought was suture extruding from the skin. I performed a small bedside debridement and noticed that the material crumbled and appeared to be calcific. I explained that this could be a calcium deposit or old splinter that is working its way out.  There was no suture and I explained this is dangerous and should resolve with time.  Overall he is pleased with his results from bilateral GSV ablation and stab phlebectomy Follow-up as needed     Daria Pastures MD [facility]

## 2023-08-13 ENCOUNTER — Ambulatory Visit (INDEPENDENT_AMBULATORY_CARE_PROVIDER_SITE_OTHER): Payer: BC Managed Care – PPO | Admitting: Nurse Practitioner

## 2023-08-13 ENCOUNTER — Encounter: Payer: Self-pay | Admitting: Nurse Practitioner

## 2023-08-13 VITALS — BP 150/90 | HR 58 | Temp 97.9°F | Ht 73.0 in | Wt 214.6 lb

## 2023-08-13 DIAGNOSIS — R5081 Fever presenting with conditions classified elsewhere: Secondary | ICD-10-CM | POA: Diagnosis not present

## 2023-08-13 DIAGNOSIS — K5730 Diverticulosis of large intestine without perforation or abscess without bleeding: Secondary | ICD-10-CM

## 2023-08-13 DIAGNOSIS — K5792 Diverticulitis of intestine, part unspecified, without perforation or abscess without bleeding: Secondary | ICD-10-CM

## 2023-08-13 DIAGNOSIS — I10 Essential (primary) hypertension: Secondary | ICD-10-CM

## 2023-08-13 MED ORDER — METRONIDAZOLE 500 MG PO TABS: ORAL_TABLET | ORAL | 0 refills | Status: DC

## 2023-08-13 MED ORDER — CIPROFLOXACIN HCL 750 MG PO TABS
750.0000 mg | ORAL_TABLET | Freq: Two times a day (BID) | ORAL | 0 refills | Status: DC
Start: 2023-08-13 — End: 2024-01-20

## 2023-08-13 MED ORDER — PANTOPRAZOLE SODIUM 40 MG PO TBEC
40.0000 mg | DELAYED_RELEASE_TABLET | Freq: Every day | ORAL | 0 refills | Status: DC
Start: 2023-08-13 — End: 2024-01-20

## 2023-08-13 NOTE — Patient Instructions (Signed)
Diverticulitis  Diverticulitis is when small pouches in your colon get infected or swollen. This causes pain in your belly (abdomen) and watery poop (diarrhea). The small pouches are called diverticula. They may form if you have a condition called diverticulosis. What are the causes? You may get this condition if poop (stool) gets trapped in the pouches in your colon. The poop lets germs (bacteria) grow. This causes an infection. What increases the risk? You are more likely to get this condition if you have small pouches in your colon. You are also more likely to get it if: You are overweight or very overweight (obese). You do not exercise enough. You drink alcohol. You smoke. You eat a lot of red meat, like beef, pork, or lamb. You do not eat enough fiber. You are older than 64 years of age. What are the signs or symptoms? Pain in your belly. Pain is often on the left side, but it may be felt in other spots too. Fever and chills. Feeling like you may vomit. Vomiting. Having cramps. Feeling full. Changes in how often you poop. Blood in your poop. How is this treated? Most cases are treated at home. You may be told to: Take over-the-counter pain medicines. Only eat and drink clear liquids. Take antibiotics. Rest. Very bad cases may need to be treated at a hospital. Treatment may include: Not eating or drinking. Taking pain medicines. Getting antibiotics through an IV tube. Getting fluid and food through an IV tube. Having surgery. When you are feeling better, you may need to have a test to look at your colon (colonoscopy). Follow these instructions at home: Medicines Take over-the-counter and prescription medicines only as told by your doctor. These include: Fiber pills. Probiotics. Medicines to make your poop soft (stool softeners). If you were prescribed antibiotics, take them as told by your doctor. Do not stop taking them even if you start to feel better. Ask your  doctor if you should avoid driving or using machines while you are taking your medicine. Eating and drinking  Follow the diet told by your doctor. You may need to only eat and drink liquids. When you feel better, you may be able to eat more foods. You may also be told to eat a lot of fiber. Fiber helps you poop. Foods with fiber include berries, beans, lentils, and green vegetables. Try not to eat red meat. General instructions Do not smoke or use any products that contain nicotine or tobacco. If you need help quitting, ask your doctor. Exercise 3 or more times a week. Try to go for 30 minutes each time. Exercise enough to sweat and make your heart beat faster. Contact a doctor if: Your pain gets worse. You are not pooping like normal. Your symptoms do not get better. Your symptoms get worse very fast. You have a fever. You vomit more than one time. You have poop that is: Bloody. Black. Tarry. This information is not intended to replace advice given to you by your health care provider. Make sure you discuss any questions you have with your health care provider. Document Revised: 06/27/2022 Document Reviewed: 06/27/2022 Elsevier Patient Education  2024 [employer].

## 2023-08-13 NOTE — Progress Notes (Signed)
Assessment and Plan:  Tony Bailey was seen today for an episodic visit.  Diagnoses and all order for this visit:  Colon, diverticulosis with diverticulitis flare Start tmt with antibiotic regimen and PPI Clear liquid diet or soft bland foods and avoid high fiber - advance diet as tolerated Stay well hydrated. Contact office or report to ER if pain worsens, you are unable to have a BM, you noticed blood in the stool or fever is uncontrolled.  - ciprofloxacin (CIPRO) 750 MG tablet; Take 1 tablet (750 mg total) by mouth 2 (two) times daily.  Dispense: 14 tablet; Refill: 0 - metroNIDAZOLE (FLAGYL) 500 MG tablet; Take 1 tablet 3 x /day with Meals for Diverticulitis  Dispense: 42 tablet; Refill: 0 - pantoprazole (PROTONIX) 40 MG tablet; Take 1 tablet (40 mg total) by mouth daily.  Dispense: 14 tablet; Refill: 0  Fever May take OTC Tylenol as needed as directed Continue to monitor or increase in fiver and contact office or report to ER if uncontrolled.  Essential hypertension Elevated in clinic secondary to flare. Discussed DASH (Dietary Approaches to Stop Hypertension) DASH diet is lower in sodium than a typical American diet. Cut back on foods that are high in saturated fat, cholesterol, and trans fats. Eat more whole-grain foods, fish, poultry, and nuts Remain active and exercise as tolerated daily.  Monitor BP at home-Call if greater than 130/80.   Notify office for further evaluation and treatment, questions or concerns if s/s fail to improve. The risks and benefits of my recommendations, as well as other treatment options were discussed with the patient today. Questions were answered.  Further disposition pending results of labs. Discussed med's effects and SE's.    Over 20 minutes of exam, counseling, chart review, and critical decision making was performed.   Future Appointments  Date Time Provider Department Center  10/14/2023 10:45 AM Adela Glimpse, NP [facility] None   02/12/2024  9:00 AM Lomax, Amy, NP [facility] None  07/08/2024  9:00 AM Merlin Ege, Archie Patten, NP [facility] None    ------------------------------------------------------------------------------------------------------------------   HPI BP (!) 150/90   Pulse (!) 58   Temp 97.9 F (36.6 C)   Ht 6\' 1"  (1.854 m)   Wt 214 lb 9.6 oz (97.3 kg)   SpO2 98%   BMI 28.31 kg/m    Patient complains of chills, fever, and LLQ abdominal pain. The pain is described as colicky, dull, and sharp, and is a 6/10 in intensity. Onset was 3 days ago. Symptoms have been gradually worsening since that time. Associated symptoms: none. Aggravating factors: eating and pressure. Alleviating factors: acetaminophen.  He does not usually eat red meat but celebrated his wedding anniversary last week and ate a [place] steak at [employer] which he felt triggered the attack.  He denies N/V, melena, changes to stool.  Fever has been low grade.  Elevated BP in clinic today.  He is asymptomatic.    Past Medical History:  Diagnosis Date   Anesthesia complication    Pt stated that years ago when he was having surgery for a perirectal abcess his heart stopped. He stated that he was told it was due to the position he was in and the anesthesia going to his heart. He said they were able to get him back quickly. Since then he has had surgeries with no complications.   Anxiety    Bleeding from varicose vein    History of kidney stones    Hyperlipidemia    Hypertension    Melanoma (  HCC) 2009   upper back mid right side   OSA (obstructive sleep apnea)    Other testicular hypofunction    Pre-diabetes    Prediabetes 05/09/2019   Sciatic nerve pain 2010   hx - no current problems per patient 07/06/20   Sleep apnea    uses cpap nightly   Testosterone deficiency 10/13/2013   Unspecified vitamin D deficiency    Venous insufficiency    bilateral     No Known Allergies  Current Outpatient Medications on File Prior to Visit   Medication Sig   aspirin EC 81 MG tablet Take 81 mg by mouth daily. Swallow whole.   bisoprolol-hydrochlorothiazide (ZIAC) 5-6.25 MG tablet Take  1 tablet  Daily for BP                                            /                    TAKE 1 TABLET BY MOUTH   Cholecalciferol (VITAMIN D-3 PO) Take 10,000 Units by mouth daily.    Cinnamon 500 MG capsule Take 1,000 mg by mouth 2 (two) times daily.   fenofibrate (TRICOR) 145 MG tablet TAKE 1 TABLET BY MOUTH DAILY FOR TRIGLYCERIDES   finasteride (PROSCAR) 5 MG tablet Take 5 mg by mouth daily.   fluticasone (FLONASE) 50 MCG/ACT nasal spray SHAKE LIQUID AND USE 1 TO 2 SPRAYS IN EACH NOSTRIL 1 TO 2 TIMES PER DAY   Multiple Vitamin (MULTIVITAMIN) tablet Take 1 tablet by mouth daily.   Omega-3 Fatty Acids (FISH OIL) 1000 MG CPDR Take 2,000 mg by mouth 2 (two) times daily.   rosuvastatin (CRESTOR) 40 MG tablet Take 1 tablet (40 mg total) by mouth daily.   zinc gluconate 50 MG tablet Take 50 mg by mouth daily.   No current facility-administered medications on file prior to visit.    ROS: all negative except what is noted in the HPI.   Physical Exam:  BP (!) 150/90   Pulse (!) 58   Temp 97.9 F (36.6 C)   Ht 6\' 1"  (1.854 m)   Wt 214 lb 9.6 oz (97.3 kg)   SpO2 98%   BMI 28.31 kg/m   General Appearance: NAD.  Awake, conversant and cooperative. Eyes: PERRLA, EOMs intact.  Sclera white.  Conjunctiva without erythema. Sinuses: No frontal/maxillary tenderness.  No nasal discharge. Nares patent.  ENT/Mouth: Ext aud canals clear.  Bilateral TMs w/DOL and without erythema or bulging. Hearing intact.  Posterior pharynx without swelling or exudate.  Tonsils without swelling or erythema.  Neck: Supple.  No masses, nodules or thyromegaly. Respiratory: Effort is regular with non-labored breathing. Breath sounds are equal bilaterally without rales, rhonchi, wheezing or stridor.  Cardio: RRR with no MRGs. Brisk peripheral pulses without edema.  Abdomen:  Active BS in all four quadrants.  Tender to LLQ with palpation.  No rebound tenderness, hernias or masses. Lymphatics: Non tender without lymphadenopathy.  Musculoskeletal: Full ROM, 5/5 strength, normal ambulation.  No clubbing or cyanosis. Skin: Appropriate color for ethnicity. Warm without rashes, lesions, ecchymosis, ulcers.  Neuro: CN II-XII grossly normal. Normal muscle tone without cerebellar symptoms and intact sensation.   Psych: AO X 3,  appropriate mood and affect, insight and judgment.     Adela Glimpse, NP 12:46 PM [facility]

## 2023-09-18 NOTE — Progress Notes (Signed)
Assessment and Plan:  Tony "[name]" was seen today for uri.  Diagnoses and all orders for this visit:  Essential hypertension - continue medications: Ziac 5/6.25 mg every day  - Continue  DASH diet, exercise and monitor at home. Call if greater than 130/80.    Allergic rhinitis, unspecified seasonality, unspecified trigger Generic allegra or Zyrtec x 10-14 days Ipratropium nasal spray 2 sprays each nostril q 8 hours If no improvement or develops fever, colored mucus, productive cough notify the office -     ipratropium (ATROVENT) 0.03 % nasal spray; Place 2 sprays into the nose 3 (three) times daily.       Further disposition pending results of labs. Discussed med's effects and SE's.   Over 30 minutes of exam, counseling, chart review, and critical decision making was performed.   Future Appointments  Date Time Provider Department Center  10/14/2023 10:45 AM Adela Glimpse, NP [facility] None  02/12/2024  9:00 AM Lomax, Amy, NP [facility] None  07/08/2024  9:00 AM Cranford, Archie Patten, NP [facility] None    ------------------------------------------------------------------------------------------------------------------   HPI BP 130/80   Pulse 86   Temp 97.9 F (36.6 C)   Resp 18   Ht 6\' 1"  (1.854 m)   Wt 214 lb 12.8 oz (97.4 kg)   SpO2 96%   BMI 28.34 kg/m   62 y.o.male presents for clear nasal drainage and watery eyes which has been present for 2 days. Has also been having sneezing. He has taken some sudafed on Wednesday. Does not take allergy pills but takes Flonase at night.   BP well controlled with Ziac 5/6.25 mg every day  BP Readings from Last 3 Encounters:  09/19/23 130/80  08/13/23 (!) 150/90  08/01/23 (!) 152/89  Denies headaches, chest pain, shortness of breath and dizziness   BMI is Body mass index is 28.34 kg/m., he has been working on diet and exercise. Wt Readings from Last 3 Encounters:  09/19/23 214 lb 12.8 oz (97.4 kg)  08/13/23 214 lb 9.6 oz (97.3  kg)  08/01/23 216 lb (98 kg)    Past Medical History:  Diagnosis Date   Anesthesia complication    Pt stated that years ago when he was having surgery for a perirectal abcess his heart stopped. He stated that he was told it was due to the position he was in and the anesthesia going to his heart. He said they were able to get him back quickly. Since then he has had surgeries with no complications.   Anxiety    Bleeding from varicose vein    History of kidney stones    Hyperlipidemia    Hypertension    Melanoma (HCC) 2009   upper back mid right side   OSA (obstructive sleep apnea)    Other testicular hypofunction    Pre-diabetes    Prediabetes 05/09/2019   Sciatic nerve pain 2010   hx - no current problems per patient 07/06/20   Sleep apnea    uses cpap nightly   Testosterone deficiency 10/13/2013   Unspecified vitamin D deficiency    Venous insufficiency    bilateral     No Known Allergies  Current Outpatient Medications on File Prior to Visit  Medication Sig   aspirin EC 81 MG tablet Take 81 mg by mouth daily. Swallow whole.   bisoprolol-hydrochlorothiazide (ZIAC) 5-6.25 MG tablet Take  1 tablet  Daily for BP                                            /  TAKE 1 TABLET BY MOUTH   Cholecalciferol (VITAMIN D-3 PO) Take 10,000 Units by mouth daily.    Cinnamon 500 MG capsule Take 1,000 mg by mouth 2 (two) times daily.   fenofibrate (TRICOR) 145 MG tablet TAKE 1 TABLET BY MOUTH DAILY FOR TRIGLYCERIDES   finasteride (PROSCAR) 5 MG tablet Take 5 mg by mouth daily.   fluticasone (FLONASE) 50 MCG/ACT nasal spray SHAKE LIQUID AND USE 1 TO 2 SPRAYS IN EACH NOSTRIL 1 TO 2 TIMES PER DAY   metroNIDAZOLE (FLAGYL) 500 MG tablet Take 1 tablet 3 x /day with Meals for Diverticulitis   Multiple Vitamin (MULTIVITAMIN) tablet Take 1 tablet by mouth daily.   Omega-3 Fatty Acids (FISH OIL) 1000 MG CPDR Take 2,000 mg by mouth 2 (two) times daily.   pantoprazole (PROTONIX) 40 MG  tablet Take 1 tablet (40 mg total) by mouth daily.   rosuvastatin (CRESTOR) 40 MG tablet Take 1 tablet (40 mg total) by mouth daily.   zinc gluconate 50 MG tablet Take 50 mg by mouth daily.   ciprofloxacin (CIPRO) 750 MG tablet Take 1 tablet (750 mg total) by mouth 2 (two) times daily. (Patient not taking: Reported on 09/19/2023)   No current facility-administered medications on file prior to visit.    ROS: all negative except above.   Physical Exam:  BP 130/80   Pulse 86   Temp 97.9 F (36.6 C)   Resp 18   Ht 6\' 1"  (1.854 m)   Wt 214 lb 12.8 oz (97.4 kg)   SpO2 96%   BMI 28.34 kg/m   General Appearance: Well nourished, in no apparent distress. Eyes: PERRLA, EOMs, conjunctiva no swelling or erythema Sinuses: No Frontal/maxillary tenderness ENT/Mouth: Ext aud canals clear, TMs without erythema, bulging. No erythema, swelling, or exudate on post pharynx.  Pale nasal mucosa. Hearing normal.  Neck: Supple, thyroid normal.  Respiratory: Respiratory effort normal, BS equal bilaterally without rales, rhonchi, wheezing or stridor.  Cardio: RRR with no MRGs. Brisk peripheral pulses without edema.  Abdomen: Soft, + BS.  Lymphatics: Non tender without lymphadenopathy.  Musculoskeletal: Full ROM, 5/5 strength, normal gait.  Skin: Warm, dry without rashes, lesions, ecchymosis.  Neuro: Cranial nerves intact. Normal muscle tone, no cerebellar symptoms. Sensation intact.  Psych: Awake and oriented X 3, normal affect, Insight and Judgment appropriate.     Raynelle Dick, NP 9:45 AM [facility]

## 2023-09-19 ENCOUNTER — Ambulatory Visit: Payer: BC Managed Care – PPO | Admitting: Nurse Practitioner

## 2023-09-19 ENCOUNTER — Encounter: Payer: Self-pay | Admitting: Nurse Practitioner

## 2023-09-19 ENCOUNTER — Other Ambulatory Visit: Payer: Self-pay

## 2023-09-19 VITALS — BP 130/80 | HR 86 | Temp 97.9°F | Resp 18 | Ht 73.0 in | Wt 214.8 lb

## 2023-09-19 DIAGNOSIS — I10 Essential (primary) hypertension: Secondary | ICD-10-CM | POA: Diagnosis not present

## 2023-09-19 DIAGNOSIS — J309 Allergic rhinitis, unspecified: Secondary | ICD-10-CM | POA: Diagnosis not present

## 2023-09-19 MED ORDER — IPRATROPIUM BROMIDE 0.03 % NA SOLN
2.0000 | Freq: Three times a day (TID) | NASAL | 2 refills | Status: DC
Start: 2023-09-19 — End: 2024-01-20

## 2023-09-19 NOTE — Patient Instructions (Signed)
Allergic Rhinitis, Adult  Allergic rhinitis is a reaction to allergens. Allergens are things that can cause an allergic reaction. This condition affects the lining inside the nose (mucous membrane). There are two types of allergic rhinitis: Seasonal. This type is also called hay fever. It happens only during some times of the year. Perennial. This type can happen at any time of the year. This condition cannot be spread from person to person (is not contagious). It can be mild, bad, or very bad. It can develop at any age and may be outgrown. What are the causes? Pollen from grasses, trees, and weeds. Other causes can be: Dust mites. Smoke. Mold. Car fumes. The pee (urine), spit, or dander of pets. Dander is dead skin cells from a pet. What increases the risk? You are more likely to develop this condition if: You have allergies in your family. You have problems like allergies in your family. You may have: Swelling of parts of your eyes and eyelids. Asthma. This affects how you breathe. Long-term redness and swelling on your skin. Food allergies. What are the signs or symptoms? The main symptom of this condition is a runny or stuffy nose (nasal congestion). Other symptoms may include: Sneezing or coughing. Itching and tearing of your eyes. Mucus that drips down the back of your throat (postnasal drip). This may cause a sore throat. Trouble sleeping. Feeling tired. Headache. How is this treated? There is no cure for this condition. You should avoid things that you are allergic to. Treatment can help to relieve symptoms. This may include: Medicines that block allergy symptoms, such as anti-inflammatories or antihistamines. These may be given as a shot, nasal spray, or pill. Avoiding things you are allergic to. Medicines that give you some of what you are allergic to over time. This is called immunotherapy. It is done if other treatments do not help. You may get: Shots. Medicine under  your tongue. Stronger medicines, if other treatments do not help. Follow these instructions at home: Avoiding allergens Find out what things you are allergic to and avoid them. To do this, try these things: If you get allergies any time of year: Replace carpet with wood, tile, or vinyl flooring. Carpet can trap pet dander and dust. Do not smoke. Do not allow smoking in your home. Change your heating and air conditioning filters at least once a month. If you get allergies only some times of the year: Keep windows closed when you can. Plan things to do outside when pollen counts are lowest. Check pollen counts before you plan things to do outside. When you come indoors, change your clothes and shower before you sit on furniture or bedding. If you are allergic to a pet: Keep the pet out of your bedroom. Vacuum, sweep, and dust often. General instructions Take over-the-counter and prescription medicines only as told by your doctor. Drink enough fluid to keep your pee pale yellow. Where to find more information American Academy of Allergy, Asthma & Immunology: aaaai.org Contact a doctor if: You have a fever. You get a cough that does not go away. You make high-pitched whistling sounds when you breathe, most often when you breathe out (wheeze). Your symptoms slow you down. Your symptoms stop you from doing your normal things each day. Get help right away if: You are short of breath. This symptom may be an emergency. Get help right away. Call 911. Do not wait to see if the symptom will go away. Do not drive yourself to the   hospital. This information is not intended to replace advice given to you by your health care provider. Make sure you discuss any questions you have with your health care provider. Document Revised: 06/10/2022 Document Reviewed: 06/10/2022 Elsevier Patient Education  2024 Elsevier Inc.  

## 2023-10-13 NOTE — Progress Notes (Signed)
 Follow Up  Assessment and Plan:  Essential hypertension Controlled Continue bASA, bisoprolol -hctz,  Discussed DASH (Dietary Approaches to Stop Hypertension) DASH diet is lower in sodium than a typical American diet. Cut back on foods that are high in saturated fat, cholesterol, and trans fats. Eat more whole-grain foods, fish, poultry, and nuts Remain active and exercise as tolerated daily.  Monitor BP at home-Call if greater than 130/80.  Check CMP/CBC  Varicose veins of lower extremities with complications, bilateral/chronic venous insufficieny Ablation 04/2022 Continue bASA Vascular Surgery following, Dr. Eliza, PRN  Aortic atherosclerosis (HCC)- CT 06/27/2021/Hyperlipidemia Continue Fenofibrate , Omega 3, Rosuvastatin  Discussed lifestyle modifications. Recommended diet heavy in fruits and veggies, omega 3's. Decrease consumption of animal meats, cheeses, and dairy products. Remain active and exercise as tolerated. Continue to monitor. Check lipids  OSA on CPAP Continue CPAP.  Refer back to Sleep Study  Hepatic steatosis - CT 06/27/2021 Resolved for now.  Continue to monitor Check LFTs  Colon, diverticulosis/History of adenomatous polyp of colon Continue monitoring dietary intake of food triggers. High fiber diet. Stay well hydrated. Continue colonoscopy screening due 2027 May need to move up if symptoms fail to improve.  BPH with obstruction/lower urinary tract symptoms Controlled. Continue Proscar   Continue to monitor Check PSA   Vitamin D  deficiency At goal. Continue Vitamin D  Supplement Check Vitamin D  levels.  Abnormal glucose (prediabetes) Education: Reviewed 'ABCs' of diabetes management  Discussed goals to be met and/or maintained include A1C (<7) Blood pressure (<130/80) Cholesterol (LDL <70) Continue Eye Exam yearly  Continue Dental Exam Q6 mo Discussed dietary recommendations Discussed Physical Activity recommendations Check  A1C  Medication management All medications discussed and reviewed in full. All questions and concerns regarding medications addressed.    Overweight (BMI 25.0-29.9) Continue lifestyle modifications. Focus on consuming a diet heavy in fruits and veggies, omega 3's. Decrease consumption of animal meats, cheeses, and dairy products. Remain active and incorporate daily tolerated exercise. Continue to monitor weight loss.  Family history of abdominal aortic aneurysm (AAA) Abdominal US   Continue to monitor  Onychomycosis Completed Terbinafine  Mild improvement Refer to podiatry for further review and evaluation.  Ventral hernia No symptoms Continue to monitor  Palpitations Stay well hydrated, avoid caffeine, reduce stress, advised good sleep hygiene  Continue to monitor - discussed cardiac holter monitor if s/s fail to improve. Report to ER for any increase in stroke like symptoms, including HA, N/V, paralysis, difficulty speaking, trouble walking, confusion, vision changes, CP, heart palpitations, SOB, diaphoresis.  Orders Placed This Encounter  Procedures   CBC with Differential/Platelet   COMPLETE METABOLIC PANEL WITH GFR   Lipid panel   Hemoglobin A1c   Discussed med's effects and SE's. Screening labs and tests as requested with regular follow-up as recommended.  Over 30 minutes of exam, counseling, chart review, and complex, high level critical decision making was performed this visit.   Future Appointments  Date Time Provider Department Center  01/14/2024  2:45 PM Laurice President, NP [facility] None  02/12/2024  9:00 AM Cary No, NP [facility] None  07/08/2024  9:00 AM Laurice President, NP [facility] None    HPI  62 y.o. male  presents for a general follow up He has Essential hypertension; Hyperlipidemia, mixed; Vitamin D  deficiency; OSA on CPAP; Abnormal glucose (prediabetes); Medication management; Overweight (BMI 25.0-29.9); BPH with obstruction/lower urinary tract  symptoms; History of adenomatous polyp of colon; Varicose veins of lower extremities with complications, bilateral; Family history of abdominal aortic aneurysm (AAA); Aortic atherosclerosis (HCC)- CT 06/27/2021; Hepatic  steatosis - CT 06/27/2021; and Colon, diverticulosis on their problem list.  Overall he reports doing well.  He had Covid 05/29/23 and was seen in office 06/09/23 for lingering symptoms which have now resolved.  However, has noticed increase in heart palpitations over the las several months.  They are now diminishing.  Has not noticed as much in the last month.  Wears an iWatch and apple to record EKG readings.  Sates watch reports NSR however, has been able to feel a flutter.  Denies any other associated symptoms.   Has has of OSA with original sleep study being in [place], 2016.  He has noticed increase in daytime sleepiness.  Has not had his machine, or equipment checked since 2016.  Followed up with Vascular on 04/2022 and completed a successful  ablation on RLE.  He has had a successful ablation of the LLE in the past.     He has been taking Terbinafine  for tmt of Onychomycosis.  Not much improvement since completing course. Most notable 2nd digit right toe with is hard and thick and yellow.  BMI is Body mass index is 28.47 kg/m., he has been working on diet and exercise. Wt Readings from Last 3 Encounters:  10/14/23 215 lb 12.8 oz (97.9 kg)  09/19/23 214 lb 12.8 oz (97.4 kg)  08/13/23 214 lb 9.6 oz (97.3 kg)   His blood pressure has been controlled at home, today their BP is BP: (!) 158/80  He does workout. He denies chest pain, shortness of breath, dizziness.   He is concerned for large ventral hernia.  Denies pain, N/V, difficulty swallowing.  Has not had worked up or evaluated in the past.   He is on cholesterol medication and denies myalgias. Reports family hx of AAA, in father, Has never been a smoker.  His cholesterol  is at goal. The cholesterol last visit was:    Lab Results  Component Value Date   CHOL 180 07/09/2023   HDL 54 07/09/2023   LDLCALC 96 07/09/2023   TRIG 200 (H) 07/09/2023   CHOLHDL 3.3 07/09/2023    He has been working on diet and exercise for is not, he is on bASA, he is on ACE/ARB and denies polydipsia and polyuria. Last A1C in the office was:  Lab Results  Component Value Date   HGBA1C 6.1 (H) 07/09/2023   Last GFR: Lab Results  Component Value Date   EGFR 61 07/09/2023   Patient is on Vitamin D  supplement.   Lab Results  Component Value Date   VD25OH 41 07/09/2023     Last PSA was: Lab Results  Component Value Date   PSA 0.29 07/09/2023     Current Medications:  Current Outpatient Medications on File Prior to Visit  Medication Sig Dispense Refill   aspirin EC 81 MG tablet Take 81 mg by mouth daily. Swallow whole.     bisoprolol -hydrochlorothiazide  (ZIAC ) 5-6.25 MG tablet Take  1 tablet  Daily for BP                                            /                    TAKE 1 TABLET BY MOUTH 90 tablet 3   Cholecalciferol (VITAMIN D -3 PO) Take 10,000 Units by mouth daily.      Cinnamon 500  MG capsule Take 1,000 mg by mouth 2 (two) times daily.     fenofibrate  (TRICOR ) 145 MG tablet TAKE 1 TABLET BY MOUTH DAILY FOR TRIGLYCERIDES 90 tablet 2   finasteride  (PROSCAR ) 5 MG tablet Take 5 mg by mouth daily.     fluticasone  (FLONASE ) 50 MCG/ACT nasal spray SHAKE LIQUID AND USE 1 TO 2 SPRAYS IN EACH NOSTRIL 1 TO 2 TIMES PER DAY 48 g 3   Multiple Vitamin (MULTIVITAMIN) tablet Take 1 tablet by mouth daily.     Omega-3 Fatty Acids (FISH OIL) 1000 MG CPDR Take 2,000 mg by mouth 2 (two) times daily.     rosuvastatin  (CRESTOR ) 40 MG tablet Take 1 tablet (40 mg total) by mouth daily. 90 tablet 3   zinc gluconate 50 MG tablet Take 50 mg by mouth daily.     ciprofloxacin  (CIPRO ) 750 MG tablet Take 1 tablet (750 mg total) by mouth 2 (two) times daily. (Patient not taking: Reported on 10/14/2023) 14 tablet 0   ipratropium (ATROVENT )  0.03 % nasal spray Place 2 sprays into the nose 3 (three) times daily. (Patient not taking: Reported on 10/14/2023) 30 mL 2   metroNIDAZOLE  (FLAGYL ) 500 MG tablet Take 1 tablet 3 x /day with Meals for Diverticulitis (Patient not taking: Reported on 10/14/2023) 42 tablet 0   pantoprazole  (PROTONIX ) 40 MG tablet Take 1 tablet (40 mg total) by mouth daily. (Patient not taking: Reported on 10/14/2023) 14 tablet 0   No current facility-administered medications on file prior to visit.   Allergies:  No Known Allergies Medical History:  He has Essential hypertension; Hyperlipidemia, mixed; Vitamin D  deficiency; OSA on CPAP; Abnormal glucose (prediabetes); Medication management; Overweight (BMI 25.0-29.9); BPH with obstruction/lower urinary tract symptoms; History of adenomatous polyp of colon; Varicose veins of lower extremities with complications, bilateral; Family history of abdominal aortic aneurysm (AAA); Aortic atherosclerosis (HCC)- CT 06/27/2021; Hepatic steatosis - CT 06/27/2021; and Colon, diverticulosis on their problem list.  Health Maintenance:   Immunization History  Administered Date(s) Administered   Influenza Inj Mdck Quad Pf 08/10/2020   Influenza Inj Mdck Quad With Preservative 09/03/2017, 07/28/2018   Influenza Split 08/23/2014   Influenza Whole 08/09/2013   Influenza, Seasonal, Injecte, Preservative Fre 07/30/2016   Influenza-Unspecified 08/01/2019, 08/10/2020, 08/23/2021   PFIZER(Purple Top)SARS-COV-2 Vaccination 12/30/2019, 01/20/2020, 07/31/2020, 03/30/2021, 10/01/2021   PPD Test 08/23/2014, 10/18/2015, 01/29/2017, 04/09/2018, 05/10/2019, 05/17/2020   Pneumococcal Conjugate-13 08/23/2014   Tdap 01/17/2016   Zoster Recombinant(Shingrix) 09/12/2019, 11/20/2019   Health Maintenance  Topic Date Due   COVID-19 Vaccine (6 - 2024-25 season) 06/15/2023   DTaP/Tdap/Td (2 - Td or Tdap) 01/16/2026   Colonoscopy  07/28/2027   INFLUENZA VACCINE  Completed   Hepatitis C Screening   Completed   Zoster Vaccines- Shingrix  Completed   HPV VACCINES  Aged Out   HIV Screening  Discontinued    Patient Care Team: Tonita Fallow, MD as PCP - General (Internal Medicine)  Surgical History:  He has a past surgical history that includes Wisdom tooth extraction (1976); Nasal septum surgery; Incision and drainage perirectal abscess; Melanoma excision (2009); Hernia repair (Left, groin); Hernia repair (Right, groin); Shoulder arthroscopy (Left, 2003); melanona (2009); Colonoscopy (04/13/2010); Tissue graft; Cystoscopy/ureteroscopy/holmium laser/stent placement (Left, 07/13/2021); Transurethral resection of prostate (07/13/2021); Endovenous ablation saphenous vein w/ laser (Left, 08/08/2021); Incision and drainage perirectal abscess (1998); and Cystoscopy/ureteroscopy/holmium laser/stent placement (Left, 10/18/2022). Family History:  Hisfamily history includes AAA (abdominal aortic aneurysm) in his father and paternal aunt; Aneurysm (age of onset: 28) in  his mother; CAD in his father and paternal aunt; Heart attack in his paternal uncle; Kidney disease in his father; Kidney failure in his father; Prostate cancer in his father. Social History:  He reports that he has never smoked. He has never used smokeless tobacco. He reports that he does not drink alcohol and does not use drugs.  Review of Systems: Review of Systems  Constitutional: Negative.   HENT: Negative.    Eyes: Negative.   Respiratory: Negative.    Cardiovascular: Negative.   Gastrointestinal:  Positive for blood in stool. Negative for abdominal pain, constipation, diarrhea, heartburn, nausea and vomiting.  Genitourinary: Negative.   Musculoskeletal: Negative.   Skin: Negative.   Neurological: Negative.   Psychiatric/Behavioral: Negative.      Physical Exam: Estimated body mass index is 28.47 kg/m as calculated from the following:   Height as of this encounter: 6' 1 (1.854 m).   Weight as of this encounter: 215 lb  12.8 oz (97.9 kg). BP (!) 158/80   Pulse 62   Temp 98.5 F (36.9 C)   Ht 6' 1 (1.854 m)   Wt 215 lb 12.8 oz (97.9 kg)   SpO2 99%   BMI 28.47 kg/m   General Appearance: Well nourished, well developed, in no apparent distress.  Eyes: PERRLA, EOMs, conjunctiva no swelling or erythema, normal fundi and vessels.  Sinuses: No Frontal/maxillary tenderness  ENT/Mouth: Ext aud canals clear, normal light reflex with TMs without erythema, bulging. Good dentition. No erythema, swelling, or exudate on post pharynx. Tonsils not swollen or erythematous. Hearing normal.  Neck: Supple, thyroid  normal. No bruits  Respiratory: Respiratory effort normal, BS equal bilaterally without rales, rhonchi, wheezing or stridor.  Cardio: RRR without murmurs, rubs or gallops. Brisk peripheral pulses without edema.  Chest: symmetric, with normal excursions and percussion.  Abdomen: Soft, nontender, no guarding, rebound, hernias, masses, or organomegaly.  Lymphatics: Non tender without lymphadenopathy.  Musculoskeletal: Full ROM all peripheral extremities,5/5 strength, and normal gait.  Skin: Warm, dry without rashes, lesions, ecchymosis. Neuro: Cranial nerves intact, reflexes equal bilaterally. Normal muscle tone, no cerebellar symptoms. Sensation intact.  Psych: Awake and oriented X 3, normal affect, Insight and Judgment appropriate.  Genitourinary: Defer to Urology   BASCOM NECESSARY, NP 1:17 PM [facility]

## 2023-10-14 ENCOUNTER — Ambulatory Visit (INDEPENDENT_AMBULATORY_CARE_PROVIDER_SITE_OTHER): Payer: BC Managed Care – PPO | Admitting: Nurse Practitioner

## 2023-10-14 ENCOUNTER — Encounter: Payer: Self-pay | Admitting: Nurse Practitioner

## 2023-10-14 VITALS — BP 158/80 | HR 62 | Temp 98.5°F | Ht 73.0 in | Wt 215.8 lb

## 2023-10-14 DIAGNOSIS — G4733 Obstructive sleep apnea (adult) (pediatric): Secondary | ICD-10-CM | POA: Diagnosis not present

## 2023-10-14 DIAGNOSIS — R002 Palpitations: Secondary | ICD-10-CM

## 2023-10-14 DIAGNOSIS — I83893 Varicose veins of bilateral lower extremities with other complications: Secondary | ICD-10-CM | POA: Diagnosis not present

## 2023-10-14 DIAGNOSIS — Z8249 Family history of ischemic heart disease and other diseases of the circulatory system: Secondary | ICD-10-CM

## 2023-10-14 DIAGNOSIS — E782 Mixed hyperlipidemia: Secondary | ICD-10-CM

## 2023-10-14 DIAGNOSIS — I10 Essential (primary) hypertension: Secondary | ICD-10-CM | POA: Diagnosis not present

## 2023-10-14 DIAGNOSIS — E559 Vitamin D deficiency, unspecified: Secondary | ICD-10-CM

## 2023-10-14 DIAGNOSIS — R7309 Other abnormal glucose: Secondary | ICD-10-CM | POA: Diagnosis not present

## 2023-10-14 DIAGNOSIS — N138 Other obstructive and reflux uropathy: Secondary | ICD-10-CM

## 2023-10-14 DIAGNOSIS — K760 Fatty (change of) liver, not elsewhere classified: Secondary | ICD-10-CM

## 2023-10-14 DIAGNOSIS — K5730 Diverticulosis of large intestine without perforation or abscess without bleeding: Secondary | ICD-10-CM

## 2023-10-14 DIAGNOSIS — K439 Ventral hernia without obstruction or gangrene: Secondary | ICD-10-CM

## 2023-10-14 DIAGNOSIS — Z79899 Other long term (current) drug therapy: Secondary | ICD-10-CM

## 2023-10-14 DIAGNOSIS — N401 Enlarged prostate with lower urinary tract symptoms: Secondary | ICD-10-CM

## 2023-10-14 DIAGNOSIS — I700 Atherosclerosis of aorta: Secondary | ICD-10-CM | POA: Diagnosis not present

## 2023-10-14 DIAGNOSIS — Z860101 Personal history of adenomatous and serrated colon polyps: Secondary | ICD-10-CM

## 2023-10-14 DIAGNOSIS — B351 Tinea unguium: Secondary | ICD-10-CM

## 2023-10-14 DIAGNOSIS — E663 Overweight: Secondary | ICD-10-CM

## 2023-10-14 NOTE — Patient Instructions (Signed)
 Palpitations Palpitations are feelings that your heartbeat is not normal. Your heartbeat may feel like it is: Uneven (irregular). Faster than normal. Fluttering. Skipping a beat. This is usually not a serious problem. However, a doctor will do tests and check your medical history to make sure that you do not have a serious heart problem. Follow these instructions at home: Watch for any changes in your condition. Tell your doctor about any changes. Take these actions to help manage your symptoms: Eating and drinking Follow instructions from your doctor about things to eat and drink. You may be told to avoid these things: Drinks that have caffeine in them, such as coffee, tea, soft drinks, and energy drinks. Chocolate. Alcohol. Diet pills. Lifestyle     Try to lower your stress. These things can help you relax: Yoga. Deep breathing and meditation. Guided imagery. This is using words and images to create positive thoughts. Exercise, including swimming, jogging, and walking. Tell your doctor if you have more abnormal heartbeats when you are active. If you have chest pain or feel short of breath with exercise, do not keep doing the exercise until you are seen by your doctor. Biofeedback. This is using your mind to control things in your body, such as your heartbeat. Get plenty of rest and sleep. Keep a regular bed time. Do not use drugs, such as cocaine or ecstasy. Do not use marijuana. Do not smoke or use any products that contain nicotine or tobacco. If you need help quitting, ask your doctor. General instructions Take over-the-counter and prescription medicines only as told by your doctor. Keep all follow-up visits. You may need more tests if palpitations do not go away or get worse. Contact a doctor if: You keep having fast or uneven heartbeats for a long time. Your symptoms happen more often. Get help right away if: You have chest pain. You feel short of breath. You have a very  bad headache. You feel dizzy. You faint. These symptoms may be an emergency. Get help right away. Call your local emergency services (911 in the [place] Do not wait to see if the symptoms will go away. Do not drive yourself to the hospital. Summary Palpitations are feelings that your heartbeat is uneven or faster than normal. It may feel like your heart is fluttering or skipping a beat. Avoid food and drinks that may cause this condition. These include caffeine, chocolate, and alcohol. Try to lower your stress. Do not smoke or use drugs. Get help right away if you faint, feel dizzy, feel short of breath, have chest pain, or have a very bad headache. This information is not intended to replace advice given to you by your health care provider. Make sure you discuss any questions you have with your health care provider. Document Revised: 02/21/2021 Document Reviewed: 02/21/2021 Elsevier Patient Education  2024 [employer].

## 2023-10-15 LAB — CBC WITH DIFFERENTIAL/PLATELET
Absolute Lymphocytes: 1415 {cells}/uL (ref 850–3900)
Absolute Monocytes: 653 {cells}/uL (ref 200–950)
Basophils Absolute: 79 {cells}/uL (ref 0–200)
Basophils Relative: 1.3 %
Eosinophils Absolute: 92 {cells}/uL (ref 15–500)
Eosinophils Relative: 1.5 %
HCT: 43.6 % (ref 38.5–50.0)
Hemoglobin: 14.6 g/dL (ref 13.2–17.1)
MCH: 30.4 pg (ref 27.0–33.0)
MCHC: 33.5 g/dL (ref 32.0–36.0)
MCV: 90.8 fL (ref 80.0–100.0)
MPV: 9.5 fL (ref 7.5–12.5)
Monocytes Relative: 10.7 %
Neutro Abs: 3861 {cells}/uL (ref 1500–7800)
Neutrophils Relative %: 63.3 %
Platelets: 330 10*3/uL (ref 140–400)
RBC: 4.8 10*6/uL (ref 4.20–5.80)
RDW: 11.7 % (ref 11.0–15.0)
Total Lymphocyte: 23.2 %
WBC: 6.1 10*3/uL (ref 3.8–10.8)

## 2023-10-15 LAB — COMPLETE METABOLIC PANEL WITH GFR
AG Ratio: 1.8 (calc) (ref 1.0–2.5)
ALT: 24 U/L (ref 9–46)
AST: 23 U/L (ref 10–35)
Albumin: 4.6 g/dL (ref 3.6–5.1)
Alkaline phosphatase (APISO): 45 U/L (ref 35–144)
BUN/Creatinine Ratio: 20 (calc) (ref 6–22)
BUN: 26 mg/dL — ABNORMAL HIGH (ref 7–25)
CO2: 29 mmol/L (ref 20–32)
Calcium: 10.4 mg/dL — ABNORMAL HIGH (ref 8.6–10.3)
Chloride: 100 mmol/L (ref 98–110)
Creat: 1.29 mg/dL (ref 0.70–1.35)
Globulin: 2.5 g/dL (ref 1.9–3.7)
Glucose, Bld: 90 mg/dL (ref 65–99)
Potassium: 4.6 mmol/L (ref 3.5–5.3)
Sodium: 139 mmol/L (ref 135–146)
Total Bilirubin: 0.5 mg/dL (ref 0.2–1.2)
Total Protein: 7.1 g/dL (ref 6.1–8.1)
eGFR: 62 mL/min/{1.73_m2} (ref >=60)

## 2023-10-15 LAB — LIPID PANEL
Cholesterol: 182 mg/dL (ref <200)
HDL: 54 mg/dL (ref >=40)
LDL Cholesterol (Calc): 99 mg/dL
Non-HDL Cholesterol (Calc): 128 mg/dL (ref <130)
Total CHOL/HDL Ratio: 3.4 (calc) (ref <5.0)
Triglycerides: 196 mg/dL — ABNORMAL HIGH (ref <150)

## 2023-10-15 LAB — HEMOGLOBIN A1C
Hgb A1c MFr Bld: 6.2 %{Hb} — ABNORMAL HIGH (ref <5.7)
Mean Plasma Glucose: 131 mg/dL
eAG (mmol/L): 7.3 mmol/L

## 2023-12-02 ENCOUNTER — Telehealth: Payer: Self-pay

## 2023-12-02 NOTE — Telephone Encounter (Signed)
Copied from CRM [id number]. Topic: Appointments - Transfer of Care >> Dec 02, 2023 11:48 AM Orinda Kenner C wrote: Pt is requesting to transfer FROM: Dr. Lucky Cowboy Pt is requesting to transfer TO: PA, Alyssa Allwardt Reason for requested transfer: Dr. Lucky Cowboy passed away and needs a new pcp.  It is the responsibility of the team the patient would like to transfer to ( PA, Alyssa Allwardt) to reach out to the patient if for any reason this transfer is not acceptable.  Please see CRM msg regarding TOC from Dr Oneta Rack to our office and advise

## 2023-12-04 ENCOUNTER — Other Ambulatory Visit: Payer: Self-pay

## 2023-12-04 DIAGNOSIS — I10 Essential (primary) hypertension: Secondary | ICD-10-CM

## 2023-12-04 MED ORDER — BISOPROLOL-HYDROCHLOROTHIAZIDE 5-6.25 MG PO TABS: ORAL_TABLET | ORAL | 0 refills | Status: DC

## 2023-12-30 ENCOUNTER — Other Ambulatory Visit: Payer: Self-pay

## 2023-12-30 MED ORDER — ROSUVASTATIN CALCIUM 40 MG PO TABS: 40.0000 mg | ORAL_TABLET | Freq: Every day | ORAL | 0 refills | Status: DC

## 2024-01-14 ENCOUNTER — Ambulatory Visit: Payer: BC Managed Care – PPO | Admitting: Nurse Practitioner

## 2024-01-20 ENCOUNTER — Ambulatory Visit: Payer: BC Managed Care – PPO | Admitting: Physician Assistant

## 2024-01-20 ENCOUNTER — Encounter: Payer: Self-pay | Admitting: Physician Assistant

## 2024-01-20 VITALS — BP 138/76 | HR 69 | Temp 97.8°F | Ht 73.0 in | Wt 212.4 lb

## 2024-01-20 DIAGNOSIS — G4733 Obstructive sleep apnea (adult) (pediatric): Secondary | ICD-10-CM | POA: Diagnosis not present

## 2024-01-20 DIAGNOSIS — J309 Allergic rhinitis, unspecified: Secondary | ICD-10-CM

## 2024-01-20 DIAGNOSIS — Z8249 Family history of ischemic heart disease and other diseases of the circulatory system: Secondary | ICD-10-CM

## 2024-01-20 DIAGNOSIS — N401 Enlarged prostate with lower urinary tract symptoms: Secondary | ICD-10-CM

## 2024-01-20 DIAGNOSIS — E782 Mixed hyperlipidemia: Secondary | ICD-10-CM | POA: Diagnosis not present

## 2024-01-20 DIAGNOSIS — N138 Other obstructive and reflux uropathy: Secondary | ICD-10-CM

## 2024-01-20 DIAGNOSIS — R7303 Prediabetes: Secondary | ICD-10-CM | POA: Diagnosis not present

## 2024-01-20 DIAGNOSIS — I10 Essential (primary) hypertension: Secondary | ICD-10-CM | POA: Diagnosis not present

## 2024-01-20 LAB — POCT GLYCOSYLATED HEMOGLOBIN (HGB A1C): Hemoglobin A1C: 5.8 % — AB (ref 4.0–5.6)

## 2024-01-20 MED ORDER — FLUTICASONE PROPIONATE 50 MCG/ACT NA SUSP: NASAL | 3 refills | Status: AC

## 2024-01-20 MED ORDER — FENOFIBRATE 145 MG PO TABS: ORAL_TABLET | ORAL | 3 refills | Status: AC

## 2024-01-20 MED ORDER — BISOPROLOL-HYDROCHLOROTHIAZIDE 5-6.25 MG PO TABS: ORAL_TABLET | ORAL | 3 refills | Status: AC

## 2024-01-20 MED ORDER — ROSUVASTATIN CALCIUM 40 MG PO TABS: 40.0000 mg | ORAL_TABLET | Freq: Every day | ORAL | 3 refills | Status: AC

## 2024-01-20 NOTE — Progress Notes (Signed)
 Patient ID: Tony Bailey, male    DOB: 1961-07-16, 63 y.o.   MRN: [id number]   Assessment & Plan:  Essential hypertension -     Bisoprolol-hydroCHLOROthiazide; Take  1 tablet  Daily for BP                                            /                    TAKE 1 TABLET BY MOUTH  Dispense: 90 tablet; Refill: 3  Hyperlipidemia, mixed -     Fenofibrate; TAKE 1 TABLET BY MOUTH DAILY FOR TRIGLYCERIDES  Dispense: 90 tablet; Refill: 3 -     Rosuvastatin Calcium; Take 1 tablet (40 mg total) by mouth daily.  Dispense: 90 tablet; Refill: 3  OSA (obstructive sleep apnea)  Allergic rhinitis, unspecified seasonality, unspecified trigger -     Fluticasone Propionate; SHAKE LIQUID AND USE 1 TO 2 SPRAYS IN EACH NOSTRIL 1 TO 2 TIMES PER DAY  Dispense: 48 g; Refill: 3  Prediabetes -     POCT glycosylated hemoglobin (Hb A1C)  BPH with obstruction/lower urinary tract symptoms  Family history of abdominal aortic aneurysm (AAA)     Assessment & Plan Nasal Allergies Chronic nasal allergies managed with nightly use of Flonase to maintain open airways, especially important due to concurrent sleep apnea. - Continue nightly use of Flonase.  Benign Prostatic Hyperplasia (BPH) Managed with finasteride (Proscar). He is under the care of a urologist, Dr. Berneice Heinrich, for BPH management. - Continue finasteride (Proscar). - Follow up with urologist as needed.  Hypertension Well-controlled. Continue baby ASA. Bisoprolol-hydrochlorothiazide. DASH diet. - Continue current antihypertensive regimen.  Hyperlipidemia Managed with Crestor to maintain cholesterol levels. - Continue Crestor, Omega 3, Fenofibrate  Hypertriglyceridemia Chronic hypertriglyceridemia managed with fenofibrate (Tricor). Lipid levels were slightly elevated three months ago, indicating a history of higher levels prior to medication. The effectiveness of fenofibrate in maintaining triglyceride levels was discussed, with the understanding that  levels might be higher without it. - Continue fenofibrate (Tricor).  Prediabetes A1c improved from 6.2% three months ago to 5.8% today, indicating a positive trend. The potential modest benefit of cinnamon in lowering blood sugar was discussed, and continuation of this supplement was advised. AI-assisted review of literature suggests a modest reduction in fasting glucose with cinnamon supplementation. - Continue monitoring A1c levels. - Continue cinnamon supplement.  Family History of Abdominal Aortic Aneurysm (AAA) Family history of AAA. Previous screenings have shown no enlargement. Continued monitoring is advised due to family history.  General Health Maintenance He is up to date with annual eye exams, biannual dental visits, annual dermatology visits, and colonoscopy screenings. He is proactive in health maintenance and follows recommended screening schedules.  Follow-up Regular follow-up is planned to monitor health conditions and medication efficacy. Next physical is scheduled for October as a Welcome to Medicare visit, which will include an EKG due to Medicare guidelines. - Schedule Welcome to Medicare annual wellness visit and physical in October. - Include EKG in the upcoming Medicare wellness visit.      Return in about 6 months (around 07/21/2024) for Welcome to [facility] / Physical .    Subjective:    Chief Complaint  Patient presents with   New Patient (Initial Visit)    New pt in office to est care with PCP; pt  states he is needing refills of medications today;     HPI Discussed the use of AI scribe software for clinical note transcription with the patient, who gave verbal consent to proceed.  History of Present Illness Tony Bailey "Tony Bailey" is a 63 year old male who presents for medication refills and routine follow-up.  He is establishing care after the passing of his previous physician. He takes fenofibrate for high triglycerides, Flonase for nasal  allergies, aspirin, vitamin D, finasteride for prostate issues, BP medication, and Crestor for high cholesterol. He also takes cinnamon and zinc supplements. His A1c was 6.2% three months ago and is now 5.8%.  He has a history of high triglycerides and takes fenofibrate (Tricor) for this condition. He is unsure if the medication is effective but acknowledges that his numbers might be worse without it. His lipid panel was slightly elevated three months ago. He takes Crestor for high cholesterol and has been on fish oil for a long time. He questions the efficacy of supplements like cinnamon and zinc but continues to take them.  He takes a combination blood pressure medication & reports that his blood pressure is well-controlled. He uses Flonase every night for nasal allergies to keep his airway open due to sleep issues. He also takes aspirin and vitamin D over the counter.  He is under the care of a urologist, Dr. Berneice Heinrich, for prostate issues and takes finasteride (Proscar). He has a history of kidney stones, with a significant stone that was initially 10-11 mm and later grew to 14 mm before being treated.  He has a family history of abdominal aortic aneurysm (AAA). He has been screened for AAA with no enlargement noted so far. No CP or SOB. No concerning symptoms.      Past Medical History:  Diagnosis Date   Anesthesia complication    Pt stated that years ago when he was having surgery for a perirectal abcess his heart stopped. He stated that he was told it was due to the position he was in and the anesthesia going to his heart. He said they were able to get him back quickly. Since then he has had surgeries with no complications.   Anxiety    Basal cell carcinoma of skin of upper limb, including shoulder    Bleeding from varicose vein    Deviated septum    Diastasis recti    Diverticulitis    Enlarged prostate    Hernia of flank    History of kidney stones    Hyperlipidemia    Hypertension     Kidney stones    Melanoma (HCC) 2009   upper back mid right side   Multiple atypical skin moles    OSA (obstructive sleep apnea)    Other testicular hypofunction    Perirectal abscess    Perthes disease, left    Pre-diabetes    Prediabetes 05/09/2019   Sciatic nerve pain 2010   hx - no current problems per patient 07/06/20   Sciatic nerve pain    Sleep apnea    uses cpap nightly   Sleep apnea    Testosterone deficiency 10/13/2013   Unspecified vitamin D deficiency    Venous insufficiency    bilateral   Venous insufficiency     Past Surgical History:  Procedure Laterality Date   COLONOSCOPY  04/13/2010   [facility]    CYSTOSCOPY/URETEROSCOPY/HOLMIUM LASER/STENT PLACEMENT Left 07/13/2021   Procedure: CYSTOSCOPY BILATERAL RETROGRADE PYELOGRAM/CYSTOLITHALOPAXY;  Surgeon: Sebastian Ache, MD;  Location:  [facility];  Service: Urology;  Laterality: Left;   CYSTOSCOPY/URETEROSCOPY/HOLMIUM LASER/STENT PLACEMENT Left 10/18/2022   Procedure: CYSTOSCOPY LEFT RETROGRADE PYELOGRAM URETEROSCOPY/HOLMIUM LASER/STENT PLACEMENT;  Surgeon: Sebastian Ache, MD;  Location: [facility];  Service: Urology;  Laterality: Left;   ENDOVENOUS ABLATION SAPHENOUS VEIN W/ LASER Left 08/08/2021   endovenous laser ablation left greater saphenous veins and stab phlebectomy > 20 incisions left leg by Cari Caraway MD   HERNIA REPAIR Left groin   1970   HERNIA REPAIR Right groin   2011   INCISION AND DRAINAGE PERIRECTAL ABSCESS     x 2   INCISION AND DRAINAGE PERIRECTAL ABSCESS  1998   MELANOMA EXCISION  2009   melanona  2009   upper back -mid right side   NASAL SEPTUM SURGERY     SHOULDER ARTHROSCOPY Left 2003   TISSUE GRAFT     hx of gum grafts   TRANSURETHRAL RESECTION OF PROSTATE  07/13/2021   Procedure: TRANSURETHRAL RESECTION OF THE PROSTATE (TURP);  Surgeon: Sebastian Ache, MD;  Location: [facility];  Service: Urology;;   WISDOM TOOTH EXTRACTION  1976    Family  History  Problem Relation Age of Onset   Aneurysm Mother 46   Varicose Veins Mother    AAA (abdominal aortic aneurysm) Father    Kidney disease Father    Kidney failure Father    Prostate cancer Father    CAD Father    Cancer Father    COPD Father    Heart disease Father    AAA (abdominal aortic aneurysm) Paternal Aunt    CAD Paternal Aunt    Heart attack Paternal Uncle        Several paternal uncles with CAD hx   Cancer Paternal Aunt    Heart disease Paternal Aunt    Heart disease Paternal Uncle    Colon cancer Neg Hx    Stomach cancer Neg Hx    Rectal cancer Neg Hx     Social History   Tobacco Use   Smoking status: Never   Smokeless tobacco: Never  Vaping Use   Vaping status: Never Used  Substance Use Topics   Alcohol use: No   Drug use: No     No Known Allergies  Review of Systems NEGATIVE UNLESS OTHERWISE INDICATED IN HPI      Objective:     BP 138/76 (BP Location: Left Arm, Patient Position: Sitting, Cuff Size: Normal)   Pulse 69   Temp 97.8 F (36.6 C) (Temporal)   Ht 6\' 1"  (1.854 m)   Wt 212 lb 6.4 oz (96.3 kg)   SpO2 96%   BMI 28.02 kg/m   Wt Readings from Last 3 Encounters:  01/20/24 212 lb 6.4 oz (96.3 kg)  10/14/23 215 lb 12.8 oz (97.9 kg)  09/19/23 214 lb 12.8 oz (97.4 kg)    BP Readings from Last 3 Encounters:  01/20/24 138/76  10/14/23 (!) 158/80  09/19/23 130/80     Physical Exam Vitals and nursing note reviewed.  Constitutional:      Appearance: Normal appearance.  Eyes:     Extraocular Movements: Extraocular movements intact.     Conjunctiva/sclera: Conjunctivae normal.     Pupils: Pupils are equal, round, and reactive to light.  Cardiovascular:     Rate and Rhythm: Normal rate and regular rhythm.     Pulses: Normal pulses.     Heart sounds: No murmur heard. Pulmonary:     Effort: Pulmonary effort is normal.  Breath sounds: Normal breath sounds.  Skin:    General: Skin is warm.     Findings: No rash.   Neurological:     General: No focal deficit present.     Mental Status: He is alert and oriented to person, place, and time.  Psychiatric:        Mood and Affect: Mood normal.        Behavior: Behavior normal.           Evann Erazo M Marien Manship, PA-C

## 2024-02-11 NOTE — Progress Notes (Signed)
 Tony Bailey

## 2024-02-11 NOTE — Progress Notes (Signed)
 PATIENT: Tony Bailey DOB: 1961/02/19  REASON FOR VISIT: follow up HISTORY FROM: patient  Chief Complaint  Patient presents with   Follow-up    RM 1, alone. Last seen 02/13/23. OSA/CPAP f/u. Doing well. Tolerating CPAP. In last 6 months, notices     HISTORY OF PRESENT ILLNESS:  02/12/24 ALL:  Tony Bailey returns for follow up for OSA on CPAP. He continues to do well on therapy. He is using CPAP nightly for about 6 hours, on average. He uses nasal pillow mask. He does report more air leaking/noises when sleeping on his back. He is considering mouth tape. No concerns with machine or supplies. BP usually well managed. He is followed by PCP regularly.     02/13/2023 ALL:  Tony Bailey is a 63 y.o. male here today for follow up for OSA on CPAP.  He was seen in consult with Dr Albertina Hugger 07/2022. He had used CPAP therapy since 2006 and in need of new machine. Split night 10/2022 showed mild OSA managed well with CPAP at 6cmH20. AutoPAP ordered with pressure range of 5-8cmH20. He reports doing fairly well. He is adjusting to the new machine. He is using a nasal pillow mask. He is having more dry mouth then with previous machine. He feels symptoms are mild. Keeping water at the bedside helps. No other concerns with machine or supplies.     HISTORY: (copied from Dr Dohmeier's previous note)  Tony Bailey is a 63 y.o. year old White or Caucasian male patient seen here as a referral on 08/07/2022 from Dr York Henri, MD - and his NP, for a sleep consultation. .  Chief concern according to patient :  patient uses a CPAP since 2006, still on his first machine.[facility] [employer].    Tony Bailey   has a past medical history of Anesthesia complication, Anxiety, Bleeding from varicose vein, Hyperlipidemia, Hypertension, Kidney stones, Melanoma (HCC) (2009), OSA (obstructive sleep apnea),  Prediabetes (05/09/2019), Sciatic nerve pain (2010), Testosterone  deficiency (10/13/2013), Unspecified  vitamin D  deficiency, and Venous insufficiency.   The patient had the first sleep study in the year 2006, the physician had left the community and so the patient had no follow up. [employer] gives supplies.  Sleep relevant medical history:  deviated septum repair, prostate ectomy TURP, Kidney stones,     Family medical /sleep history: father likely had OSA- passed at 20 , in 2010- CHF, CAD, Anemia,  ESRD.    Social history:  Patient is working as a Public librarian- and lives in a household with spouse and dog-  no children.  The patient currently works daytime.  Tobacco use; none .  ETOH use - none   Caffeine intake in form of Coffee( 1-2 ) Soda( diet 1-2 day) Tea ( 1  day) -no energy drinks. Regular exercise in form of  walking, gardening. .     Sleep habits are as follows: The patient's dinner time is between 5.30-6 PM. The patient goes to bed at 11 PM but he may have slept already in front of the TV. He  continues to sleep for 7 hours, wakes for one bathroom breaks, the first time at 2-3 AM.   The preferred sleep position is left laterally, with the support of 1 pillow, flat bed-. Dreams are reportedly frequent/vivid.  6.45 AM is the usual rise time. The patient wakes up at 6. 20 with an alarm.  He reports  feeling refreshed/ restored in AM, as long as  he uses his CPAP- before CPAP he had symptoms such as dry mouth/ drooling, vertigo, loud snoring, no morning headaches but  residual fatigue.  Naps are taken infrequently, lasting from 15 to 30 minutes in late PM, in front of the TV.   REVIEW OF SYSTEMS: Out of a complete 14 system review of symptoms, the patient complains only of the following symptoms, dry mouth and all other reviewed systems are negative.  ESS: 7/24  ALLERGIES: No Known Allergies  HOME MEDICATIONS: Outpatient Medications Prior to Visit  Medication Sig Dispense Refill   aspirin EC 81 MG tablet Take 81 mg by mouth daily. Swallow whole.     bisoprolol -hydrochlorothiazide   (ZIAC ) 5-6.25 MG tablet Take  1 tablet  Daily for BP                                            /                    TAKE 1 TABLET BY MOUTH 90 tablet 3   Cholecalciferol (VITAMIN D -3 PO) Take 10,000 Units by mouth daily.      Cinnamon 500 MG capsule Take 1,000 mg by mouth 2 (two) times daily.     fenofibrate  (TRICOR ) 145 MG tablet TAKE 1 TABLET BY MOUTH DAILY FOR TRIGLYCERIDES 90 tablet 3   finasteride  (PROSCAR ) 5 MG tablet Take 5 mg by mouth daily.     fluticasone  (FLONASE ) 50 MCG/ACT nasal spray SHAKE LIQUID AND USE 1 TO 2 SPRAYS IN EACH NOSTRIL 1 TO 2 TIMES PER DAY 48 g 3   levocetirizine (XYZAL) 5 MG tablet Take 5 mg by mouth every evening. (Patient not taking: Reported on 01/20/2024)     Multiple Vitamin (MULTIVITAMIN) tablet Take 1 tablet by mouth daily.     Omega-3 Fatty Acids (FISH OIL) 1000 MG CPDR Take 2,000 mg by mouth 2 (two) times daily.     rosuvastatin  (CRESTOR ) 40 MG tablet Take 1 tablet (40 mg total) by mouth daily. 90 tablet 3   zinc gluconate 50 MG tablet Take 50 mg by mouth daily.     No facility-administered medications prior to visit.    PAST MEDICAL HISTORY: Past Medical History:  Diagnosis Date   Anesthesia complication    Pt stated that years ago when he was having surgery for a perirectal abcess his heart stopped. He stated that he was told it was due to the position he was in and the anesthesia going to his heart. He said they were able to get him back quickly. Since then he has had surgeries with no complications.   Anxiety    Basal cell carcinoma of skin of upper limb, including shoulder    Bleeding from varicose vein    Deviated septum    Diastasis recti    Diverticulitis    Enlarged prostate    Hernia of flank    History of kidney stones    Hyperlipidemia    Hypertension    Kidney stones    Melanoma (HCC) 2009   upper back mid right side   Multiple atypical skin moles    OSA (obstructive sleep apnea)    Other testicular hypofunction    Perirectal  abscess    Perthes disease, left    Pre-diabetes    Prediabetes 05/09/2019   Sciatic nerve pain 2010   hx - no  current problems per patient 07/06/20   Sciatic nerve pain    Sleep apnea    uses cpap nightly   Sleep apnea    Testosterone  deficiency 10/13/2013   Unspecified vitamin D  deficiency    Venous insufficiency    bilateral   Venous insufficiency     PAST SURGICAL HISTORY: Past Surgical History:  Procedure Laterality Date   COLONOSCOPY  04/13/2010   [facility]    CYSTOSCOPY/URETEROSCOPY/HOLMIUM LASER/STENT PLACEMENT Left 07/13/2021   Procedure: CYSTOSCOPY BILATERAL RETROGRADE PYELOGRAM/CYSTOLITHALOPAXY;  Surgeon: Osborn Blaze, MD;  Location: [facility];  Service: Urology;  Laterality: Left;   CYSTOSCOPY/URETEROSCOPY/HOLMIUM LASER/STENT PLACEMENT Left 10/18/2022   Procedure: CYSTOSCOPY LEFT RETROGRADE PYELOGRAM URETEROSCOPY/HOLMIUM LASER/STENT PLACEMENT;  Surgeon: Osborn Blaze, MD;  Location: [facility];  Service: Urology;  Laterality: Left;   ENDOVENOUS ABLATION SAPHENOUS VEIN W/ LASER Left 08/08/2021   endovenous laser ablation left greater saphenous veins and stab phlebectomy > 20 incisions left leg by Kirtland Perfect MD   HERNIA REPAIR Left groin   1970   HERNIA REPAIR Right groin   2011   INCISION AND DRAINAGE PERIRECTAL ABSCESS     x 2   INCISION AND DRAINAGE PERIRECTAL ABSCESS  1998   MELANOMA EXCISION  2009   melanona  2009   upper back -mid right side   NASAL SEPTUM SURGERY     SHOULDER ARTHROSCOPY Left 2003   TISSUE GRAFT     hx of gum grafts   TRANSURETHRAL RESECTION OF PROSTATE  07/13/2021   Procedure: TRANSURETHRAL RESECTION OF THE PROSTATE (TURP);  Surgeon: Osborn Blaze, MD;  Location: [facility];  Service: Urology;;   WISDOM TOOTH EXTRACTION  1976    FAMILY HISTORY: Family History  Problem Relation Age of Onset   Aneurysm Mother 6   Varicose Veins Mother    AAA (abdominal aortic aneurysm) Father    Kidney disease  Father    Kidney failure Father    Prostate cancer Father    CAD Father    Cancer Father    COPD Father    Heart disease Father    AAA (abdominal aortic aneurysm) Paternal Aunt    CAD Paternal Aunt    Heart attack Paternal Uncle        Several paternal uncles with CAD hx   Cancer Paternal Aunt    Heart disease Paternal Aunt    Heart disease Paternal Uncle    Colon cancer Neg Hx    Stomach cancer Neg Hx    Rectal cancer Neg Hx     SOCIAL HISTORY: Social History   Socioeconomic History   Marital status: Married    Spouse name: Not on file   Number of children: Not on file   Years of education: Not on file   Highest education level: Bachelor's degree (e.g., BA, AB, BS)  Occupational History   Not on file  Tobacco Use   Smoking status: Never   Smokeless tobacco: Never  Vaping Use   Vaping status: Never Used  Substance and Sexual Activity   Alcohol use: No   Drug use: No   Sexual activity: Yes    Partners: Female    Birth control/protection: None  Other Topics Concern   Not on file  Social History Narrative   Not on file   Social Drivers of Health   Financial Resource Strain: Low Risk  (01/16/2024)   Overall Financial Resource Strain (CARDIA)    Difficulty of Paying Living Expenses: Not hard at all  Food Insecurity: No Food Insecurity (01/16/2024)   Hunger Vital Sign    Worried About Running Out of Food in the Last Year: Never true    Ran Out of Food in the Last Year: Never true  Transportation Needs: No Transportation Needs (01/16/2024)   PRAPARE - Administrator, Civil Service (Medical): No    Lack of Transportation (Non-Medical): No  Physical Activity: Insufficiently Active (01/16/2024)   Exercise Vital Sign    Days of Exercise per Week: 3 days    Minutes of Exercise per Session: 20 min  Stress: No Stress Concern Present (01/16/2024)   [employer] of Occupational Health - Occupational Stress Questionnaire    Feeling of Stress : Not at all  Social  Connections: Moderately Isolated (01/16/2024)   Social Connection and Isolation Panel [NHANES]    Frequency of Communication with Friends and Family: Once a week    Frequency of Social Gatherings with Friends and Family: Never    Attends Religious Services: 1 to 4 times per year    Active Member of [employer] or Organizations: No    Attends Banker Meetings: Not on file    Marital Status: Married  Intimate Partner Violence: Not on file     PHYSICAL EXAM  Vitals:   02/12/24 0853 02/12/24 0900 02/12/24 0905  BP: (!) 143/90 (!) 142/80 (!) 142/90  Pulse: 73    Weight: 212 lb 9.6 oz (96.4 kg)    Height: 6\' 1"  (1.854 m)      Body mass index is 28.05 kg/m.  Generalized: Well developed, in no acute distress  Cardiology: normal rate and rhythm, no murmur noted Respiratory: clear to auscultation bilaterally  Neurological examination  Mentation: Alert oriented to time, place, history taking. Follows all commands speech and language fluent Cranial nerve II-XII: Pupils were equal round reactive to light. Extraocular movements were full, visual field were full  Motor: The motor testing reveals 5 over 5 strength of all 4 extremities. Good symmetric motor tone is noted throughout.  Gait and station: Gait is normal.    DIAGNOSTIC DATA (LABS, IMAGING, TESTING) - I reviewed patient records, labs, notes, testing and imaging myself where available.      No data to display           Lab Results  Component Value Date   WBC 6.1 10/14/2023   HGB 14.6 10/14/2023   HCT 43.6 10/14/2023   MCV 90.8 10/14/2023   PLT 330 10/14/2023      Component Value Date/Time   NA 139 10/14/2023 1155   K 4.6 10/14/2023 1155   CL 100 10/14/2023 1155   CO2 29 10/14/2023 1155   GLUCOSE 90 10/14/2023 1155   BUN 26 (H) 10/14/2023 1155   CREATININE 1.29 10/14/2023 1155   CALCIUM  10.4 (H) 10/14/2023 1155   PROT 7.1 10/14/2023 1155   ALBUMIN 4.5 05/20/2017 1555   AST 23 10/14/2023 1155   ALT 24  10/14/2023 1155   ALKPHOS 60 05/20/2017 1555   BILITOT 0.5 10/14/2023 1155   GFRNONAA 47 (L) 10/11/2022 1052   GFRNONAA 61 03/26/2021 1632   GFRAA 71 03/26/2021 1632   Lab Results  Component Value Date   CHOL 182 10/14/2023   HDL 54 10/14/2023   LDLCALC 99 10/14/2023   TRIG 196 (H) 10/14/2023   CHOLHDL 3.4 10/14/2023   Lab Results  Component Value Date   HGBA1C 5.8 (A) 01/20/2024   Lab Results  Component Value Date   VITAMINB12 466  05/17/2020   Lab Results  Component Value Date   TSH 2.48 07/09/2023     ASSESSMENT AND PLAN 63 y.o. year old male  has a past medical history of Anesthesia complication, Anxiety, Basal cell carcinoma of skin of upper limb, including shoulder, Bleeding from varicose vein, Deviated septum, Diastasis recti, Diverticulitis, Enlarged prostate, Hernia of flank, History of kidney stones, Hyperlipidemia, Hypertension, Kidney stones, Melanoma (HCC) (2009), Multiple atypical skin moles, OSA (obstructive sleep apnea), Other testicular hypofunction, Perirectal abscess, Perthes disease, left, Pre-diabetes, Prediabetes (05/09/2019), Sciatic nerve pain (2010), Sciatic nerve pain, Sleep apnea, Sleep apnea, Testosterone  deficiency (10/13/2013), Unspecified vitamin D  deficiency, Venous insufficiency, and Venous insufficiency. here with     ICD-10-CM   1. OSA on CPAP  G47.33 For home use only DME continuous positive airway pressure (CPAP)      Tony Bailey is doing well on CPAP therapy. Compliance report reveals excellent compliance. He was encouraged to continue using CPAP nightly and for greater than 4 hours each night. He may consider adjusting humidity on autoPAP, use chin strap or CPAP tape and or trial of Biotene products OTC. We will update supply orders as indicated. Risks of untreated sleep apnea review and education materials provided. He was encouraged to monitor BP at home. Healthy lifestyle habits encouraged. He will follow up in 1 year , sooner if  needed. He verbalizes understanding and agreement with this plan.    Orders Placed This Encounter  Procedures   For home use only DME continuous positive airway pressure (CPAP)    Heated Humidity with all supplies as needed    Length of Need:   Lifetime    Patient has OSA or probable OSA:   Yes    Is the patient currently using CPAP in the home:   Yes    Settings:   Other see comments    CPAP supplies needed:   Mask, headgear, cushions, filters, heated tubing and water chamber     No orders of the defined types were placed in this encounter.     Terrilyn Fick, FNP-C 02/12/2024, 9:19 AM [facility] [address], [address] [place], [place] [address] [phone]

## 2024-02-11 NOTE — Patient Instructions (Signed)

## 2024-02-12 ENCOUNTER — Ambulatory Visit: Payer: BC Managed Care – PPO | Admitting: Family Medicine

## 2024-02-12 ENCOUNTER — Encounter: Payer: Self-pay | Admitting: Family Medicine

## 2024-02-12 VITALS — BP 142/90 | HR 73 | Ht 73.0 in | Wt 212.6 lb

## 2024-02-12 DIAGNOSIS — G4733 Obstructive sleep apnea (adult) (pediatric): Secondary | ICD-10-CM | POA: Diagnosis not present

## 2024-07-08 ENCOUNTER — Encounter: Payer: BC Managed Care – PPO | Admitting: Nurse Practitioner

## 2024-07-21 ENCOUNTER — Encounter: Admitting: Physician Assistant

## 2025-02-21 ENCOUNTER — Ambulatory Visit: Admitting: Family Medicine
